# Patient Record
Sex: Female | Born: 1935 | Race: White | Hispanic: No | Smoking: Never smoker
Health system: Southern US, Community
[De-identification: ages and names within clinical notes are randomized; demographics above are authoritative.]

## PROBLEM LIST (undated history)

## (undated) DIAGNOSIS — J90 Pleural effusion, not elsewhere classified: Secondary | ICD-10-CM

## (undated) DIAGNOSIS — C50919 Malignant neoplasm of unspecified site of unspecified female breast: Secondary | ICD-10-CM

## (undated) DIAGNOSIS — E785 Hyperlipidemia, unspecified: Secondary | ICD-10-CM

## (undated) DIAGNOSIS — I10 Essential (primary) hypertension: Secondary | ICD-10-CM

## (undated) DIAGNOSIS — E039 Hypothyroidism, unspecified: Secondary | ICD-10-CM

## (undated) DIAGNOSIS — I4891 Unspecified atrial fibrillation: Secondary | ICD-10-CM

## (undated) DIAGNOSIS — I509 Heart failure, unspecified: Secondary | ICD-10-CM

## (undated) DIAGNOSIS — R911 Solitary pulmonary nodule: Secondary | ICD-10-CM

## (undated) HISTORY — PX: OTHER SURGICAL HISTORY: SHX169

## (undated) HISTORY — PX: JOINT REPLACEMENT: SHX530

## (undated) HISTORY — PX: FRACTURE SURGERY: SHX138

## (undated) HISTORY — DX: Hyperlipidemia, unspecified: E78.5

---

## 1997-09-01 HISTORY — PX: OTHER SURGICAL HISTORY: SHX169

## 1998-09-11 ENCOUNTER — Ambulatory Visit (HOSPITAL_COMMUNITY): Admission: RE | Admit: 1998-09-11 | Discharge: 1998-09-11 | Payer: Self-pay | Admitting: General Surgery

## 1998-09-12 ENCOUNTER — Encounter: Payer: Self-pay | Admitting: General Surgery

## 1998-09-13 ENCOUNTER — Inpatient Hospital Stay (HOSPITAL_COMMUNITY): Admission: RE | Admit: 1998-09-13 | Discharge: 1998-09-14 | Payer: Self-pay | Admitting: General Surgery

## 1998-10-19 ENCOUNTER — Encounter: Admission: RE | Admit: 1998-10-19 | Discharge: 1998-11-21 | Payer: Self-pay | Admitting: Hematology and Oncology

## 1998-10-20 ENCOUNTER — Encounter: Admission: RE | Admit: 1998-10-20 | Discharge: 1999-01-18 | Payer: Self-pay | Admitting: Radiation Oncology

## 1999-06-13 ENCOUNTER — Ambulatory Visit (HOSPITAL_COMMUNITY): Admission: RE | Admit: 1999-06-13 | Discharge: 1999-06-13 | Payer: Self-pay | Admitting: *Deleted

## 1999-06-13 ENCOUNTER — Encounter (INDEPENDENT_AMBULATORY_CARE_PROVIDER_SITE_OTHER): Payer: Self-pay | Admitting: Specialist

## 1999-08-06 ENCOUNTER — Encounter: Admission: RE | Admit: 1999-08-06 | Discharge: 1999-08-06 | Payer: Self-pay | Admitting: General Surgery

## 1999-08-06 ENCOUNTER — Encounter: Payer: Self-pay | Admitting: General Surgery

## 1999-11-19 ENCOUNTER — Encounter: Admission: RE | Admit: 1999-11-19 | Discharge: 1999-11-19 | Payer: Self-pay | Admitting: General Surgery

## 1999-11-19 ENCOUNTER — Encounter: Payer: Self-pay | Admitting: General Surgery

## 1999-11-20 ENCOUNTER — Encounter (INDEPENDENT_AMBULATORY_CARE_PROVIDER_SITE_OTHER): Payer: Self-pay | Admitting: *Deleted

## 1999-11-20 ENCOUNTER — Encounter: Payer: Self-pay | Admitting: General Surgery

## 1999-11-20 ENCOUNTER — Ambulatory Visit (HOSPITAL_BASED_OUTPATIENT_CLINIC_OR_DEPARTMENT_OTHER): Admission: RE | Admit: 1999-11-20 | Discharge: 1999-11-20 | Payer: Self-pay | Admitting: General Surgery

## 2000-03-13 ENCOUNTER — Encounter: Admission: RE | Admit: 2000-03-13 | Discharge: 2000-04-08 | Payer: Self-pay | Admitting: *Deleted

## 2000-08-26 ENCOUNTER — Encounter: Payer: Self-pay | Admitting: *Deleted

## 2000-08-26 ENCOUNTER — Encounter: Admission: RE | Admit: 2000-08-26 | Discharge: 2000-08-26 | Payer: Self-pay | Admitting: *Deleted

## 2001-12-30 ENCOUNTER — Encounter: Payer: Self-pay | Admitting: Obstetrics and Gynecology

## 2001-12-30 ENCOUNTER — Encounter: Admission: RE | Admit: 2001-12-30 | Discharge: 2001-12-30 | Payer: Self-pay | Admitting: Obstetrics and Gynecology

## 2001-12-31 ENCOUNTER — Ambulatory Visit (HOSPITAL_BASED_OUTPATIENT_CLINIC_OR_DEPARTMENT_OTHER): Admission: RE | Admit: 2001-12-31 | Discharge: 2001-12-31 | Payer: Self-pay | Admitting: Obstetrics and Gynecology

## 2001-12-31 ENCOUNTER — Encounter (INDEPENDENT_AMBULATORY_CARE_PROVIDER_SITE_OTHER): Payer: Self-pay

## 2002-07-20 ENCOUNTER — Encounter: Admission: RE | Admit: 2002-07-20 | Discharge: 2002-07-20 | Payer: Self-pay | Admitting: Family Medicine

## 2002-07-20 ENCOUNTER — Encounter: Payer: Self-pay | Admitting: Family Medicine

## 2002-08-02 ENCOUNTER — Ambulatory Visit (HOSPITAL_COMMUNITY): Admission: RE | Admit: 2002-08-02 | Discharge: 2002-08-02 | Payer: Self-pay | Admitting: *Deleted

## 2002-08-02 ENCOUNTER — Encounter (INDEPENDENT_AMBULATORY_CARE_PROVIDER_SITE_OTHER): Payer: Self-pay | Admitting: *Deleted

## 2002-10-04 ENCOUNTER — Encounter: Payer: Self-pay | Admitting: Family Medicine

## 2002-10-04 ENCOUNTER — Encounter: Admission: RE | Admit: 2002-10-04 | Discharge: 2002-10-04 | Payer: Self-pay | Admitting: Family Medicine

## 2003-02-07 ENCOUNTER — Encounter: Admission: RE | Admit: 2003-02-07 | Discharge: 2003-02-07 | Payer: Self-pay | Admitting: Family Medicine

## 2003-02-07 ENCOUNTER — Encounter: Payer: Self-pay | Admitting: Family Medicine

## 2003-02-28 ENCOUNTER — Inpatient Hospital Stay (HOSPITAL_COMMUNITY): Admission: RE | Admit: 2003-02-28 | Discharge: 2003-03-02 | Payer: Self-pay | Admitting: Cardiothoracic Surgery

## 2003-02-28 ENCOUNTER — Encounter: Payer: Self-pay | Admitting: Cardiothoracic Surgery

## 2003-03-01 ENCOUNTER — Encounter: Payer: Self-pay | Admitting: Cardiothoracic Surgery

## 2003-03-02 ENCOUNTER — Encounter: Payer: Self-pay | Admitting: Cardiothoracic Surgery

## 2003-03-07 ENCOUNTER — Ambulatory Visit (HOSPITAL_COMMUNITY): Admission: RE | Admit: 2003-03-07 | Discharge: 2003-03-07 | Payer: Self-pay | Admitting: Interventional Radiology

## 2003-06-16 ENCOUNTER — Encounter: Admission: RE | Admit: 2003-06-16 | Discharge: 2003-06-16 | Payer: Self-pay | Admitting: Cardiothoracic Surgery

## 2003-09-02 ENCOUNTER — Ambulatory Visit (HOSPITAL_COMMUNITY): Admission: RE | Admit: 2003-09-02 | Discharge: 2003-09-02 | Payer: Self-pay | Admitting: Cardiothoracic Surgery

## 2003-11-15 ENCOUNTER — Ambulatory Visit (HOSPITAL_COMMUNITY): Admission: RE | Admit: 2003-11-15 | Discharge: 2003-11-15 | Payer: Self-pay | Admitting: Oncology

## 2003-11-22 ENCOUNTER — Ambulatory Visit (HOSPITAL_COMMUNITY): Admission: RE | Admit: 2003-11-22 | Discharge: 2003-11-22 | Payer: Self-pay | Admitting: Oncology

## 2004-02-10 ENCOUNTER — Ambulatory Visit (HOSPITAL_COMMUNITY): Admission: RE | Admit: 2004-02-10 | Discharge: 2004-02-10 | Payer: Self-pay | Admitting: Oncology

## 2004-07-02 ENCOUNTER — Encounter: Admission: RE | Admit: 2004-07-02 | Discharge: 2004-07-02 | Payer: Self-pay | Admitting: Family Medicine

## 2004-08-06 ENCOUNTER — Ambulatory Visit: Payer: Self-pay | Admitting: Oncology

## 2004-09-10 ENCOUNTER — Ambulatory Visit (HOSPITAL_COMMUNITY): Admission: RE | Admit: 2004-09-10 | Discharge: 2004-09-10 | Payer: Self-pay | Admitting: Oncology

## 2004-11-02 ENCOUNTER — Ambulatory Visit: Payer: Self-pay | Admitting: Oncology

## 2004-12-03 ENCOUNTER — Ambulatory Visit (HOSPITAL_COMMUNITY): Admission: RE | Admit: 2004-12-03 | Discharge: 2004-12-03 | Payer: Self-pay | Admitting: Oncology

## 2005-03-01 ENCOUNTER — Ambulatory Visit: Payer: Self-pay | Admitting: Oncology

## 2005-05-31 ENCOUNTER — Ambulatory Visit: Payer: Self-pay | Admitting: Oncology

## 2005-08-19 ENCOUNTER — Ambulatory Visit (HOSPITAL_COMMUNITY): Admission: RE | Admit: 2005-08-19 | Discharge: 2005-08-19 | Payer: Self-pay | Admitting: Oncology

## 2005-09-02 ENCOUNTER — Ambulatory Visit: Payer: Self-pay | Admitting: Oncology

## 2005-10-07 ENCOUNTER — Ambulatory Visit (HOSPITAL_COMMUNITY): Admission: RE | Admit: 2005-10-07 | Discharge: 2005-10-07 | Payer: Self-pay | Admitting: Oncology

## 2006-01-24 ENCOUNTER — Ambulatory Visit: Payer: Self-pay | Admitting: Oncology

## 2006-05-27 ENCOUNTER — Ambulatory Visit: Payer: Self-pay | Admitting: Oncology

## 2006-05-29 ENCOUNTER — Ambulatory Visit (HOSPITAL_COMMUNITY): Admission: RE | Admit: 2006-05-29 | Discharge: 2006-05-29 | Payer: Self-pay | Admitting: Oncology

## 2006-05-29 LAB — CANCER ANTIGEN 27.29: CA 27.29: 46 U/mL — ABNORMAL HIGH (ref 0–39)

## 2006-11-27 ENCOUNTER — Ambulatory Visit: Payer: Self-pay | Admitting: Oncology

## 2007-05-27 ENCOUNTER — Ambulatory Visit (HOSPITAL_COMMUNITY): Admission: RE | Admit: 2007-05-27 | Discharge: 2007-05-27 | Payer: Self-pay | Admitting: Oncology

## 2007-05-28 ENCOUNTER — Ambulatory Visit: Payer: Self-pay | Admitting: Oncology

## 2007-09-22 ENCOUNTER — Encounter: Admission: RE | Admit: 2007-09-22 | Discharge: 2007-09-22 | Payer: Self-pay | Admitting: Family Medicine

## 2007-09-24 ENCOUNTER — Ambulatory Visit: Payer: Self-pay | Admitting: Oncology

## 2007-10-12 ENCOUNTER — Inpatient Hospital Stay (HOSPITAL_COMMUNITY): Admission: AD | Admit: 2007-10-12 | Discharge: 2007-10-16 | Payer: Self-pay | Admitting: Internal Medicine

## 2007-11-24 ENCOUNTER — Ambulatory Visit (HOSPITAL_COMMUNITY): Admission: RE | Admit: 2007-11-24 | Discharge: 2007-11-24 | Payer: Self-pay | Admitting: Family Medicine

## 2007-12-09 ENCOUNTER — Ambulatory Visit: Payer: Self-pay | Admitting: Oncology

## 2008-06-03 ENCOUNTER — Encounter: Admission: RE | Admit: 2008-06-03 | Discharge: 2008-06-03 | Payer: Self-pay | Admitting: Family Medicine

## 2008-06-09 ENCOUNTER — Ambulatory Visit: Payer: Self-pay | Admitting: Oncology

## 2008-12-08 ENCOUNTER — Ambulatory Visit: Payer: Self-pay | Admitting: Oncology

## 2009-06-09 ENCOUNTER — Ambulatory Visit: Payer: Self-pay | Admitting: Oncology

## 2009-11-27 ENCOUNTER — Emergency Department (HOSPITAL_BASED_OUTPATIENT_CLINIC_OR_DEPARTMENT_OTHER): Admission: EM | Admit: 2009-11-27 | Discharge: 2009-11-27 | Payer: Self-pay | Admitting: Emergency Medicine

## 2009-12-22 ENCOUNTER — Ambulatory Visit: Payer: Self-pay | Admitting: Oncology

## 2009-12-26 LAB — CANCER ANTIGEN 27.29: CA 27.29: 22 U/mL (ref 0–39)

## 2010-08-18 ENCOUNTER — Encounter: Payer: Self-pay | Admitting: Cardiothoracic Surgery

## 2010-09-25 ENCOUNTER — Encounter (HOSPITAL_BASED_OUTPATIENT_CLINIC_OR_DEPARTMENT_OTHER): Payer: Medicare Other | Admitting: Oncology

## 2010-09-25 DIAGNOSIS — C50919 Malignant neoplasm of unspecified site of unspecified female breast: Secondary | ICD-10-CM

## 2010-12-11 NOTE — H&P (Signed)
NAME:  Leah Guerra, Leah Guerra NO.:  [id number]   MEDICAL RECORD NO.:  [id number]          PATIENT TYPE:  INP   LOCATION:  1613                         FACILITY:  [facility]   PHYSICIAN:  Ramiro Harvest, MD    DATE OF BIRTH:  1925-05-27   DATE OF ADMISSION:  10/12/2007  DATE OF DISCHARGE:                              HISTORY & PHYSICAL   PRIMARY CARE PHYSICIAN:  Dr. Abigail Miyamoto of [facility].   ONCOLOGIST:  Leighton Roach Truett Perna, M.D.   HISTORY OF PRESENT ILLNESS:  Leah Guerra is an 77 year old  white female, history of left breast cancer status post mastectomy and  radiation therapy, was on Tamoxifen and Femara, presented to urgent  care, one-week history of productive worsening cough of yellowish  sputum, chills, fever, dyspnea on exertion, occasional headache.  The  patient denies chest pain, no nausea, no vomiting, occasional  diaphoresis, positive sick contact.  The patient was seen at Urgent  Care, found to have a white count of 20.8, chest x-ray with a large  right base infiltrate, concerning for pneumonia or metastatic disease.  The patient was sent to the hospital for further evaluation and  management.   ALLERGIES:  No known drug allergies.   PAST MEDICAL HISTORY:  1. History of left breast cancer diagnosed in 2003, status post      mastectomy, radiation therapy, was on Tamoxifen x5 years and      Femara.  2. History of endometrial polyp,  mucous myoma.  3. History of congestive heart failure.  4. Hypertension.  5. Atrial fibrillation.  6. Hypothyroidism.  7. Osteoporosis.  8. Hyperlipidemia.   HOME MEDICATIONS:  1. Levothyroxine 50 mcg p.o. q. daily.  2. Lipitor 40 mg daily.  3. Zolpidem or Ambien 5 mg q.h.s.  4. Actonel 35 mg q.weekly.  5. Digitek 125 mcg daily.  6. Diltiazem 240 mg daily.  7. Enalapril 5 mg b.i.d.  8. Lasix 40 mg daily.  9. Zetia0 10 mg daily.  10.Vitamin D 400 I.U.s, two tabs daily.  11.Calcium 600 mg, two tabs  daily.   SOCIAL HISTORY:  The patient lives in [place] with her husband.  No  tobacco, no alcohol, no IV drug use.  The patient has 3 children, a  daughter with a questionable liver disease.   FAMILY HISTORY:  Mother deceased, age 49, from Parkinson's.  Father  decreased, age 72s, from COPD.  One brother decreased in his 58s, cause  unknown.   REVIEW OF SYSTEMS:  Per HPI, otherwise negative.   PHYSICAL EXAMINATION:  VITAL SIGNS:  Temperature 101.5, pulse of 86,  blood pressure 118/57, respiratory rate 20, satting 94% on room air.  GENERAL:  The patient is laying in bed, no apparent distress.  HEENT:  Normocephalic, atraumatic.  Pupils equal, round, and reactive to  light.  Extraocular movements intact.  Oropharynx is clear, no lesions.  NECK:  Supple, no lymphadenopathy.  RESPIRATORY:  Right-sided rhonchi and expiratory wheezes.  CARDIOVASCULAR:  Regular rate and rhythm.  No murmurs, rubs or gallops.  ABDOMEN:  Soft, nontender, nondistended.  Positive bowel sounds.  EXTREMITIES:  No clubbing, cyanosis or edema.  NEUROLOGICAL:  The patient is alert and oriented x3.  Cranial nerves 2-  12 are grossly intact.  No focal deficits.   LABORATORY:  From Urgent Care:  CBC 20, white count 20.8, hemoglobin  12.8, platelets 287, ANC 17.3, CBG of 123.  Chest x-ray showed a large  right base infiltrate concerning for a large pneumonia metastatic  disease.   ASSESSMENT/PLAN:  Leah Guerra is a 77 year old female, history  of left breast cancer status post mastectomy, who presents with a one-  week history of worsening cough, dyspnea on exertion, fever and chills  and found to have a chest x-ray concerning for an infiltrate versus  metastatic disease.  PROBLEM:  1. Probable right-sided and pneumonia.  Patient with fever, elevated      white count, chest x-ray findings consistent with a pneumonia      concerning for possible metastatic disease.  Will check serum gram      stain and  culture, blood cultures x2, UA with culture and      sensitivity.  Check a urine strep pneumococcus and legionella      antigen, IV fluids, Nasonex 600 mg b.i.d., Rocephin and      azithromycin, empiric antibiotics until culture results return.      Place patient on oxygen.  Will check a CT to rule out neoplasm      follow.  2. Hypothyroidism.  Check a TSH levothyroxine.  3. Congestive heart failure.  Check a BNP.  Hold Lasix.  4. Hypertension.  5. Enalapril 5 mg b.i.d.  6. Atrial fibrillation.  Continue Dilitazem and Digitek for rate      control.  7. Hyperlipidemia.  Chest a fasting lipid panel.  Continue home dose      Lipitor and Zetia.  8. History of breast cancer.  9. Osteoporosis.  10.Prophylaxis, Protonix with GI prophylaxis, SCDs for DVT      prophylaxis.   It was a pleasure taking care of Ms. Loy.      Ramiro Harvest, MD  Electronically Signed     DT/MEDQ  D:  10/12/2007  T:  10/12/2007  Job:  [id number]   cc:   Chales Salmon. Abigail Miyamoto, M.D.  Fax: [phone]   Leighton Roach Truett Perna, M.D.  Fax: [phone]

## 2010-12-11 NOTE — Discharge Summary (Signed)
NAME:  Leah Guerra, Leah Guerra       ACCOUNT NO.:  [id number]   MEDICAL RECORD NO.:  [id number]          PATIENT TYPE:  INP   LOCATION:  1613                         FACILITY:  [facility]   PHYSICIAN:  Hollice Espy, M.D.DATE OF BIRTH:  1925-04-06   DATE OF ADMISSION:  10/12/2007  DATE OF DISCHARGE:                               DISCHARGE SUMMARY   PRIMARY CARE PHYSICIAN:  Chales Salmon. Abigail Miyamoto, M.D.   CHIEF COMPLAINT:  Shortness of breath.   ONCOLOGIST:  Leighton Roach Truett Perna, M.D.   DISCHARGE DIAGNOSES:  1. Right lower lobe pneumonia.  2. Hypothyroidism.  3. Bilateral pleural effusions  4. Hypertension.  5. Atrial fibrillation.  6. Hyperlipidemia.  7. History of breast cancer.   DISCHARGE MEDICATIONS:  The patient will continue all of her previous  medications:  1. Actonel 35 weekly.  2. Digitek 0.125 mg p.o. daily.  3. Cardizem 240 mg p.o. daily.  4. Enalapril 5 mg p.o. b.i.d.  5. Femara 2.5 p.o. daily.  6. Lasix 40 p.o. daily.  7. Synthroid 50 mcg p.o. daily.  8. Lipitor 40 p.o. daily.  9. Zetia 10 p.o. daily.  10.Vitamin D 400 mg 2 tablets p.o. daily.  11.Calcium D 1200 mg p.o. daily.  12.Ambien 5 mg p.o. q.h.s.  13.Avelox 400 mg 1 p.o. daily x3 days (this is a new medication).   HOSPITAL COURSE:  The patient is an 77 year old white female with past  medical history of breast cancer status post mastectomy and radiation  therapy who presented on October 12, 2007 with worsening cough, fever,  chills and sputum.  She was found to have a white count of 20.8.  Chest  x-ray showed an large right base infiltrate concerning for pneumonia  versus metastatic disease.  The patient came to the emergency room.  She  was started on IV antibiotics and supplemental oxygen and was admitted  for further treatment.   On hospital day #2, she underwent a CT scan of the chest which showed a  new right lower lobe infiltrate versus pneumonia, stable bilateral  apical scarring and consolidation,  mild mediastinal adenopathy, and no  evidence of any metastatic disease was noted.  With these findings, it  was felt that the patient had a pneumonia.  She was started on  antibiotics, and by hospital day #2 her white count had decreased down  to 15.4.  By hospital day #3 it was down to 11.8.  The patient continued  to have some episodes of soreness and poor inspiratory effort.  She was  started on inspirometer, and then she was only able to take inspirations  and inspiratory effort of about 500 mL.  She continued to working with  the inspirometer daily, and by October 16, 2007 she was able to go up to  about 1500 mL.  In the interim, she has been able to ambulate well.  The  plan will be for discharge home.  A followup x-ray done on October 15, 2007 showed evidence of mild bilateral pleural effusions.  The patient's  Lasix had been held initially secondary to dehydration and volume  depletion status.  This improved with IV fluids.  Her IV fluids were  discontinued.  She was resumed on Lasix, and she was feeling better by  October 16, 2007.  She was felt to be medically stable to be discharged  home.  She will be discharged home on three more days of Avelox to  complete 7 days of antibiotic therapy.   DISCHARGE DIET:  A heart-healthy diet.   ACTIVITY:  Her activity will be slow to increase.   FOLLOW UP:  She will have a followup appointment with Dr. Abigail Miyamoto.   CONDITION ON DISCHARGE:  Her overall disposition is improved, and she  will be discharged to home.   I will defer to Dr. Abigail Miyamoto about checking a followup chest x-ray upon  followup appointment.  The patient will be discharged to home with an  inspirometer and recommended continued use.   In regards to her atrial fibrillation, this remained stable during her  hospitalization.  She was continued on Digitek and Cardizem.      Hollice Espy, M.D.  Electronically Signed     SKK/MEDQ  D:  10/16/2007  T:  10/16/2007  Job:   [id number]   cc:   Chales Salmon. Abigail Miyamoto, M.D.  Fax: [phone]   Leighton Roach Truett Perna, M.D.  Fax: [phone]

## 2010-12-14 NOTE — Op Note (Signed)
[facility]  Patient:    ALLINA, RICHES Visit Number: [id number] MRN: [id number]          Service Type: NES Location: [facility] Attending Physician:  Lendon Colonel Dictated by:   Kathie Rhodes. Kyra Manges, M.D. Proc. Date: 12/31/01 Admit Date:  12/31/2001                             Operative Report  PREOPERATIVE DIAGNOSES:  Postmenopausal bleeding on tamoxifen, history of endometrial polyp on endometrial biopsy.  POSTOPERATIVE DIAGNOSES:  Endometrial polyp and small submucous myoma.  OPERATION PERFORMED:  Examination under anesthesia, hysteroscopy with resection of submucous myoma and small endometrial benign appearing polyps.  DESCRIPTION OF PROCEDURE:  The patient was placed in lithotomy position, prepped and draped in the usual fashion. Examination revealed an anterior uterus that was slight enlarged, no adnexal masses were noted. Carefully the cervix was grasped and dilated, the endometrial cavity was visualized. There was a cervical submucous myoma which was resected very carefully and the endometrial cavity was resected in a 360 degree manner. No unusual blood loss occurred. All of the resected material was sent to the lab for study. The patient tolerated the procedure well and was sent to the recovery room in good condition. Dictated by:   S. Kyra Manges, M.D. Attending Physician:  Lendon Colonel DD:  12/31/01 TD:  01/02/02 Job: [id number]

## 2011-04-22 LAB — EXPECTORATED SPUTUM ASSESSMENT W GRAM STAIN, RFLX TO RESP C

## 2011-04-22 LAB — CBC
HCT: 34.4 — ABNORMAL LOW
Hemoglobin: 11.4 — ABNORMAL LOW
MCHC: 33
MCV: 90
MCV: 90.6
RBC: 3.67 — ABNORMAL LOW
RBC: 3.79 — ABNORMAL LOW
WBC: 11.8 — ABNORMAL HIGH
WBC: 15.4 — ABNORMAL HIGH

## 2011-04-22 LAB — COMPREHENSIVE METABOLIC PANEL
AST: 34
Albumin: 2.7 — ABNORMAL LOW
Chloride: 98
Creatinine, Ser: 1.21 — ABNORMAL HIGH
GFR calc Af Amer: 52 — ABNORMAL LOW
GFR calc non Af Amer: 43 — ABNORMAL LOW
Sodium: 135
Total Protein: 6.8

## 2011-04-22 LAB — DIFFERENTIAL
Basophils Relative: 0
Eosinophils Absolute: 0
Eosinophils Absolute: 0
Lymphocytes Relative: 10 — ABNORMAL LOW
Lymphs Abs: 1.1
Lymphs Abs: 1.5
Monocytes Absolute: 0.9
Monocytes Relative: 7
Monocytes Relative: 8
Neutrophils Relative %: 82 — ABNORMAL HIGH

## 2011-04-22 LAB — BASIC METABOLIC PANEL
CO2: 25
Chloride: 103
Chloride: 108
Creatinine, Ser: 0.88
Creatinine, Ser: 1.13
GFR calc Af Amer: 56 — ABNORMAL LOW
GFR calc Af Amer: >60
GFR calc non Af Amer: 46 — ABNORMAL LOW
Potassium: 3.3 — ABNORMAL LOW
Potassium: 4
Sodium: 143

## 2011-04-22 LAB — GRAM STAIN

## 2011-04-22 LAB — LEGIONELLA ANTIGEN, URINE: Legionella Antigen, Urine: NEGATIVE

## 2011-04-22 LAB — CULTURE, RESPIRATORY W GRAM STAIN

## 2011-04-22 LAB — CULTURE, BLOOD (ROUTINE X 2)

## 2011-04-22 LAB — URINALYSIS, ROUTINE W REFLEX MICROSCOPIC
Ketones, ur: NEGATIVE
Leukocytes, UA: NEGATIVE
Nitrite: NEGATIVE
Protein, ur: 100 — AB

## 2011-04-22 LAB — PROTIME-INR
INR: 1.1
Prothrombin Time: 14.7

## 2011-04-22 LAB — URINE CULTURE

## 2011-06-01 ENCOUNTER — Telehealth: Payer: Self-pay | Admitting: Oncology

## 2011-06-01 NOTE — Telephone Encounter (Signed)
lmonvm advising the pt that her nov 20th appt had to be cancelled due to the new epic emr sysytem and we need to r/s the appt for jan 2013. Asked that the pt call back to r/s the appt.

## 2011-06-04 ENCOUNTER — Telehealth: Payer: Self-pay | Admitting: Oncology

## 2011-06-04 NOTE — Telephone Encounter (Signed)
Pt called in to schedule her jan 2013 appt with dr Truett Perna

## 2011-08-08 ENCOUNTER — Ambulatory Visit (HOSPITAL_BASED_OUTPATIENT_CLINIC_OR_DEPARTMENT_OTHER): Payer: Medicare Other | Admitting: Oncology

## 2011-08-08 ENCOUNTER — Telehealth: Payer: Self-pay | Admitting: Oncology

## 2011-08-08 VITALS — BP 114/68 | HR 86 | Temp 97.5°F | Ht 63.5 in | Wt 118.7 lb

## 2011-08-08 DIAGNOSIS — C50919 Malignant neoplasm of unspecified site of unspecified female breast: Secondary | ICD-10-CM

## 2011-08-08 NOTE — Telephone Encounter (Signed)
lmonvm adviisng the pt of her July 2013 appt °

## 2011-08-08 NOTE — Progress Notes (Signed)
OFFICE PROGRESS NOTE   INTERVAL HISTORY:   She returns as scheduled. There's been no change at the chest wall. She reports malaise after taking care of her ill husband. She complains of mild exertional dyspnea for the past few months. No cough. A mammogram was negative on 12/27/2010.  Objective:  Vital signs in last 24 hours:  Blood pressure 114/68, pulse 86, temperature 97.5 F (36.4 C), temperature source Oral, height 5' 3.5" (1.613 m), weight 118 lb 11.2 oz (53.842 kg).    HEENT: Neck without mass Lymphatics: No cervical, supraclavicular, or axillary nodes Resp: Lungs clear bilaterally. No respiratory distress Cardio: Irregular GI: No hepatomegaly Vascular: No leg edema  Breasts: Status post left mastectomy. No evidence for chest wall tumor recurrence. Right breast without mass.     Medications: I have reviewed the patient's current medications.  Assessment/Plan: 1. Stage III-A left-sided breast cancer diagnosed in February of 2000. She was maintained on Femara from February 2005 through November 2008.  There is no clinical evidence for recurrent breast cancer. 2. History of chronic parenchymal lung densities and a borderline elevated CA27.29.  Multiple CT scans were negative for evidence of disease progression.  She last underwent a CT of the chest in November of 2009.     Disposition:  She remains in clinical remission from breast cancer. She will contact us or her primary physician if the exertional dyspnea persists. She will return for an office visit in 6 months.   Lucile Shutters, MD  08/08/2011  3:53 PM

## 2012-02-11 ENCOUNTER — Ambulatory Visit (HOSPITAL_BASED_OUTPATIENT_CLINIC_OR_DEPARTMENT_OTHER): Payer: Medicare Other | Admitting: Oncology

## 2012-02-11 ENCOUNTER — Telehealth: Payer: Self-pay | Admitting: Oncology

## 2012-02-11 VITALS — BP 115/69 | HR 69 | Temp 97.3°F | Ht 63.5 in | Wt 132.0 lb

## 2012-02-11 DIAGNOSIS — C50919 Malignant neoplasm of unspecified site of unspecified female breast: Secondary | ICD-10-CM

## 2012-02-11 NOTE — Telephone Encounter (Signed)
gve the pt her sept 2013 appt calendar °

## 2012-02-11 NOTE — Progress Notes (Signed)
   [facility]    OFFICE PROGRESS NOTE   INTERVAL HISTORY:   She returns as scheduled. She has a good appetite. She reports mild balance difficulty with walking. No focal weakness. No consistent cough. Mild exertional dyspnea. No change over the chest wall. A mammogram was negative in May of 2012.  Objective:  Vital signs in last 24 hours:  Blood pressure 115/69, pulse 69, temperature 97.3 F (36.3 C), temperature source Oral, height 5' 3.5" (1.613 m), weight 132 lb (59.875 kg), SpO2 96.00%.    HEENT: Neck without mass Lymphatics: 1 cm mobile right axillary node with additional "shotty "right axillary nodes, no cervical, supraclavicular, or left axillary nodes Resp: Lungs clear bilaterally Cardio: Irregular GI: No hepatomegaly Vascular: No leg edema Neuro: Alert and oriented, she ambulates without difficulty. Finger to nose testing is normal. Mild difficulty with heel-to-toe walking  Breasts: Status post left mastectomy. No evidence for chest wall tumor recurrence. Right breast without mass.     Medications: I have reviewed the patient's current medications.  Assessment/Plan: 1. Stage III-A left-sided breast cancer diagnosed in February of 2000. She was maintained on Femara from February 2005 through November 2008.  There is no clinical evidence for recurrent breast cancer.  2.   History of chronic parenchymal lung densities and a borderline elevated CA27.29.  Multiple CT scans were negative for evidence of disease progression.  She last underwent a CT of the chest in November of 2009   Disposition:  She remains in clinical remission from breast cancer. We decided against further imaging studies unless she develops clinical evidence of disease progression. She will followup with Dr. Tenny Craw if the balance difficulty persists. Ms. Stierwalt will return for an office visit in 6 months.   Thornton Papas, MD  02/11/2012  3:47 PM

## 2012-06-30 ENCOUNTER — Other Ambulatory Visit (HOSPITAL_COMMUNITY): Payer: Self-pay | Admitting: Cardiovascular Disease

## 2012-06-30 DIAGNOSIS — R079 Chest pain, unspecified: Secondary | ICD-10-CM

## 2012-06-30 DIAGNOSIS — R0602 Shortness of breath: Secondary | ICD-10-CM

## 2012-07-01 ENCOUNTER — Ambulatory Visit
Admission: RE | Admit: 2012-07-01 | Discharge: 2012-07-01 | Disposition: A | Discharge status: Home / Self Care | Payer: Medicare Other | Source: Ambulatory Visit | Attending: Cardiovascular Disease | Admitting: Cardiovascular Disease

## 2012-07-01 ENCOUNTER — Other Ambulatory Visit: Payer: Self-pay | Admitting: Cardiovascular Disease

## 2012-07-01 DIAGNOSIS — R079 Chest pain, unspecified: Secondary | ICD-10-CM

## 2012-07-01 DIAGNOSIS — R0602 Shortness of breath: Secondary | ICD-10-CM

## 2012-07-17 ENCOUNTER — Ambulatory Visit (HOSPITAL_COMMUNITY)
Admission: RE | Admit: 2012-07-17 | Discharge: 2012-07-17 | Disposition: A | Discharge status: Home / Self Care | Payer: Medicare Other | Source: Ambulatory Visit | Attending: Cardiovascular Disease | Admitting: Cardiovascular Disease

## 2012-07-17 DIAGNOSIS — R0602 Shortness of breath: Secondary | ICD-10-CM

## 2012-07-17 DIAGNOSIS — R0989 Other specified symptoms and signs involving the circulatory and respiratory systems: Secondary | ICD-10-CM | POA: Insufficient documentation

## 2012-07-17 DIAGNOSIS — R0609 Other forms of dyspnea: Secondary | ICD-10-CM | POA: Insufficient documentation

## 2012-07-17 DIAGNOSIS — R079 Chest pain, unspecified: Secondary | ICD-10-CM

## 2012-07-17 DIAGNOSIS — E785 Hyperlipidemia, unspecified: Secondary | ICD-10-CM | POA: Insufficient documentation

## 2012-07-17 DIAGNOSIS — R5381 Other malaise: Secondary | ICD-10-CM | POA: Insufficient documentation

## 2012-07-17 DIAGNOSIS — I10 Essential (primary) hypertension: Secondary | ICD-10-CM | POA: Insufficient documentation

## 2012-07-17 HISTORY — PX: CARDIOVASCULAR STRESS TEST: SHX262

## 2012-07-17 HISTORY — PX: DOPPLER ECHOCARDIOGRAPHY: SHX263

## 2012-07-17 MED ORDER — REGADENOSON 0.4 MG/5ML IV SOLN
0.4000 mg | Freq: Once | INTRAVENOUS | Status: AC
  Administered 2012-07-17: 0.4 mg via INTRAVENOUS

## 2012-07-17 MED ORDER — TECHNETIUM TC 99M SESTAMIBI GENERIC - CARDIOLITE
30.4000 | Freq: Once | INTRAVENOUS | Status: AC | PRN
  Administered 2012-07-17: 30 via INTRAVENOUS

## 2012-07-17 MED ORDER — TECHNETIUM TC 99M SESTAMIBI GENERIC - CARDIOLITE
10.4000 | Freq: Once | INTRAVENOUS | Status: AC | PRN
  Administered 2012-07-17: 10 via INTRAVENOUS

## 2012-07-17 NOTE — Procedures (Addendum)
[name] [address] [place] [address] [phone]  Cardiology Nuclear Med Study  Leah Guerra is a 78 y.o. female     MRN : [id number]     DOB: 1925/07/12  Procedure Date: 07/17/2012  Nuclear Med Background Indication for Stress Test:  Evaluation for Ischemia and Abnormal EKG History: No known prior history of CAD, AFIB Cardiac Risk Factors: Hypertension and Lipids  Symptoms:  Chest Pain/Pressure, DOE, Fatigue and SOB   Nuclear Pre-Procedure Caffeine/Decaff Intake:  7:30pm NPO After: 5:30am   IV Site: R Forearm x 1, tolerated well IV 0.9% NS with Angio Cath:  22g  Chest Size (in):  34, history of (L) Mastectomy IV Started by: Irean Hong, RN  Height: 5\' 4"  (1.626 m)  Cup Size: A  BMI:  Body mass index is 25.23 kg/(m^2). Weight:  147 lb (66.679 kg)   Tech Comments:  n/a    Nuclear Med Study 1 or 2 day study: 1 day  Stress Test Type:  Lexiscan  Order Authorizing Provider:  Nanetta Batty, MD   Resting Radionuclide: Technetium 73m Sestamibi  Resting Radionuclide Dose: 10.4 mCi   Stress Radionuclide:  Technetium 77m Sestamibi  Stress Radionuclide Dose: 30.4 mCi           Stress Protocol Rest HR: 76 Stress HR:96  Rest BP: 127/95 Stress BP: 136/80  Exercise Time (min): n/a METS: n/a          Dose of Adenosine (mg):  n/a Dose of Lexiscan: 0.4 mg  Dose of Atropine (mg): n/a Dose of Dobutamine: n/a mcg/kg/min (at max HR)  Stress Test Technologist: Ernestene Mention, CCT Nuclear Technologist: Gonzella Lex, CNMT   Rest Procedure:  Myocardial perfusion imaging was performed at rest 45 minutes following the intravenous administration of Technetium 4m Sestamibi. Stress Procedure:  The patient received IV Lexiscan 0.4 mg over 15-seconds.  Technetium 94m Sestamibi injected at 30-seconds.  There were no significant changes with Lexiscan.  Quantitative spect images were obtained after a 45 minute  delay.  Transient Ischemic Dilatation (Normal <1.22):  1.01 Lung/Heart Ratio (Normal <0.45):  0.27 QGS EDV:   ml QGS ESV:   ml LV Ejection Fraction: Study not gated        Rest ECG: Atrial Fibrilliation  Stress ECG: No significant change from baseline ECG  QPS Raw Data Images:  Normal; no motion artifact; normal heart/lung ratio. Stress Images:  Normal homogeneous uptake in all areas of the myocardium. Rest Images:  Normal homogeneous uptake in all areas of the myocardium. Subtraction (SDS):  No evidence of ischemia.  Impression Exercise Capacity:  Lexiscan with no exercise. BP Response:  Normal blood pressure response. Clinical Symptoms:  No significant symptoms noted. ECG Impression:  No significant ST segment change suggestive of ischemia. Comparison with Prior Nuclear Study: No significant change from previous study  Overall Impression:  Normal stress nuclear study.  LV Wall Motion:  No gating secondary to AFIB   [name], MD  07/17/2012 2:28 PM

## 2012-07-17 NOTE — Progress Notes (Signed)
[facility]   2D echo completed 07/17/2012.   Cindy Pacey Willadsen, RDCS   

## 2012-07-27 ENCOUNTER — Other Ambulatory Visit: Payer: Self-pay | Admitting: Cardiovascular Disease

## 2012-07-27 DIAGNOSIS — R918 Other nonspecific abnormal finding of lung field: Secondary | ICD-10-CM

## 2012-07-28 ENCOUNTER — Ambulatory Visit
Admission: RE | Admit: 2012-07-28 | Discharge: 2012-07-28 | Disposition: A | Discharge status: Home / Self Care | Payer: Medicare Other | Source: Ambulatory Visit | Attending: Cardiovascular Disease | Admitting: Cardiovascular Disease

## 2012-07-28 DIAGNOSIS — R918 Other nonspecific abnormal finding of lung field: Secondary | ICD-10-CM

## 2012-07-28 MED ORDER — IOHEXOL 300 MG/ML  SOLN
75.0000 mL | Freq: Once | INTRAMUSCULAR | Status: AC | PRN
  Administered 2012-07-28: 75 mL via INTRAVENOUS

## 2012-08-14 ENCOUNTER — Telehealth: Payer: Self-pay | Admitting: Oncology

## 2012-08-14 NOTE — Telephone Encounter (Signed)
Talked to patient's daughter r/s appt for 09/22/12 from 08/18/12,MD only, nurse notified

## 2012-08-17 ENCOUNTER — Ambulatory Visit: Payer: Medicare Other | Admitting: Oncology

## 2012-09-22 ENCOUNTER — Ambulatory Visit (HOSPITAL_BASED_OUTPATIENT_CLINIC_OR_DEPARTMENT_OTHER): Payer: Medicare Other | Admitting: Oncology

## 2012-09-22 VITALS — BP 127/63 | HR 72 | Temp 97.4°F | Resp 18 | Ht 64.0 in | Wt 152.6 lb

## 2012-09-22 DIAGNOSIS — C50919 Malignant neoplasm of unspecified site of unspecified female breast: Secondary | ICD-10-CM

## 2012-09-22 DIAGNOSIS — Z853 Personal history of malignant neoplasm of breast: Secondary | ICD-10-CM

## 2012-09-22 NOTE — Progress Notes (Signed)
   Leah Guerra    OFFICE PROGRESS NOTE   INTERVAL HISTORY:   She returns for scheduled visit. She lost a significant amount of weight surrounding the death of her husband. She has regained weight. Mild exertional dyspnea. No significant cough. No change over the chest wall.  Her cardiologist obtained a chest x-ray in December 2013 for evaluation of dyspnea. One or 2 nodular opacities were noted at the right lung base. She was referred for a CT of the chest on 07/28/2012. The heart was noted to be mildly enlarged. No pathologically enlarged mediastinal or hilar nodes. A 1.5 cm nodule was slightly spiculated margins was noted in the right lower lobe. Stable scarring in the apices. Compared to a CT from November 2009 there is a new pleural-based low attenuation collection favored to represent a loculated pleural effusion. New small left pleural effusion and trace right pleural effusion.  Objective:  Vital signs in last 24 hours:  Blood pressure 127/63, pulse 72, temperature 97.4 F (36.3 C), temperature source Oral, resp. rate 18, height 5\' 4"  (1.626 m), weight 152 lb 9.6 oz (69.219 kg).    HEENT: Neck without mass Lymphatics: No cervical or supraclavicular nodes.? 1/2 cm-1 cm mobile right axillary node Resp: Lungs clear bilaterally Cardio: Regular rate and rhythm GI: No hepatomegaly Vascular: Trace low leg edema Breasts: Right breast without mass. Status post left mastectomy. No evidence for chest wall tumor recurrence     X-rays: I reviewed the chest CT from 07/28/2012-there is a slightly nodular lesion in the right lower lobe-? Inflammatory versus a new nodule.   Medications: I have reviewed the patient's current medications.  Assessment/Plan: 1. Stage III-A left-sided breast cancer diagnosed in February of 2000. She was maintained on Femara from February 2005 through November 2008. There is no clinical evidence for recurrent breast cancer. 2. History of chronic  parenchymal lung densities and a borderline elevated CA27.29. Multiple CT scans have been negative for evidence of disease progression. She last underwent a CT of the chest in November of 2009 prior to the CT in December of 2013.   Disposition:  She remains in clinical remission from breast cancer. The exertional dyspnea may be related to cardiac disease. I suspect the CT scan findings are related to chronic inflammatory lung disease and scarring. I have a low clinical suspicion for recurrent breast cancer or development of a new lung cancer in this patient with no smoking history.  I will review the CT with a radiologist and decide on the indication for a followup CT scan. Leah Guerra will return for an office visit in 6 months.  She plans to discuss the weight gain with Dr. Tenny Guerra.   Leah Papas, MD  09/22/2012  4:04 PM

## 2012-09-23 ENCOUNTER — Telehealth: Payer: Self-pay | Admitting: Oncology

## 2012-09-23 NOTE — Telephone Encounter (Signed)
lvm for opt regarding Aug appt...mailed appt schedule to pt

## 2012-10-13 ENCOUNTER — Other Ambulatory Visit: Payer: Self-pay | Admitting: *Deleted

## 2012-10-13 ENCOUNTER — Telehealth: Payer: Self-pay | Admitting: *Deleted

## 2012-10-13 DIAGNOSIS — C50919 Malignant neoplasm of unspecified site of unspecified female breast: Secondary | ICD-10-CM

## 2012-10-13 NOTE — Telephone Encounter (Signed)
Made patient aware that MD looked at last CT film with radiologist and noted that pulmonary nodules have been present on and off for years. One is slightly larger. Suggests repeat CT scan 6 months from last to follow. She understands and agrees. Per Dr. Truett Perna: Will scan in July and move August visit to to July after scan.

## 2012-10-15 ENCOUNTER — Ambulatory Visit: Payer: Self-pay | Admitting: Cardiovascular Disease

## 2012-10-15 DIAGNOSIS — I482 Chronic atrial fibrillation, unspecified: Secondary | ICD-10-CM | POA: Insufficient documentation

## 2012-10-15 DIAGNOSIS — I4891 Unspecified atrial fibrillation: Secondary | ICD-10-CM

## 2012-10-15 DIAGNOSIS — Z7901 Long term (current) use of anticoagulants: Secondary | ICD-10-CM | POA: Insufficient documentation

## 2012-10-16 ENCOUNTER — Telehealth: Payer: Self-pay | Admitting: Oncology

## 2012-10-16 NOTE — Telephone Encounter (Signed)
S/w the pt and she is aware of her r/s aug md appt to July to follow the ct scan appt. Went over each appt with the pt.

## 2012-11-23 ENCOUNTER — Encounter: Payer: Self-pay | Admitting: Cardiovascular Disease

## 2012-12-16 ENCOUNTER — Ambulatory Visit: Payer: Medicare Other | Admitting: Pharmacist Clinician (PhC)/ Clinical Pharmacy Specialist

## 2012-12-22 ENCOUNTER — Ambulatory Visit: Payer: Medicare Other | Admitting: Pharmacist Clinician (PhC)/ Clinical Pharmacy Specialist

## 2012-12-23 ENCOUNTER — Ambulatory Visit: Payer: Medicare Other | Admitting: Pharmacist Clinician (PhC)/ Clinical Pharmacy Specialist

## 2012-12-29 ENCOUNTER — Ambulatory Visit: Payer: Medicare Other | Admitting: Pharmacist Clinician (PhC)/ Clinical Pharmacy Specialist

## 2013-01-21 ENCOUNTER — Telehealth: Payer: Self-pay | Admitting: *Deleted

## 2013-01-21 NOTE — Telephone Encounter (Signed)
Patient fell while in [place] and fractured her hip. Was in hospital, then rehab and now back in hospital. Hoping to get her discharged and back home this week. Reports "cancer cells" were found when fluid drained from chest. Scheduled for CT scan on 7/7, but MD in [place] said she needs PET scan instead of CT (has already had plenty of CT scans there). She is asking what to do? Called back and left her voice mail to get release signed and have copies of CT scans and cytology from thoracentesis faxed to our office (gave our fax #). Also suggested she get her scans put on CD and bring to [place] with her.

## 2013-01-26 ENCOUNTER — Telehealth: Payer: Self-pay | Admitting: *Deleted

## 2013-01-26 NOTE — Telephone Encounter (Signed)
Message from pt's daughter, pt is at [facility] for rehab. The family has not been able to get records from her admission for dyspnea. Pt had fluid drawn off her lungs and they were told it showed cancer. Faxed request to [facility] for medical records to be sent to Dr. Truett Perna.

## 2013-02-01 ENCOUNTER — Telehealth: Payer: Self-pay | Admitting: Oncology

## 2013-02-01 ENCOUNTER — Ambulatory Visit (HOSPITAL_COMMUNITY): Payer: Medicare Other

## 2013-02-01 NOTE — Telephone Encounter (Signed)
Daughter called and r/s appt for pt to August 2014, nurse notified

## 2013-02-04 ENCOUNTER — Ambulatory Visit: Payer: Medicare Other | Admitting: Oncology

## 2013-02-05 ENCOUNTER — Telehealth: Payer: Self-pay | Admitting: *Deleted

## 2013-02-05 NOTE — Telephone Encounter (Signed)
Call from [place], nurse at [facility]. She stated pt's daughter informed them Dr. Truett Perna is requesting specific medical records. They do not have copy of CT done in [place]. Pt's daughter rescheduled office visit to 8/21.

## 2013-02-08 ENCOUNTER — Inpatient Hospital Stay (HOSPITAL_BASED_OUTPATIENT_CLINIC_OR_DEPARTMENT_OTHER)
Admission: EM | Admit: 2013-02-08 | Discharge: 2013-02-11 | DRG: 186 | Disposition: A | Discharge status: Skilled Nursing Facility | Payer: Medicare Other | Attending: Internal Medicine | Admitting: Internal Medicine

## 2013-02-08 ENCOUNTER — Encounter (HOSPITAL_BASED_OUTPATIENT_CLINIC_OR_DEPARTMENT_OTHER): Payer: Self-pay | Admitting: Emergency Medicine

## 2013-02-08 ENCOUNTER — Emergency Department (HOSPITAL_BASED_OUTPATIENT_CLINIC_OR_DEPARTMENT_OTHER): Payer: Medicare Other

## 2013-02-08 DIAGNOSIS — K5900 Constipation, unspecified: Secondary | ICD-10-CM

## 2013-02-08 DIAGNOSIS — J90 Pleural effusion, not elsewhere classified: Principal | ICD-10-CM | POA: Diagnosis present

## 2013-02-08 DIAGNOSIS — R5381 Other malaise: Secondary | ICD-10-CM | POA: Diagnosis present

## 2013-02-08 DIAGNOSIS — I509 Heart failure, unspecified: Secondary | ICD-10-CM | POA: Diagnosis present

## 2013-02-08 DIAGNOSIS — I2789 Other specified pulmonary heart diseases: Secondary | ICD-10-CM | POA: Diagnosis present

## 2013-02-08 DIAGNOSIS — F0390 Unspecified dementia without behavioral disturbance: Secondary | ICD-10-CM | POA: Diagnosis present

## 2013-02-08 DIAGNOSIS — J9610 Chronic respiratory failure, unspecified whether with hypoxia or hypercapnia: Secondary | ICD-10-CM | POA: Diagnosis present

## 2013-02-08 DIAGNOSIS — E039 Hypothyroidism, unspecified: Secondary | ICD-10-CM | POA: Diagnosis present

## 2013-02-08 DIAGNOSIS — I5031 Acute diastolic (congestive) heart failure: Secondary | ICD-10-CM | POA: Diagnosis present

## 2013-02-08 DIAGNOSIS — I10 Essential (primary) hypertension: Secondary | ICD-10-CM | POA: Diagnosis present

## 2013-02-08 DIAGNOSIS — I482 Chronic atrial fibrillation, unspecified: Secondary | ICD-10-CM | POA: Diagnosis present

## 2013-02-08 DIAGNOSIS — Z66 Do not resuscitate: Secondary | ICD-10-CM | POA: Diagnosis present

## 2013-02-08 DIAGNOSIS — Z79899 Other long term (current) drug therapy: Secondary | ICD-10-CM

## 2013-02-08 DIAGNOSIS — Z9010 Acquired absence of unspecified breast and nipple: Secondary | ICD-10-CM

## 2013-02-08 DIAGNOSIS — Z853 Personal history of malignant neoplasm of breast: Secondary | ICD-10-CM

## 2013-02-08 DIAGNOSIS — I4891 Unspecified atrial fibrillation: Secondary | ICD-10-CM | POA: Diagnosis present

## 2013-02-08 DIAGNOSIS — I5033 Acute on chronic diastolic (congestive) heart failure: Secondary | ICD-10-CM

## 2013-02-08 DIAGNOSIS — I079 Rheumatic tricuspid valve disease, unspecified: Secondary | ICD-10-CM | POA: Diagnosis present

## 2013-02-08 DIAGNOSIS — Z7901 Long term (current) use of anticoagulants: Secondary | ICD-10-CM | POA: Diagnosis present

## 2013-02-08 DIAGNOSIS — R0902 Hypoxemia: Secondary | ICD-10-CM | POA: Diagnosis present

## 2013-02-08 DIAGNOSIS — R911 Solitary pulmonary nodule: Secondary | ICD-10-CM | POA: Diagnosis present

## 2013-02-08 DIAGNOSIS — J9611 Chronic respiratory failure with hypoxia: Secondary | ICD-10-CM

## 2013-02-08 DIAGNOSIS — I5023 Acute on chronic systolic (congestive) heart failure: Secondary | ICD-10-CM | POA: Diagnosis present

## 2013-02-08 DIAGNOSIS — R0602 Shortness of breath: Secondary | ICD-10-CM | POA: Diagnosis present

## 2013-02-08 DIAGNOSIS — E785 Hyperlipidemia, unspecified: Secondary | ICD-10-CM | POA: Diagnosis present

## 2013-02-08 HISTORY — DX: Heart failure, unspecified: I50.9

## 2013-02-08 HISTORY — DX: Unspecified atrial fibrillation: I48.91

## 2013-02-08 HISTORY — DX: Pleural effusion, not elsewhere classified: J90

## 2013-02-08 HISTORY — DX: Hypothyroidism, unspecified: E03.9

## 2013-02-08 HISTORY — DX: Solitary pulmonary nodule: R91.1

## 2013-02-08 HISTORY — DX: Malignant neoplasm of unspecified site of unspecified female breast: C50.919

## 2013-02-08 HISTORY — DX: Essential (primary) hypertension: I10

## 2013-02-08 LAB — URINALYSIS, ROUTINE W REFLEX MICROSCOPIC
Bilirubin Urine: NEGATIVE
Nitrite: NEGATIVE
Specific Gravity, Urine: 1.018 (ref 1.005–1.030)
Urobilinogen, UA: 1 mg/dL (ref 0.0–1.0)

## 2013-02-08 LAB — CBC WITH DIFFERENTIAL/PLATELET
Basophils Relative: 0 % (ref 0–1)
Lymphocytes Relative: 17 % (ref 12–46)
Lymphs Abs: 1.5 10*3/uL (ref 0.7–4.0)
MCH: 28.8 pg (ref 26.0–34.0)
MCHC: 31 g/dL (ref 30.0–36.0)
MCV: 92.8 fL (ref 78.0–100.0)
Monocytes Relative: 12 % (ref 3–12)
Platelets: 303 10*3/uL (ref 150–400)
RDW: 15.8 % — ABNORMAL HIGH (ref 11.5–15.5)

## 2013-02-08 LAB — BASIC METABOLIC PANEL
BUN: 14 mg/dL (ref 6–23)
CO2: 29 mEq/L (ref 19–32)
Chloride: 102 mEq/L (ref 96–112)
GFR calc Af Amer: 65 mL/min — ABNORMAL LOW (ref >90)
Glucose, Bld: 132 mg/dL — ABNORMAL HIGH (ref 70–99)
Potassium: 4.5 mEq/L (ref 3.5–5.1)

## 2013-02-08 LAB — PROTIME-INR
INR: 2.45 — ABNORMAL HIGH (ref 0.00–1.49)
Prothrombin Time: 25.8 seconds — ABNORMAL HIGH (ref 11.6–15.2)

## 2013-02-08 LAB — URINE MICROSCOPIC-ADD ON

## 2013-02-08 MED ORDER — SODIUM CHLORIDE 0.9 % IJ SOLN
3.0000 mL | Freq: Two times a day (BID) | INTRAMUSCULAR | Status: DC
  Administered 2013-02-08: 3 mL via INTRAVENOUS

## 2013-02-08 MED ORDER — SODIUM CHLORIDE 0.9 % IJ SOLN: 3.0000 mL | INTRAMUSCULAR | Status: DC | PRN

## 2013-02-08 MED ORDER — CITALOPRAM HYDROBROMIDE 20 MG PO TABS
20.0000 mg | ORAL_TABLET | Freq: Every day | ORAL | Status: DC
  Administered 2013-02-09 – 2013-02-11 (×3): 20 mg via ORAL
  Filled 2013-02-08 (×3): qty 1

## 2013-02-08 MED ORDER — ATORVASTATIN CALCIUM 40 MG PO TABS
40.0000 mg | ORAL_TABLET | Freq: Every day | ORAL | Status: DC
  Administered 2013-02-08 – 2013-02-10 (×3): 40 mg via ORAL
  Filled 2013-02-08 (×5): qty 1

## 2013-02-08 MED ORDER — FUROSEMIDE 20 MG PO TABS
20.0000 mg | ORAL_TABLET | Freq: Every day | ORAL | Status: DC
  Administered 2013-02-09: 20 mg via ORAL
  Filled 2013-02-08: qty 1

## 2013-02-08 MED ORDER — SODIUM CHLORIDE 0.9 % IV SOLN: 250.0000 mL | INTRAVENOUS | Status: DC | PRN

## 2013-02-08 MED ORDER — LEVOTHYROXINE SODIUM 112 MCG PO TABS
112.0000 ug | ORAL_TABLET | Freq: Every day | ORAL | Status: DC
  Administered 2013-02-09 – 2013-02-11 (×3): 112 ug via ORAL
  Filled 2013-02-08 (×4): qty 1

## 2013-02-08 MED ORDER — DILTIAZEM HCL ER 180 MG PO CP24
180.0000 mg | ORAL_CAPSULE | Freq: Every day | ORAL | Status: DC
  Administered 2013-02-09 – 2013-02-11 (×3): 180 mg via ORAL
  Filled 2013-02-08 (×3): qty 1

## 2013-02-08 MED ORDER — ZOLPIDEM TARTRATE 5 MG PO TABS: 5.0000 mg | ORAL_TABLET | Freq: Every evening | ORAL | Status: DC | PRN

## 2013-02-08 MED ORDER — CYCLOBENZAPRINE HCL 10 MG PO TABS: 5.0000 mg | ORAL_TABLET | Freq: Three times a day (TID) | ORAL | Status: DC | PRN

## 2013-02-08 MED ORDER — SODIUM CHLORIDE 0.9 % IJ SOLN
3.0000 mL | Freq: Two times a day (BID) | INTRAMUSCULAR | Status: DC
  Administered 2013-02-09 – 2013-02-10 (×3): 3 mL via INTRAVENOUS

## 2013-02-08 MED ORDER — CARVEDILOL 3.125 MG PO TABS
3.1250 mg | ORAL_TABLET | Freq: Two times a day (BID) | ORAL | Status: DC
  Administered 2013-02-09 – 2013-02-11 (×5): 3.125 mg via ORAL
  Filled 2013-02-08 (×8): qty 1

## 2013-02-08 NOTE — ED Notes (Signed)
Attempt to call report to nurse for room # 2025-1 at [phone]. Staff sts in with another pt at this time, will return my call to get report.

## 2013-02-08 NOTE — ED Notes (Signed)
Daughter reports pt has been shob since Thursday night (4days ago). Pt had hip srugery in May, and while hospitalized then, had to have fluid "pulled off her lung", approx June. This is similar to that episode of shob. Pt now has crackels and mild wheezing.

## 2013-02-08 NOTE — ED Provider Notes (Signed)
History    CSN: [id number] Arrival date & time 02/08/13  1321  First MD Initiated Contact with Patient 02/08/13 1325     Chief Complaint  Patient presents with  . Shortness of Breath   (Consider location/radiation/quality/duration/timing/severity/associated sxs/prior Treatment) The history is provided by the patient and medical records.   Pt with PMH significant for HTN, CHF, Afib, hypothyroid, breast cancer, pleural effusion, presents to the ED for increased SOB x 4 days.  Pt is a resident at [facility] in [place].  Medical records and staff member present with her report that she broke her left hip while visiting family in [place] in May and had a left THA performed at the hospital there.  Since then has had recurrent CHF exacerbations and large pleural effusion in June which had to be drained.  Staff member states that her sx have been similar to prior episodes-- increased SOB, wheezing, etc.  Uses O2 at night PRN but has been having to use it during the day recently.  No recent cough, fevers, sweats, or chills.  Denies any chest pain or pressure.  No hx of DVT or PE.  Currently on coumadin for Afib.  Remote hx of breast cancer, currently in remission, and hx of small parenchymal lung densities monitored by serial CT scans, currently followed by oncology for both issues--Dr. Truett Perna.   PCP- Gildardo Cranker  Past Medical History  Diagnosis Date  . Pleural effusion   . Hypertension   . CHF (congestive heart failure)   . Atrial fibrillation   . Hypothyroid   . Breast cancer   . Lung nodule    Past Surgical History  Procedure Laterality Date  . Joint replacement    . Masectomy Left   . Fracture surgery     No family history on file. History  Substance Use Topics  . Smoking status: Not on file  . Smokeless tobacco: Not on file  . Alcohol Use: Not on file   OB History   No data available     Review of Systems  Respiratory: Positive for shortness of breath and wheezing.   All  other systems reviewed and are negative.    Allergies  Review of patient's allergies indicates no known allergies.  Home Medications   Current Outpatient Rx  Name  Route  Sig  Dispense  Refill  . atorvastatin (LIPITOR) 40 MG tablet   Oral   Take 40 mg by mouth daily.         . citalopram (CELEXA) 20 MG tablet               . diltiazem (DILACOR XR) 180 MG 24 hr capsule   Oral   Take 180 mg by mouth daily.         . furosemide (LASIX) 40 MG tablet   Oral   Take 40 mg by mouth daily.         Marland Kitchen levothyroxine (SYNTHROID, LEVOTHROID) 88 MCG tablet   Oral   Take 88 mcg by mouth daily.         Marland Kitchen warfarin (COUMADIN) 5 MG tablet   Oral   Take 5 mg by mouth daily.         Marland Kitchen zolpidem (AMBIEN) 10 MG tablet                BP 120/73  Pulse 86  Temp(Src) 98.3 F (36.8 C) (Oral)  Resp 26  SpO2 83%  Physical Exam  Nursing note and vitals  reviewed. Constitutional: She is oriented to person, place, and time. She appears well-developed and well-nourished. No distress. Nasal cannula in place.  HENT:  Head: Normocephalic and atraumatic.  Mouth/Throat: Oropharynx is clear and moist.  Eyes: Conjunctivae and EOM are normal. Pupils are equal, round, and reactive to light.  Neck: Normal range of motion.  Cardiovascular: Normal rate, regular rhythm and normal heart sounds.   Pulmonary/Chest: Effort normal. She has wheezes.  Diffuse wheezes, crackles at bases; on 3L O2 via [place]  Abdominal: Soft. Bowel sounds are normal. There is no tenderness. There is no guarding.  Musculoskeletal: Normal range of motion. She exhibits edema (2+ pitting edema bilaterally w/compression stockings in place).  Neurological: She is alert and oriented to person, place, and time. She has normal strength. No cranial nerve deficit or sensory deficit.  Skin: Skin is warm. She is not diaphoretic.  Psychiatric: She has a normal mood and affect.    ED Course  Procedures (including critical care  time)   Date: 02/08/2013  Rate: 81  Rhythm: atrial fibrillation  QRS Axis: normal  Intervals: indeterminate  ST/T Wave abnormalities: normal  Conduction Disutrbances:none  Narrative Interpretation:   Old EKG Reviewed: none available   Labs Reviewed  CBC WITH DIFFERENTIAL - Abnormal; Notable for the following:    Hemoglobin 11.2 (*)    RDW 15.8 (*)    All other components within normal limits  BASIC METABOLIC PANEL - Abnormal; Notable for the following:    Glucose, Bld 132 (*)    GFR calc non Af Amer 56 (*)    GFR calc Af Amer 65 (*)    All other components within normal limits  PRO B NATRIURETIC PEPTIDE - Abnormal; Notable for the following:    Pro B Natriuretic peptide (BNP) 2136.0 (*)    All other components within normal limits  PROTIME-INR - Abnormal; Notable for the following:    Prothrombin Time 25.8 (*)    INR 2.45 (*)    All other components within normal limits  TROPONIN I   Dg Chest 2 View  02/08/2013   *RADIOLOGY REPORT*  Clinical Data: Shortness of breath, wheezing, history pleural effusion, breast cancer, atrial fibrillation, hypertension, CHF  CHEST - 2 VIEW  Comparison: 07/01/2012 Correlation:  CT chest 07/28/2012  Findings: Post left mastectomy and axillary node dissection. Enlargement of cardiac silhouette with pulmonary vascular congestion. Moderate to large left pleural effusion with increased atelectasis versus consolidation in left lower lobe. Increased opacity at left lung apex corresponding to the left upper lobe scarring. Additionally, abnormal density is identified at the AP window and left hilum, corresponding to course of the elevated left pulmonary artery on the prior CT.  Underlying emphysematous changes. Streaky opacity at the right base appears to represent a combination of atelectasis and question nodularity as seen on the most recent CT. Right apical scarring stable. Bones diffusely demineralized. No pneumothorax. Atherosclerotic calcification aorta.   IMPRESSION: Moderate to large left pleural effusion with increased atelectasis versus consolidation in lower left lung. Underlying tumor in the lower left chest is not excluded; clinically indicated consider follow-up CT chest with contrast. Underlying COPD with biapical scarring greater on the left. Scattered chronic interstitial changes are identified with persistent opacities at the right base question representing combination of atelectasis and nodular foci which were seen on the prior CT chest.   Original Report Authenticated By: Ulyses Southward, M.D.   1. Pleural effusion on left   2. Shortness of breath   3. A-fib  4. Long term (current) use of anticoagulants     MDM   EKG Afib- pt has hx of this, currently on coumadin.  Trop negative.  Labs as above, elevated BNP at 2136.  CXR with moderate to large left pleural effusion-- also mention of LLL densities which are followed by oncology, Dr. Truett Perna.  O2 sats maintained at 99% on 3L via [place].  Consulted hospitalist, Dr. Benjamine Mola-- pt will be admitted to [facility] triad team 10, telemetry.  Temp admit orders placed.  VS stable for transfer.             Garlon Hatchet, PA-C 02/08/13 1649

## 2013-02-08 NOTE — Progress Notes (Signed)
Transfer from [facility].  From SNF with SOB x 4 days- wears home O2 at night PRN but has had to wear ATC for last few days.  Pleural effusion in past s/p thoracentesis.  In ER, placed on 3L and will need pulm consult and possible thoracentesis again.  NO coumadin for a fib Stable for tele bed per ER doctor  Marlin Canary

## 2013-02-08 NOTE — H&P (Signed)
PCP:   Daisy Floro, MD   Chief Complaint:  sob  HPI: 79 yo female h/o breast, lung nodules followed by dr Truett Perna for many years, afib on coumadin, chf comes to [facility] [place] [facility] with worsening sob for several days.  She currently resides in a rehab center.  She was overall healthy and was visiting her daughter in [place], [place] end of may when she fell and broke her hip.  She had that repaired and was placed in rehab faciltiy there.  She then required another hospitalization in [place] after surgery when she got very sob, she was found to have a large left pleural effusion which she had a thoracentesis.  Family was told that the fluid from that tap had malignent cells in it although the daughter present says it was "indeterminate".  She was transferred here to [place] about 2 weeks ago to a local rehab.  She has an appt with Dr. Truett Perna for f/u of this abnormal thoracentesis but not until August.  Pt denies any fevers, hemoptysis, wt loss.  No n/v/d.  No le edema or swelling.  She is chronically anticoagulated on coumadin.  No cp.  No abd pain.  When she was d/c from her hip surgery to rehab initially she was placed on oxygen at night only but previous to her hip fracture she did not require oxgyen supplementation.  Review of Systems:  Positive and negative as per HPI otherwise all other systems are negative  Past Medical History: Past Medical History  Diagnosis Date  . Pleural effusion   . Hypertension   . CHF (congestive heart failure)   . Atrial fibrillation   . Hypothyroid   . Breast cancer   . Lung nodule    Past Surgical History  Procedure Laterality Date  . Joint replacement    . Masectomy Left   . Fracture surgery      Medications: Prior to Admission medications   Medication Sig Start Date End Date Taking? Authorizing Provider  atorvastatin (LIPITOR) 40 MG tablet Take 40 mg by mouth daily.    Historical Provider, MD  citalopram (CELEXA) 20 MG  tablet  09/04/12   Historical Provider, MD  diltiazem (DILACOR XR) 180 MG 24 hr capsule Take 180 mg by mouth daily.    Historical Provider, MD  furosemide (LASIX) 40 MG tablet Take 40 mg by mouth daily.    Historical Provider, MD  levothyroxine (SYNTHROID, LEVOTHROID) 88 MCG tablet Take 88 mcg by mouth daily.    Historical Provider, MD  warfarin (COUMADIN) 5 MG tablet Take 5 mg by mouth daily.    Historical Provider, MD  zolpidem (AMBIEN) 10 MG tablet  07/14/12   Historical Provider, MD    Allergies:  No Known Allergies  Social History: Rehab, dnr nonsmoker  Family History: none  Physical Exam: Filed Vitals:   02/08/13 1326 02/08/13 1338 02/08/13 1757 02/08/13 1912  BP: 120/73 112/68 131/77 137/90  Pulse: 86 79 91 94  Temp: 98.3 F (36.8 C) 98.2 F (36.8 C)  98.4 F (36.9 C)  TempSrc: Oral Oral  Oral  Resp: 26  24 18   Height:  5\' 3"  (1.6 m)    Weight:  68.947 kg (152 lb)  72.2 kg (159 lb 2.8 oz)  SpO2: 83% 99% 97% 96%   General appearance: alert, cooperative and no distress Head: Normocephalic, without obvious abnormality, atraumatic  Resolving ecchymosis on face from previous fall Eyes: negative Nose: Nares normal. Septum midline. Mucosa normal.  No drainage or sinus tenderness. Neck: no JVD and supple, symmetrical, trachea midline Lungs: diminished breath sounds LLL and LUL Heart: regular rate and rhythm, S1, S2 normal, no murmur, click, rub or gallop Abdomen: soft, non-tender; bowel sounds normal; no masses,  no organomegaly Extremities: extremities normal, atraumatic, no cyanosis or edema Pulses: 2+ and symmetric Skin: Skin color, texture, turgor normal. No rashes or lesions Neurologic: Grossly normal    Labs on Admission:   Recent Labs  02/08/13 1400  NA 140  K 4.5  CL 102  CO2 29  GLUCOSE 132*  BUN 14  CREATININE 0.90  CALCIUM 9.3    Recent Labs  02/08/13 1400  WBC 8.4  NEUTROABS 5.5  HGB 11.2*  HCT 36.1  MCV 92.8  PLT 303    Recent Labs   02/08/13 1400  TROPONINI <0.30    Radiological Exams on Admission: Dg Chest 2 View  02/08/2013   *RADIOLOGY REPORT*  Clinical Data: Shortness of breath, wheezing, history pleural effusion, breast cancer, atrial fibrillation, hypertension, CHF  CHEST - 2 VIEW  Comparison: 07/01/2012 Correlation:  CT chest 07/28/2012  Findings: Post left mastectomy and axillary node dissection. Enlargement of cardiac silhouette with pulmonary vascular congestion. Moderate to large left pleural effusion with increased atelectasis versus consolidation in left lower lobe. Increased opacity at left lung apex corresponding to the left upper lobe scarring. Additionally, abnormal density is identified at the AP window and left hilum, corresponding to course of the elevated left pulmonary artery on the prior CT.  Underlying emphysematous changes. Streaky opacity at the right base appears to represent a combination of atelectasis and question nodularity as seen on the most recent CT. Right apical scarring stable. Bones diffusely demineralized. No pneumothorax. Atherosclerotic calcification aorta.  IMPRESSION: Moderate to large left pleural effusion with increased atelectasis versus consolidation in lower left lung. Underlying tumor in the lower left chest is not excluded; clinically indicated consider follow-up CT chest with contrast. Underlying COPD with biapical scarring greater on the left. Scattered chronic interstitial changes are identified with persistent opacities at the right base question representing combination of atelectasis and nodular foci which were seen on the prior CT chest.   Original Report Authenticated By: Ulyses Southward, M.D.    Assessment/Plan  79 yo female with sob from large left pleural effusion possible malignant with h/o breast cancer and lung nodule Principal Problem:   Pleural effusion on left Active Problems:   Atrial fibrillation   Long term (current) use of anticoagulants   Hypertension   Lung  nodule   SOB (shortness of breath)   Chronic respiratory failure with hypoxia  Ordered thoracentesis for in am with lab studies including cytology (not sure if these are entered correctly in epic).  Have called onc on call for consult in am dr Karel Jarvis.  Pt is DNR does not wish cpr or intubation ever in future.  Tele monitorning hold coumadin.  Mate Alegria A 02/08/2013, 7:24 PM

## 2013-02-09 ENCOUNTER — Encounter (HOSPITAL_COMMUNITY): Payer: Self-pay | Admitting: *Deleted

## 2013-02-09 ENCOUNTER — Inpatient Hospital Stay (HOSPITAL_COMMUNITY): Payer: Medicare Other

## 2013-02-09 DIAGNOSIS — I369 Nonrheumatic tricuspid valve disorder, unspecified: Secondary | ICD-10-CM

## 2013-02-09 LAB — BASIC METABOLIC PANEL
Calcium: 8.6 mg/dL (ref 8.4–10.5)
GFR calc Af Amer: 87 mL/min — ABNORMAL LOW (ref >90)
GFR calc non Af Amer: 75 mL/min — ABNORMAL LOW (ref >90)
Potassium: 3.8 mEq/L (ref 3.5–5.1)
Sodium: 140 mEq/L (ref 135–145)

## 2013-02-09 LAB — GRAM STAIN: Gram Stain: NONE SEEN

## 2013-02-09 LAB — BODY FLUID CELL COUNT WITH DIFFERENTIAL
Eos, Fluid: 0 %
Neutrophil Count, Fluid: 1 % (ref 0–25)
Other Cells, Fluid: 0 %

## 2013-02-09 LAB — CBC
MCH: 28.7 pg (ref 26.0–34.0)
MCHC: 31.9 g/dL (ref 30.0–36.0)
RDW: 15.9 % — ABNORMAL HIGH (ref 11.5–15.5)

## 2013-02-09 LAB — HEPATIC FUNCTION PANEL
ALT: 14 U/L (ref 0–35)
AST: 22 U/L (ref 0–37)
Albumin: 2.6 g/dL — ABNORMAL LOW (ref 3.5–5.2)
Alkaline Phosphatase: 118 U/L — ABNORMAL HIGH (ref 39–117)
Total Bilirubin: 0.8 mg/dL (ref 0.3–1.2)
Total Protein: 6.5 g/dL (ref 6.0–8.3)

## 2013-02-09 LAB — LACTATE DEHYDROGENASE, PLEURAL OR PERITONEAL FLUID

## 2013-02-09 LAB — PROTIME-INR
INR: 2.31 — ABNORMAL HIGH (ref 0.00–1.49)
Prothrombin Time: 24.6 seconds — ABNORMAL HIGH (ref 11.6–15.2)

## 2013-02-09 LAB — URINE CULTURE

## 2013-02-09 LAB — TROPONIN I
Troponin I: <0.3 ng/mL (ref <0.30)
Troponin I: <0.3 ng/mL (ref <0.30)

## 2013-02-09 MED ORDER — FUROSEMIDE 10 MG/ML IJ SOLN: 40.0000 mg | Freq: Every day | INTRAMUSCULAR | Status: DC

## 2013-02-09 MED ORDER — FUROSEMIDE 10 MG/ML IJ SOLN
40.0000 mg | Freq: Every day | INTRAMUSCULAR | Status: DC
  Administered 2013-02-10: 40 mg via INTRAVENOUS
  Filled 2013-02-09: qty 4

## 2013-02-09 MED ORDER — FUROSEMIDE 10 MG/ML IJ SOLN
40.0000 mg | Freq: Two times a day (BID) | INTRAMUSCULAR | Status: DC
  Administered 2013-02-09: 40 mg via INTRAVENOUS
  Filled 2013-02-09: qty 4

## 2013-02-09 NOTE — ED Provider Notes (Signed)
Medical screening examination/treatment/procedure(s) were conducted as a shared visit with non-physician practitioner(s) and myself.  I personally evaluated the patient during the encounter.  Patient with history of HTN, CHF, Afib with recent pleural effusion requiring thoracentesis.  She presents today with increasing shortness of breath for the past several days.  She denies fevers, chills, or cough.    On exam, the patient is afebrile and the vitals are stable.  The pulse ox is 83% on room air.  The heart is regular rate and rhythm and the lungs are noted to have diminished sounds in the left base.  The abdomen is soft and non-tender.  There is no edema.    The workup reveals what appears to be a recurrence of the left pleural effusion.  The labs and cardiac workup are otherwise unremarkable.  She remains hypoxic and I feel as though she is in need of admission.  Arrangements made for transfer to [facility].  Geoffery Lyons, MD 02/09/13 1125

## 2013-02-09 NOTE — Progress Notes (Signed)
Called ultrasound to let PA know pt's INR 2.31. Will proceed with thoracentesis. Consent signed and in chart. Daughter, Leah Guerra, aware. Dion Saucier

## 2013-02-09 NOTE — Progress Notes (Addendum)
Clinical Social Work Department BRIEF PSYCHOSOCIAL ASSESSMENT 02/09/2013  Patient:  Leah Guerra, Leah Guerra     Account Number:  [id number]     Admit date:  02/08/2013  Clinical Social Worker:  Carren Rang  Date/Time:  02/09/2013 01:58 PM  Referred by:  RN  Date Referred:  02/09/2013 Referred for  SNF Placement   Other Referral:   Interview type:  Patient Other interview type:    PSYCHOSOCIAL DATA Living Status:  FACILITY Admitted from facility:  [facility] Level of care:  Assisted Living Primary support name:  Harriett Sine Primary support relationship to patient:  CHILD, ADULT Degree of support available:   Good    CURRENT CONCERNS Current Concerns  Post-Acute Placement   Other Concerns:    SOCIAL WORK ASSESSMENT / PLAN Clinical Social Worker received referral for SNF placement at d/c. CSW introduced self and explained reason for visit. CSW explained SNF process. Patient reported she is from [place] and is wanting to return after d/c. CSW called [facility] and informed that patient is in hospital and may need a higher level of care after d/c. CSW will complete FL2 for MD's signature and will update family and daughter, Harriett Sine once PT meets with patient. CSW will call facility once patient meets with PT and determines the level of care needed.   Assessment/plan status:  Psychosocial Support/Ongoing Assessment of Needs Other assessment/ plan:   Information/referral to community resources:   SNF options,    PATIENT'S/FAMILY'S RESPONSE TO PLAN OF CARE: Patient was alert and oriented during visit. She stated that she was from [place] and would like to return after d/c. Patient stated that her family lives nearby [place] and has good family support. Patient wanted CSW to call daughter, Drema Pry @ [phone], to update her. CSW left message with Harriett Sine.       Maree Krabbe, MSW, Theresia Majors [phone]

## 2013-02-09 NOTE — Progress Notes (Signed)
[facility] PROGRESS NOTE  Leah Guerra [facility] WUJ:[id number] DOB: Oct 18, 1924 DOA: 02/08/2013 PCP: Daisy Floro, MD  Assessment/Plan: Left pleural effusion -Concerned about malignant effusion -Cannot rule out CHF given the patient's Elevated proBNP and history of atrial fibrillation -Thoracocentesis has been ordered for today -Start intravenous furosemide 40 mg BID -Echocardiogram -I have ordered a cell count, LDH, protein, Gram stain and culture on the pleural fluid -LDH and protein ordered in serum -Cytology of the pleural fluid -According to the patient's daughter, patient had thoracocentesis in [place] with "indeterminant" result about one month ago -Spoke with Dr. Truett Perna whom had records from [place]-->they felt more CHF was etiology  (Dr. Truett Perna will see pt) Atrial fibrillation -Rate controlled -Continue Coumadin -continue diltiazem CD Hypertension -Controlled -Continue diltiazem -Continue warfarin Hypothyroidism -Continue home dose Synthroid -Unclear of elevated TSH represents a sick euthyroid syndrome--check free T4 History of breast cancer -Status post left mastectomy and Femara -Follows Dr. Sheran Spine have consulted him -previously noted to be in remission Chronic respiratory failure with hypoxia -Patient was previously maintained on 2 L nasal cannula   Family Communication:   Pt at beside Disposition Plan:   Home when medically stable      Procedures/Studies: Dg Chest 2 View  02/08/2013   *RADIOLOGY REPORT*  Clinical Data: Shortness of breath, wheezing, history pleural effusion, breast cancer, atrial fibrillation, hypertension, CHF  CHEST - 2 VIEW  Comparison: 07/01/2012 Correlation:  CT chest 07/28/2012  Findings: Post left mastectomy and axillary node dissection. Enlargement of cardiac silhouette with pulmonary vascular congestion. Moderate to large left pleural effusion with increased atelectasis versus consolidation in left lower lobe. Increased  opacity at left lung apex corresponding to the left upper lobe scarring. Additionally, abnormal density is identified at the AP window and left hilum, corresponding to course of the elevated left pulmonary artery on the prior CT.  Underlying emphysematous changes. Streaky opacity at the right base appears to represent a combination of atelectasis and question nodularity as seen on the most recent CT. Right apical scarring stable. Bones diffusely demineralized. No pneumothorax. Atherosclerotic calcification aorta.  IMPRESSION: Moderate to large left pleural effusion with increased atelectasis versus consolidation in lower left lung. Underlying tumor in the lower left chest is not excluded; clinically indicated consider follow-up CT chest with contrast. Underlying COPD with biapical scarring greater on the left. Scattered chronic interstitial changes are identified with persistent opacities at the right base question representing combination of atelectasis and nodular foci which were seen on the prior CT chest.   Original Report Authenticated By: Ulyses Southward, M.D.         Subjective: Patient complains of shortness of breath. Denies any fevers, chills, chest discomfort, nausea, vomiting, diarrhea, dizziness, abdominal pain, dysuria.  Objective: Filed Vitals:   02/08/13 1757 02/08/13 1912 02/08/13 2030 02/09/13 0455  BP: 131/77 137/90 124/66 130/87  Pulse: 91 94 68 69  Temp:  98.4 F (36.9 C)  97.8 F (36.6 C)  TempSrc:  Oral  Oral  Resp: 24 18 18 18   Height:      Weight:  72.2 kg (159 lb 2.8 oz)    SpO2: 97% 96%  99%    Intake/Output Summary (Last 24 hours) at 02/09/13 1234 Last data filed at 02/09/13 0700  Gross per 24 hour  Intake      0 ml  Output    150 ml  Net   -150 ml   Weight change:  Exam:   General:  Pt is alert, follows commands appropriately, not  in acute distress  HEENT: No icterus, No thrush,  [place]/AT  Cardiovascular: IRRR, S1/S2, no rubs, no gallops  Respiratory:  Bibasilar crackles. Diminished breath sounds at bases, left greater than right  Abdomen: Soft/+BS, non tender, non distended, no guarding  Extremities: trace edema, No lymphangitis, No petechiae, No rashes, no synovitis  Data Reviewed: Basic Metabolic Panel:  Recent Labs Lab 02/08/13 1400 02/09/13 0330  NA 140 140  K 4.5 3.8  CL 102 104  CO2 29 27  GLUCOSE 132* 108*  BUN 14 13  CREATININE 0.90 0.73  CALCIUM 9.3 8.6   Liver Function Tests:  Recent Labs Lab 02/08/13 2054  PROT 6.6   No results found for this basename: LIPASE, AMYLASE,  in the last 168 hours No results found for this basename: AMMONIA,  in the last 168 hours CBC:  Recent Labs Lab 02/08/13 1400 02/09/13 0330  WBC 8.4 6.3  NEUTROABS 5.5  --   HGB 11.2* 10.2*  HCT 36.1 32.0*  MCV 92.8 89.9  PLT 303 271   Cardiac Enzymes:  Recent Labs Lab 02/08/13 1400 02/08/13 2054 02/09/13 0330 02/09/13 0825  TROPONINI <0.30 <0.30 <0.30 <0.30   BNP: No components found with this basename: POCBNP,  CBG: No results found for this basename: GLUCAP,  in the last 168 hours  No results found for this or any previous visit (from the past 240 hour(s)).   Scheduled Meds: . atorvastatin  40 mg Oral q1800  . carvedilol  3.125 mg Oral BID WC  . citalopram  20 mg Oral Daily  . diltiazem  180 mg Oral Daily  . furosemide  40 mg Intravenous Daily  . levothyroxine  112 mcg Oral QAC breakfast  . sodium chloride  3 mL Intravenous Q12H   Continuous Infusions:    Devera Englander, DO  [facility] Pager [phone]  If 7PM-7AM, please contact night-coverage [email or web address] Password [facility] 02/09/2013, 12:34 PM   LOS: 1 day

## 2013-02-09 NOTE — Progress Notes (Signed)
Utilization Review Completed.Leah Guerra T7/15/2014

## 2013-02-09 NOTE — Care Management Note (Signed)
    Page 1 of 1   02/11/2013     4:31:45 PM   CARE MANAGEMENT NOTE 02/11/2013  Patient:  Leah Guerra, Leah Guerra   Account Number:  [id number]  Date Initiated:  02/09/2013  Documentation initiated by:  Kyrstyn Greear  Subjective/Objective Assessment:   PT ADM ON 02/08/13 WITH AFIB, PLEURAL EFFUSION.  PTA, PT RESIDED AT [facility]ILL[facility]UR[facility] FACILITY.     Action/Plan:   CSW CONSULTED TO FACILITATE RETURN TO SNF WHEN MEDICALLY STABLE FOR DC.   Anticipated DC Date:  02/12/2013   Anticipated DC Plan:  SKILLED NURSING FACILITY  In-house referral  Clinical Social Worker      DC Planning Services  CM consult      Choice offered to / List presented to:             Status of service:  Completed, signed off Medicare Important Message given?   (If response is "NO", the following Medicare IM given date fields will be blank) Date Medicare IM given:   Date Additional Medicare IM given:    Discharge Disposition:  SKILLED NURSING FACILITY  Per UR Regulation:  Reviewed for med. necessity/level of care/duration of stay  If discussed at Long Length of Stay Meetings, dates discussed:    Comments:  02/11/13 Rohan Juenger,RN,BSN [phone] PT DISCHARGED TO SNF TODAY, PER CSW ARRANGEMENTS.  02/10/13 Nonie Lochner,RN,BSN [phone] P.T. EVAL PENDING IN ANTICIPATION OF RETURN TO [facility] SN[facility].

## 2013-02-09 NOTE — Procedures (Signed)
B pleural effusions Left larger than Right  US guided L thora 1 liter yellow fluid Procedure stopped - pt coughing  Fluid sent for labs per MD cxr pending

## 2013-02-10 ENCOUNTER — Telehealth: Payer: Self-pay | Admitting: *Deleted

## 2013-02-10 ENCOUNTER — Encounter (HOSPITAL_COMMUNITY): Payer: Self-pay | Admitting: Emergency Medicine

## 2013-02-10 DIAGNOSIS — I5033 Acute on chronic diastolic (congestive) heart failure: Secondary | ICD-10-CM

## 2013-02-10 LAB — CBC
HCT: 36.4 % (ref 36.0–46.0)
MCH: 29.3 pg (ref 26.0–34.0)
MCHC: 33 g/dL (ref 30.0–36.0)
MCV: 89 fL (ref 78.0–100.0)
Platelets: 298 10*3/uL (ref 150–400)
RDW: 15.7 % — ABNORMAL HIGH (ref 11.5–15.5)
WBC: 7.4 10*3/uL (ref 4.0–10.5)

## 2013-02-10 LAB — BASIC METABOLIC PANEL
BUN: 13 mg/dL (ref 6–23)
CO2: 30 mEq/L (ref 19–32)
Calcium: 8.7 mg/dL (ref 8.4–10.5)
Chloride: 101 mEq/L (ref 96–112)
Creatinine, Ser: 0.74 mg/dL (ref 0.50–1.10)
Glucose, Bld: 94 mg/dL (ref 70–99)

## 2013-02-10 LAB — T4, FREE: Free T4: 1.36 ng/dL (ref 0.80–1.80)

## 2013-02-10 MED ORDER — FUROSEMIDE 40 MG PO TABS
40.0000 mg | ORAL_TABLET | Freq: Two times a day (BID) | ORAL | Status: DC
  Administered 2013-02-10 – 2013-02-11 (×2): 40 mg via ORAL
  Filled 2013-02-10 (×4): qty 1

## 2013-02-10 MED ORDER — WARFARIN - PHARMACIST DOSING INPATIENT: Freq: Every day | Status: DC

## 2013-02-10 MED ORDER — POLYETHYLENE GLYCOL 3350 17 G PO PACK
17.0000 g | PACK | Freq: Every day | ORAL | Status: DC | PRN
  Filled 2013-02-10: qty 1

## 2013-02-10 MED ORDER — WARFARIN SODIUM 5 MG PO TABS
5.0000 mg | ORAL_TABLET | Freq: Once | ORAL | Status: AC
  Administered 2013-02-10: 5 mg via ORAL
  Filled 2013-02-10: qty 1

## 2013-02-10 MED ORDER — POTASSIUM CHLORIDE CRYS ER 20 MEQ PO TBCR
20.0000 meq | EXTENDED_RELEASE_TABLET | Freq: Every day | ORAL | Status: DC
  Administered 2013-02-10 – 2013-02-11 (×2): 20 meq via ORAL
  Filled 2013-02-10 (×2): qty 1

## 2013-02-10 NOTE — Telephone Encounter (Signed)
Call from pt's daughter asking for MD to review pt's records from [facility]. Wonders if she should be seen in office sooner than her 8/8 appt. Harriett Sine is concerned that the pleural effusion returned after only about 3 weeks. Wants to be sure this is being followed. Wants to know who to contact if this turns out not to be cancer related.

## 2013-02-10 NOTE — Progress Notes (Signed)
IP PROGRESS NOTE  Subjective:   She is well-known to me with a remote history of breast cancer. She was admitted with dyspnea on 02/08/2013. Ms. Knutson had a fall and fractured her left hip while visiting her daughter in [place]. She was admitted with dyspnea following surgery and underwent a left thoracentesis for treatment of a large left pleural effusion. The cytology from the thoracentesis was negative. This was in June.  She reports feeling well prior to leaving for [place]. She denies anorexia and pain. She reports dyspnea for several months.  Objective: Vital signs in last 24 hours: Blood pressure 115/68, pulse 86, temperature 97.4 F (36.3 C), temperature source Oral, resp. rate 18, height 5\' 3"  (1.6 m), weight 144 lb 6.4 oz (65.5 kg), SpO2 96.00%.  Intake/Output from previous day: 07/15 0701 - 07/16 0700 In: 240 [P.O.:240] Out: 1900 [Urine:900]  Physical Exam:  HEENT: Neck without mass Lungs: Decreased breath sounds over the left chest. Inspiratory rub at the left upper anterior chest. Cardiac: Irregular Abdomen: No hepatomegaly Extremities: Trace low leg edema bilaterally Breasts: Status post left mastectomy. No evidence for chest wall tumor recurrence. Lymph nodes: No cervical, supraclavicular, or left axillary nodes. 1/2-1 cm mobile right axillary node  Lab Results:  Recent Labs  02/09/13 0330 02/10/13 0545  WBC 6.3 7.4  HGB 10.2* 12.0  HCT 32.0* 36.4  PLT 271 298    BMET  Recent Labs  02/09/13 0330 02/10/13 0545  NA 140 140  K 3.8 3.8  CL 104 101  CO2 27 30  GLUCOSE 108* 94  BUN 13 13  CREATININE 0.73 0.74  CALCIUM 8.6 8.7    Studies/Results: Dg Chest 1 View  02/09/2013   *RADIOLOGY REPORT*  Clinical Data: Left pleural effusion, status post thoracentesis.  CHEST - 1 VIEW  Comparison: 02/08/2013  Findings: Significant reduction in size of left pleural fluid collection.  Residual density at the left lung apex and left lung base.  Underlying  cardiomegaly noted.  Compared to yesterday, there is increased interstitial and patchy airspace opacity favoring edema.  Nodularity in the right mid lung persists.  IMPRESSION: 1.  Reduced size left pleural fluid collection, without pneumothorax. 2.  Compared to yesterday, there is increased interstitial and patchy airspace edema. 3.  Nodularity in the right midlung- malignancy is not excluded. 4.  Mild cardiomegaly.   Original Report Authenticated By: Gaylyn Rong, M.D.   Dg Chest 2 View  02/08/2013   *RADIOLOGY REPORT*  Clinical Data: Shortness of breath, wheezing, history pleural effusion, breast cancer, atrial fibrillation, hypertension, CHF  CHEST - 2 VIEW  Comparison: 07/01/2012 Correlation:  CT chest 07/28/2012  Findings: Post left mastectomy and axillary node dissection. Enlargement of cardiac silhouette with pulmonary vascular congestion. Moderate to large left pleural effusion with increased atelectasis versus consolidation in left lower lobe. Increased opacity at left lung apex corresponding to the left upper lobe scarring. Additionally, abnormal density is identified at the AP window and left hilum, corresponding to course of the elevated left pulmonary artery on the prior CT.  Underlying emphysematous changes. Streaky opacity at the right base appears to represent a combination of atelectasis and question nodularity as seen on the most recent CT. Right apical scarring stable. Bones diffusely demineralized. No pneumothorax. Atherosclerotic calcification aorta.  IMPRESSION: Moderate to large left pleural effusion with increased atelectasis versus consolidation in lower left lung. Underlying tumor in the lower left chest is not excluded; clinically indicated consider follow-up CT chest with contrast. Underlying COPD with biapical  scarring greater on the left. Scattered chronic interstitial changes are identified with persistent opacities at the right base question representing combination of  atelectasis and nodular foci which were seen on the prior CT chest.   Original Report Authenticated By: Ulyses Southward, M.D.   US Thoracentesis Asp Pleural Space W/img Guide  02/09/2013   *RADIOLOGY REPORT*  Clinical Data:  Bilateral pleural effusions; left larger than right  ULTRASOUND GUIDED left THORACENTESIS  Comparison:  None  An ultrasound guided thoracentesis was thoroughly discussed with the patient and questions answered.  The benefits, risks, alternatives and complications were also discussed.  The patient understands and wishes to proceed with the procedure.  Written consent was obtained.  Ultrasound was performed to localize and mark an adequate pocket of fluid in the left chest.  The area was then prepped and draped in the normal sterile fashion.  1% Lidocaine was used for local anesthesia.  Under ultrasound guidance a 19 gauge Yueh catheter was introduced.  Thoracentesis was performed.  The catheter was removed and a dressing applied.  Complications:  None  Findings: A total of approximately 1 liter of yellow fluid was removed. A fluid sample was sent for laboratory analysis.  IMPRESSION: Successful ultrasound guided left thoracentesis yielding 1 liter of pleural fluid.  Pt started coughing; needed to stop procedure.  Read by: Ralene Muskrat, P.A.-C   Original Report Authenticated By: Tacey Ruiz, MD    Medications: I have reviewed the patient's current medications.  Assessment/Plan: 1. Stage III-A left-sided breast cancer diagnosed in February of 2000. She was maintained on Femara from February 2005 through November 2008.       2. History of chronic parenchymal lung densities and a borderline elevated CA27.29. Multiple CT scans have been negative for evidence of disease progression. She last underwent a CT of the chest in December of 2013.      3.  left pleural effusion-pleural fluid cytology negative while in [place] in June 2014, status post a diagnostic/therapeutic thoracentesis  02/09/2013      4.  Atrial fibrillation       5.  Status post a fall with a left hip fracture June 2014  Ms. Mcginley has a remote history of breast cancer. She has chronic parenchymal lung densities. She now presents with asymptomatic left pleural effusion. The pleural effusion could be related to recurrent breast cancer or another malignancy. However the pleural fluid cytology was negative in [place]. The effusion may be related to congestive heart failure.  I will followup on the pleural fluid cytology from 02/09/2013 and make additional recommendations as indicated. We will consider a restaging CT of the chest is pending the pleural fluid cytology.     LOS: 2 days   Aedin Jeansonne, Jillyn Hidden  02/10/2013, 9:41 AM

## 2013-02-10 NOTE — Progress Notes (Signed)
[facility]                                                                                Patient Demographics  Leah Guerra, is a 79 y.o. female, DOB - [birth year], GNF:[id number], QIO:[id number]  Admit date - 02/08/2013  Admitting Physician Joseph Art, DO  Outpatient Primary MD for the patient is Daisy Floro, MD  LOS - 2   Chief Complaint  Patient presents with  . Shortness of Breath        Assessment & Plan     Left pleural effusion  -Concerned about malignant effusion  -Cannot rule out CHF given the patient's Elevated proBNP and history of atrial fibrillation  -Thoracocentesis was done and fluid remains inconclusive between exudate and transudate -She does have chronic systolic CHF an echo consistent with it, this could be transudate, we'll request pulmonary to evaluate. We'll continue to diurese her.     Acute on chronic systolic CHF. EF 45%  Will transition to oral diuretics, appears to be close to compensated, since echo now shows severe hypokinesis and almost in a medically we'll request cardiology to see the patient, likely candidate for medical treatment only. Continue Coreg, is on Coumadin. Question need of aspirin.   Echo  - Left ventricle: The cavity size was at the upper limits of normal. Wall thickness was normal. Systolic function was mildly reduced. The estimated ejection fraction was 50%, in the range of 45% to 50%. Diffuse hypokinesis. - Regional wall motion abnormality: Moderate hypokinesis of the mid anteroseptal myocardium; mild hypokinesis of the basal anteroseptal myocardium. - Aortic valve: Mild focal calcification, nodularity, and sclerosis without stenosis involving the right coronary, left coronary, and noncoronary cusp. - Mitral valve: Mildly dilated, mildly calcified annulus. Mild focal thickening, elongation, and tethering of the anterior leaflet, consistent with rheumatic disease. Trivial, late systolicsystolic bowing  without prolapse, involving the anterior leaflet. Mild regurgitation directed posteriorly and toward the free wall. - Left atrium: The atrium was severely dilated. - Right ventricle: The cavity size was mildly to moderately dilated. Wall thickness was normal. - Right atrium: The atrium was massively dilated. - Tricuspid valve: Mild-moderate regurgitation. - Pulmonary arteries: Systolic pressure was moderately increased. PA peak pressure: 49mm Hg (S). - Pericardium, extracardiac: A trivial pericardial effusion was identified posterior to the heart. There was a left pleural effusion.    Atrial fibrillation  -Rate controlled  -Continue Coumadin  -continue diltiazem CD     Hypertension  -Controlled  -Continue diltiazem  -Continue warfarin    Hypothyroidism  -Continue home dose Synthroid  -TSH mildly elevated here could be sick euthyroid syndrome, repeat TSH in 4-6 weeks once she is stable.      History of breast cancer  -Status post left mastectomy and Femara  -Follows Dr. Thedore Mins has been consulted for inpatient followup -previously noted to be in remission     Chronic respiratory failure with hypoxia  -Patient was previously maintained on 2 L nasal cannula     Code Status: DO NOT RESUSCITATE  Family Communication: None present  Disposition Plan: Home/SNF   Procedures  thoracentesis by IR   Consults Cards, PCCM, Oncology   DVT Prophylaxis  Coumadin - SCDs   Lab Results  Component Value Date   PLT 298 02/10/2013    Medications  Scheduled Meds: . atorvastatin  40 mg Oral q1800  . carvedilol  3.125 mg Oral BID WC  . citalopram  20 mg Oral Daily  . diltiazem  180 mg Oral Daily  . furosemide  40 mg Intravenous Daily  . levothyroxine  112 mcg Oral QAC breakfast  . sodium chloride  3 mL Intravenous Q12H  . warfarin  5 mg Oral ONCE-1800  . Warfarin - Pharmacist Dosing Inpatient   Does not apply q1800   Continuous Infusions:  PRN Meds:.sodium  chloride, cyclobenzaprine, polyethylene glycol, zolpidem  Antibiotics    Anti-infectives   None       Time Spent in minutes   35   Susa Raring K M.D on 02/10/2013 at 11:51 AM  Between 7am to 7pm - Pager - [phone]  After 7pm go to [email or web address] - password [email or web address]  And look for the night coverage person covering for me after hours  [facility] Office  [phone]    Subjective:   Leah Guerra today has, No headache, No chest pain, No abdominal pain - No Nausea, No new weakness tingling or numbness, No Cough - SOB.    Objective:   Filed Vitals:   02/09/13 2231 02/10/13 0351 02/10/13 0805 02/10/13 0949  BP: 119/63 116/77 115/68 117/70  Pulse: 77 77 86 92  Temp: 99 F (37.2 C) 97.4 F (36.3 C)    TempSrc: Oral Oral    Resp: 18 18    Height:      Weight:  65.5 kg (144 lb 6.4 oz)    SpO2: 97% 96%      Wt Readings from Last 3 Encounters:  02/10/13 65.5 kg (144 lb 6.4 oz)  09/22/12 69.219 kg (152 lb 9.6 oz)  07/17/12 66.679 kg (147 lb)     Intake/Output Summary (Last 24 hours) at 02/10/13 1151 Last data filed at 02/09/13 2100  Gross per 24 hour  Intake    240 ml  Output   1900 ml  Net  -1660 ml    Exam Awake Alert, Oriented X2, No new F.N deficits, Normal affect [place].AT,PERRAL Supple Neck,No JVD, No cervical lymphadenopathy appriciated.  Symmetrical Chest wall movement, Good air movement bilaterally, CTAB RRR,No Gallops,Rubs or new Murmurs, No Parasternal Heave +ve B.Sounds, Abd Soft, Non tender, No organomegaly appriciated, No rebound - guarding or rigidity. No Cyanosis, Clubbing or edema, No new Rash or bruise     Data Review   Micro Results Recent Results (from the past 240 hour(s))  URINE CULTURE     Status: None   Collection Time    02/08/13  5:58 PM      Result Value Range Status   Specimen Description URINE, CLEAN CATCH   Final   Special Requests NONE   Final   Culture  Setup Time 02/09/2013 00:42   Final   Colony  Count 70,000 COLONIES/ML   Final   Culture     Final   Value: Multiple bacterial morphotypes present, none predominant. Suggest appropriate recollection if clinically indicated.   Report Status 02/09/2013 FINAL   Final  GRAM STAIN     Status: None   Collection Time    02/09/13  2:29 PM      Result Value Range Status   Specimen Description FLUID PLEURAL LEFT   Final   Special Requests FLUID   Final   Gram Stain  Final   Value: NO WBC SEEN     NO ORGANISMS SEEN   Report Status 02/09/2013 FINAL   Final  BODY FLUID CULTURE     Status: None   Collection Time    02/09/13  2:29 PM      Result Value Range Status   Specimen Description FLUID PLEURAL LEFT   Final   Special Requests FLUID   Final   Gram Stain     Final   Value: NO WBC SEEN     NO ORGANISMS SEEN     Performed at [facility]   Culture PENDING   Incomplete   Report Status PENDING   Incomplete    Radiology Reports Dg Chest 1 View  02/09/2013   *RADIOLOGY REPORT*  Clinical Data: Left pleural effusion, status post thoracentesis.  CHEST - 1 VIEW  Comparison: 02/08/2013  Findings: Significant reduction in size of left pleural fluid collection.  Residual density at the left lung apex and left lung base.  Underlying cardiomegaly noted.  Compared to yesterday, there is increased interstitial and patchy airspace opacity favoring edema.  Nodularity in the right mid lung persists.  IMPRESSION: 1.  Reduced size left pleural fluid collection, without pneumothorax. 2.  Compared to yesterday, there is increased interstitial and patchy airspace edema. 3.  Nodularity in the right midlung- malignancy is not excluded. 4.  Mild cardiomegaly.   Original Report Authenticated By: Gaylyn Rong, M.D.   Dg Chest 2 View  02/08/2013   *RADIOLOGY REPORT*  Clinical Data: Shortness of breath, wheezing, history pleural effusion, breast cancer, atrial fibrillation, hypertension, CHF  CHEST - 2 VIEW  Comparison: 07/01/2012 Correlation:   CT chest 07/28/2012  Findings: Post left mastectomy and axillary node dissection. Enlargement of cardiac silhouette with pulmonary vascular congestion. Moderate to large left pleural effusion with increased atelectasis versus consolidation in left lower lobe. Increased opacity at left lung apex corresponding to the left upper lobe scarring. Additionally, abnormal density is identified at the AP window and left hilum, corresponding to course of the elevated left pulmonary artery on the prior CT.  Underlying emphysematous changes. Streaky opacity at the right base appears to represent a combination of atelectasis and question nodularity as seen on the most recent CT. Right apical scarring stable. Bones diffusely demineralized. No pneumothorax. Atherosclerotic calcification aorta.  IMPRESSION: Moderate to large left pleural effusion with increased atelectasis versus consolidation in lower left lung. Underlying tumor in the lower left chest is not excluded; clinically indicated consider follow-up CT chest with contrast. Underlying COPD with biapical scarring greater on the left. Scattered chronic interstitial changes are identified with persistent opacities at the right base question representing combination of atelectasis and nodular foci which were seen on the prior CT chest.   Original Report Authenticated By: Ulyses Southward, M.D.   US Thoracentesis Asp Pleural Space W/img Guide  02/09/2013   *RADIOLOGY REPORT*  Clinical Data:  Bilateral pleural effusions; left larger than right  ULTRASOUND GUIDED left THORACENTESIS  Comparison:  None  An ultrasound guided thoracentesis was thoroughly discussed with the patient and questions answered.  The benefits, risks, alternatives and complications were also discussed.  The patient understands and wishes to proceed with the procedure.  Written consent was obtained.  Ultrasound was performed to localize and mark an adequate pocket of fluid in the left chest.  The area was then  prepped and draped in the normal sterile fashion.  1% Lidocaine was used for local anesthesia.  Under ultrasound guidance a  19 gauge Yueh catheter was introduced.  Thoracentesis was performed.  The catheter was removed and a dressing applied.  Complications:  None  Findings: A total of approximately 1 liter of yellow fluid was removed. A fluid sample was sent for laboratory analysis.  IMPRESSION: Successful ultrasound guided left thoracentesis yielding 1 liter of pleural fluid.  Pt started coughing; needed to stop procedure.  Read by: Ralene Muskrat, P.A.-C   Original Report Authenticated By: Tacey Ruiz, MD    CBC  Recent Labs Lab 02/08/13 1400 02/09/13 0330 02/10/13 0545  WBC 8.4 6.3 7.4  HGB 11.2* 10.2* 12.0  HCT 36.1 32.0* 36.4  PLT 303 271 298  MCV 92.8 89.9 89.0  MCH 28.8 28.7 29.3  MCHC 31.0 31.9 33.0  RDW 15.8* 15.9* 15.7*  LYMPHSABS 1.5  --   --   MONOABS 1.0  --   --   EOSABS 0.4  --   --   BASOSABS 0.0  --   --     Chemistries   Recent Labs Lab 02/08/13 1400 02/09/13 0330 02/09/13 1230 02/10/13 0545  NA 140 140  --  140  K 4.5 3.8  --  3.8  CL 102 104  --  101  CO2 29 27  --  30  GLUCOSE 132* 108*  --  94  BUN 14 13  --  13  CREATININE 0.90 0.73  --  0.74  CALCIUM 9.3 8.6  --  8.7  AST  --   --  22  --   ALT  --   --  14  --   ALKPHOS  --   --  118*  --   BILITOT  --   --  0.8  --    ------------------------------------------------------------------------------------------------------------------ estimated creatinine clearance is 45.9 ml/min (by C-G formula based on Cr of 0.74). ------------------------------------------------------------------------------------------------------------------ No results found for this basename: HGBA1C,  in the last 72 hours ------------------------------------------------------------------------------------------------------------------ No results found for this basename: CHOL, HDL, LDLCALC, TRIG, CHOLHDL, LDLDIRECT,  in  the last 72 hours ------------------------------------------------------------------------------------------------------------------  Recent Labs  02/08/13 2054  TSH 6.479*   ------------------------------------------------------------------------------------------------------------------ No results found for this basename: VITAMINB12, FOLATE, FERRITIN, TIBC, IRON, RETICCTPCT,  in the last 72 hours  Coagulation profile  Recent Labs Lab 02/08/13 1400 02/09/13 0330  INR 2.45* 2.31*    No results found for this basename: DDIMER,  in the last 72 hours  Cardiac Enzymes  Recent Labs Lab 02/08/13 2054 02/09/13 0330 02/09/13 0825  TROPONINI <0.30 <0.30 <0.30   ------------------------------------------------------------------------------------------------------------------ No components found with this basename: POCBNP,

## 2013-02-10 NOTE — Progress Notes (Signed)
Awaiting PT eval for [facility] Medicare SNF auth for return to SNF bed at [facility]. Patient and family aware and in agreement with plans- Reece Levy, MSW [phone]

## 2013-02-10 NOTE — Consult Note (Signed)
PULMONARY  / CRITICAL CARE MEDICINE  Name: LALONI ROWTON MRN: [id number] DOB: 1924-09-08    ADMISSION DATE:  02/08/2013 CONSULTATION DATE:  02/10/13  REFERRING MD :  Dr. Thedore Mins  PRIMARY SERVICE:  TRH  CHIEF COMPLAINT:  Abnormal CT Chest / Pulmonary Nodules  BRIEF PATIENT DESCRIPTION: 79 y/o F,Rehab Resident post ORIF L hip, admitted 7/14 with 2 day hx of worsening SOB.  Found to have recurrent large L effusion.  Admitted per [facility] for evaluation.   SIGNIFICANT EVENTS / STUDIES:  7/14 - Admit with SOB, recurrent L left effusion  LINES / TUBES:   CULTURES: UC 7/14>>>70k of multiple morphotypes Pleural GS 7/15>>>neg Pleural Culture 7/15>>>  ANTIBIOTICS:   HISTORY OF PRESENT ILLNESS:  Note, patient is poor historian and no family available.  Information obtained from previous medical documentation.    79 y/o F, with PMH of HTN, CHF (EF of 45-50%, PA Peak 49 02/10/13)), Hypothyroid, Afib on coumadin, Left breast cancer s/p 3 years [facility], known pulmonary nodules followed by Dr. Truett Perna, current rehab resident post ORIF L hip after mechanical fall (June 2014) who presented to [facility] with daughter with 4 day history of worsening shortness of breath.  While in [place] visiting a daughter she was hospitalized in June with fall and discharged to rehab facility.  During that stay, she had L effusion with thoracentesis that was negative for malignant cells (per Dr. Danielle Dess note / review of records). On presentation she was found to have recurrent large left effusion.  Admitted for further evaluation.  Underwent L thoracentesis per IR on 7/15 with LDH ratio of 0.41 and Protein ratio of 0.50.  Cytology pending.  PCCM consulted for further pulmonary evaluation.    PAST MEDICAL HISTORY :  Past Medical History  Diagnosis Date  . Pleural effusion   . Hypertension   . CHF (congestive heart failure)   . Atrial fibrillation   . Hypothyroid   . Breast cancer   . Lung nodule    Past  Surgical History  Procedure Laterality Date  . Joint replacement    . Masectomy Left   . Fracture surgery     Prior to Admission medications   Medication Sig Start Date End Date Taking? Authorizing Provider  atorvastatin (LIPITOR) 40 MG tablet Take 40 mg by mouth daily.   Yes Historical Provider, MD  carvedilol (COREG) 3.125 MG tablet Take 3.125 mg by mouth 2 (two) times daily with a meal.   Yes Historical Provider, MD  citalopram (CELEXA) 20 MG tablet Take 20 mg by mouth daily.  09/04/12  Yes Historical Provider, MD  cyclobenzaprine (FLEXERIL) 5 MG tablet Take 5 mg by mouth every 8 (eight) hours as needed for muscle spasms.   Yes Historical Provider, MD  diltiazem (DILACOR XR) 180 MG 24 hr capsule Take 180 mg by mouth daily.   Yes Historical Provider, MD  furosemide (LASIX) 20 MG tablet Take 20 mg by mouth daily.   Yes Historical Provider, MD  HYDROcodone-acetaminophen (NORCO/VICODIN) 5-325 MG per tablet Take 1 tablet by mouth every 4 (four) hours as needed for pain.   Yes Historical Provider, MD  levothyroxine (SYNTHROID, LEVOTHROID) 112 MCG tablet Take 112 mcg by mouth daily before breakfast.   Yes Historical Provider, MD  Multiple Vitamins-Minerals (MULTIVITAMIN PO) Take 1 tablet by mouth daily.   Yes Historical Provider, MD  warfarin (COUMADIN) 4 MG tablet Take 4 mg by mouth daily.   Yes Historical Provider, MD  zolpidem (AMBIEN) 5 MG  tablet Take 5 mg by mouth at bedtime as needed for sleep.   Yes Historical Provider, MD   No Known Allergies  FAMILY HISTORY:  History reviewed. No pertinent family history.  SOCIAL HISTORY:  reports that she has never smoked. She does not have any smokeless tobacco history on file. She reports that she does not drink alcohol or use illicit drugs.  REVIEW OF SYSTEMS:  Patient denies acute complaints but is poor historian.  See HPI for obtained details.   SUBJECTIVE:   VITAL SIGNS: Temp:  [97.4 F (36.3 C)-99 F (37.2 C)] 97.9 F (36.6 C) (07/16  1328) Pulse Rate:  [70-92] 70 (07/16 1328) Resp:  [18] 18 (07/16 1328) BP: (99-119)/(51-81) 99/51 mmHg (07/16 1328) SpO2:  [96 %-99 %] 99 % (07/16 1328) Weight:  [144 lb 6.4 oz (65.5 kg)] 144 lb 6.4 oz (65.5 kg) (07/16 0351)  PHYSICAL EXAMINATION: General:  wdwn elderly female in NAD Neuro:  Awake, alert, pleasantly confused, oriented to self, place but confused to situation HEENT: mm pink/dry, pupils =, bruising noted to L face (old, yellow / purple) Cardiovascular:  s1s2 irr irr   Lungs:  resp's even/non-labored on [place] O2, left lower with few fine crackles Abdomen:  Round/soft Musculoskeletal:  No acute deformities, L hip surgical scar well healed Skin:  Warm/dry, 1+ pedal edema   Recent Labs Lab 02/08/13 1400 02/09/13 0330 02/10/13 0545  NA 140 140 140  K 4.5 3.8 3.8  CL 102 104 101  CO2 29 27 30   BUN 14 13 13   CREATININE 0.90 0.73 0.74  GLUCOSE 132* 108* 94    Recent Labs Lab 02/08/13 1400 02/09/13 0330 02/10/13 0545  HGB 11.2* 10.2* 12.0  HCT 36.1 32.0* 36.4  WBC 8.4 6.3 7.4  PLT 303 271 298   Dg Chest 1 View  02/09/2013   *RADIOLOGY REPORT*  Clinical Data: Left pleural effusion, status post thoracentesis.  CHEST - 1 VIEW  Comparison: 02/08/2013  Findings: Significant reduction in size of left pleural fluid collection.  Residual density at the left lung apex and left lung base.  Underlying cardiomegaly noted.  Compared to yesterday, there is increased interstitial and patchy airspace opacity favoring edema.  Nodularity in the right mid lung persists.  IMPRESSION: 1.  Reduced size left pleural fluid collection, without pneumothorax. 2.  Compared to yesterday, there is increased interstitial and patchy airspace edema. 3.  Nodularity in the right midlung- malignancy is not excluded. 4.  Mild cardiomegaly.   Original Report Authenticated By: Gaylyn Rong, M.D.   Dg Chest 2 View  02/08/2013   *RADIOLOGY REPORT*  Clinical Data: Shortness of breath, wheezing, history  pleural effusion, breast cancer, atrial fibrillation, hypertension, CHF  CHEST - 2 VIEW  Comparison: 07/01/2012 Correlation:  CT chest 07/28/2012  Findings: Post left mastectomy and axillary node dissection. Enlargement of cardiac silhouette with pulmonary vascular congestion. Moderate to large left pleural effusion with increased atelectasis versus consolidation in left lower lobe. Increased opacity at left lung apex corresponding to the left upper lobe scarring. Additionally, abnormal density is identified at the AP window and left hilum, corresponding to course of the elevated left pulmonary artery on the prior CT.  Underlying emphysematous changes. Streaky opacity at the right base appears to represent a combination of atelectasis and question nodularity as seen on the most recent CT. Right apical scarring stable. Bones diffusely demineralized. No pneumothorax. Atherosclerotic calcification aorta.  IMPRESSION: Moderate to large left pleural effusion with increased atelectasis versus consolidation in lower  left lung. Underlying tumor in the lower left chest is not excluded; clinically indicated consider follow-up CT chest with contrast. Underlying COPD with biapical scarring greater on the left. Scattered chronic interstitial changes are identified with persistent opacities at the right base question representing combination of atelectasis and nodular foci which were seen on the prior CT chest.   Original Report Authenticated By: Ulyses Southward, M.D.   US Thoracentesis Asp Pleural Space W/img Guide  02/09/2013   *RADIOLOGY REPORT*  Clinical Data:  Bilateral pleural effusions; left larger than right  ULTRASOUND GUIDED left THORACENTESIS  Comparison:  None  An ultrasound guided thoracentesis was thoroughly discussed with the patient and questions answered.  The benefits, risks, alternatives and complications were also discussed.  The patient understands and wishes to proceed with the procedure.  Written consent was  obtained.  Ultrasound was performed to localize and mark an adequate pocket of fluid in the left chest.  The area was then prepped and draped in the normal sterile fashion.  1% Lidocaine was used for local anesthesia.  Under ultrasound guidance a 19 gauge Yueh catheter was introduced.  Thoracentesis was performed.  The catheter was removed and a dressing applied.  Complications:  None  Findings: A total of approximately 1 liter of yellow fluid was removed. A fluid sample was sent for laboratory analysis.  IMPRESSION: Successful ultrasound guided left thoracentesis yielding 1 liter of pleural fluid.  Pt started coughing; needed to stop procedure.  Read by: Ralene Muskrat, P.A.-C   Original Report Authenticated By: Tacey Ruiz, MD    ASSESSMENT / PLAN:  Recurrent Left Pleural Effusion - status post thoracentesis in [place] that was negative for malignant cells according to Dr. Danielle Dess review of patient data. Fluid analysis is consistent with transudate.  However, is it concerning for recurrent left effusion in the setting of known breast cancer.   Pulmonary Nodules - chronic, followed with serial CT's per Dr. Truett Perna Pulmonary HTN (based on ECHO)   Plan: -Treat heart failure. -Follow pleural culture & cytology -Consider outpatient PET scan -Follow up with Dr. Truett Perna for serial CT review of pulmonary lesions -Will need to clarify with family goals of care if this indeed malignant process how aggressive medical care should be -Unknown baseline mental status, will need to clarify as well.  ??neurological involvement vs delirium vs dementia  Canary Brim, NP-C [facility]: [phone] or [phone]  02/10/2013, 2:25 PM  Pleural fluid is definitely transudative, no evidence of exudative process.  Recommend treatment of heart failure.  However, more importantly, would recommend discussion with family regarding how aggressive to be with regards to procedures.  Continue  diureses as tolerated and renal function permits.    Little for PCCM to do here, will sign off, please call back if needed.  Patient seen and examined, agree with above note.  I dictated the care and orders written for this patient under my direction.  Alyson Reedy, MD [phone]

## 2013-02-10 NOTE — Consult Note (Signed)
Reason for Consult: Possible CHF  Referring Physician: TRH   HPI: The patient is a 79 y/o female, followed at [facility] by Dr. Allyson Sabal. She has a history of hypertension, hyperlipidemia, chronic atrial fibrillation, on chronic anticoagulation with Coumadin. She also has known pulmonary nodules that are followed by Dr. Truett Perna. Her last 2D echo was in December of 2013 showing an ejection fraction of 45-50%. Wall motion was normal, no wall motion abnormalities. There was mild MR.  The left atrium was mildly dilated. Right ventricular systolic pressure was increased. The PA pressure was 35 mmHg. Right atrium was mildly dilated with severe regurgitation of the tricuspid valve. Her last nuclear stress test was in December of 2013, it was a [employer] and was a normal stress nuclear study. The patient initially presented to the [facility] with a complaint of SOB, prior to being sent to [facility] for evaluation. Apparently, the patient was recently diagnosed with a left pleural effusion at a hospital in [place]. The patient had been visiting her daughter and sustained a fall, resulting in a hip fracture, requiring surgical repair and rehabilitation. While recovering in rehab, she developed severe SOB, that required a second hospitalization, leading to the diagnosis of a pleural effusion. 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 She was transferred back to [place] roughly 2 weeks ago to a local rehab facility and is scheduled to follow up with Dr. Myrle Sheng in August. When she presented to [facility], she had endorsed progressively worsening SOB over the course of several days. She notes only resting dyspnea. She denies orthopnea, PND and LEE. No chest pain, palpations, syncope/presyncope.  Since being in the hospital, she notes improvement in breathing.   Past Medical History  Diagnosis Date  . Pleural effusion   . Hypertension   . CHF (congestive heart failure)   . Atrial fibrillation   .  Hypothyroid   . Breast cancer   . Lung nodule     Past Surgical History  Procedure Laterality Date  . Joint replacement    . Masectomy Left   . Fracture surgery     Family History:   Pt states that she is unaware of any family history of CAD/ heart disease. No known family history of HTN, DM or cancer  Social History:  reports that she has never smoked. She does not have any smokeless tobacco history on file. She reports that she does not drink alcohol or use illicit drugs.  Allergies: No Known Allergies  Medications:  Prior to Admission:  Prescriptions prior to admission  Medication Sig Dispense Refill  . atorvastatin (LIPITOR) 40 MG tablet Take 40 mg by mouth daily.      . carvedilol (COREG) 3.125 MG tablet Take 3.125 mg by mouth 2 (two) times daily with a meal.      . citalopram (CELEXA) 20 MG tablet Take 20 mg by mouth daily.       . cyclobenzaprine (FLEXERIL) 5 MG tablet Take 5 mg by mouth every 8 (eight) hours as needed for muscle spasms.      Marland Kitchen diltiazem (DILACOR XR) 180 MG 24 hr capsule Take 180 mg by mouth daily.      . furosemide (LASIX) 20 MG tablet Take 20 mg by mouth daily.      Marland Kitchen HYDROcodone-acetaminophen (NORCO/VICODIN) 5-325 MG per tablet Take 1 tablet by mouth every 4 (four) hours as needed for pain.      Marland Kitchen levothyroxine (SYNTHROID, LEVOTHROID) 112 MCG tablet Take  112 mcg by mouth daily before breakfast.      . Multiple Vitamins-Minerals (MULTIVITAMIN PO) Take 1 tablet by mouth daily.      Marland Kitchen warfarin (COUMADIN) 4 MG tablet Take 4 mg by mouth daily.      Marland Kitchen zolpidem (AMBIEN) 5 MG tablet Take 5 mg by mouth at bedtime as needed for sleep.        Results for orders placed during the hospital encounter of 02/08/13 (from the past 48 hour(s))  CBC WITH DIFFERENTIAL     Status: Abnormal   Collection Time    02/08/13  2:00 PM      Result Value Range   WBC 8.4  4.0 - 10.5 K/uL   RBC 3.89  3.87 - 5.11 MIL/uL   Hemoglobin 11.2 (*) 12.0 - 15.0 g/dL   HCT 47.8  29.5 -  62.1 %   MCV 92.8  78.0 - 100.0 fL   MCH 28.8  26.0 - 34.0 pg   MCHC 31.0  30.0 - 36.0 g/dL   RDW 30.8 (*) 65.7 - 84.6 %   Platelets 303  150 - 400 K/uL   Neutrophils Relative % 65  43 - 77 %   Neutro Abs 5.5  1.7 - 7.7 K/uL   Lymphocytes Relative 17  12 - 46 %   Lymphs Abs 1.5  0.7 - 4.0 K/uL   Monocytes Relative 12  3 - 12 %   Monocytes Absolute 1.0  0.1 - 1.0 K/uL   Eosinophils Relative 5  0 - 5 %   Eosinophils Absolute 0.4  0.0 - 0.7 K/uL   Basophils Relative 0  0 - 1 %   Basophils Absolute 0.0  0.0 - 0.1 K/uL  BASIC METABOLIC PANEL     Status: Abnormal   Collection Time    02/08/13  2:00 PM      Result Value Range   Sodium 140  135 - 145 mEq/L   Potassium 4.5  3.5 - 5.1 mEq/L   Chloride 102  96 - 112 mEq/L   CO2 29  19 - 32 mEq/L   Glucose, Bld 132 (*) 70 - 99 mg/dL   BUN 14  6 - 23 mg/dL   Creatinine, Ser 9.62  0.50 - 1.10 mg/dL   Calcium 9.3  8.4 - 95.2 mg/dL   GFR calc non Af Amer 56 (*) >90 mL/min   GFR calc Af Amer 65 (*) >90 mL/min   Comment:            The eGFR has been calculated     using the CKD EPI equation.     This calculation has not been     validated in all clinical     situations.     eGFR's persistently     <90 mL/min signify     possible Chronic Kidney Disease.  PRO B NATRIURETIC PEPTIDE     Status: Abnormal   Collection Time    02/08/13  2:00 PM      Result Value Range   Pro B Natriuretic peptide (BNP) 2136.0 (*) 0 - 450 pg/mL  TROPONIN I     Status: None   Collection Time    02/08/13  2:00 PM      Result Value Range   Troponin I <0.30  <0.30 ng/mL   Comment:            Due to the release kinetics of cTnI,     a negative result within the  first hours     of the onset of symptoms does not rule out     myocardial infarction with certainty.     If myocardial infarction is still suspected,     repeat the test at appropriate intervals.  PROTIME-INR     Status: Abnormal   Collection Time    02/08/13  2:00 PM      Result Value Range    Prothrombin Time 25.8 (*) 11.6 - 15.2 seconds   INR 2.45 (*) 0.00 - 1.49  URINALYSIS, ROUTINE W REFLEX MICROSCOPIC     Status: Abnormal   Collection Time    02/08/13  5:58 PM      Result Value Range   Color, Urine YELLOW  YELLOW   APPearance CLOUDY (*) CLEAR   Specific Gravity, Urine 1.018  1.005 - 1.030   pH 6.5  5.0 - 8.0   Glucose, UA NEGATIVE  NEGATIVE mg/dL   Hgb urine dipstick NEGATIVE  NEGATIVE   Bilirubin Urine NEGATIVE  NEGATIVE   Ketones, ur NEGATIVE  NEGATIVE mg/dL   Protein, ur NEGATIVE  NEGATIVE mg/dL   Urobilinogen, UA 1.0  0.0 - 1.0 mg/dL   Nitrite NEGATIVE  NEGATIVE   Leukocytes, UA SMALL (*) NEGATIVE  URINE MICROSCOPIC-ADD ON     Status: Abnormal   Collection Time    02/08/13  5:58 PM      Result Value Range   Squamous Epithelial / LPF FEW (*) RARE   WBC, UA 3-6  <3 WBC/hpf   Bacteria, UA FEW (*) RARE   Crystals CA OXALATE CRYSTALS (*) NEGATIVE  URINE CULTURE     Status: None   Collection Time    02/08/13  5:58 PM      Result Value Range   Specimen Description URINE, CLEAN CATCH     Special Requests NONE     Culture  Setup Time 02/09/2013 00:42     Colony Count 70,000 COLONIES/ML     Culture       Value: Multiple bacterial morphotypes present, none predominant. Suggest appropriate recollection if clinically indicated.   Report Status 02/09/2013 FINAL    TROPONIN I     Status: None   Collection Time    02/08/13  8:54 PM      Result Value Range   Troponin I <0.30  <0.30 ng/mL   Comment:            Due to the release kinetics of cTnI,     a negative result within the first hours     of the onset of symptoms does not rule out     myocardial infarction with certainty.     If myocardial infarction is still suspected,     repeat the test at appropriate intervals.  TSH     Status: Abnormal   Collection Time    02/08/13  8:54 PM      Result Value Range   TSH 6.479 (*) 0.350 - 4.500 uIU/mL  PROTEIN, TOTAL     Status: None   Collection Time    02/08/13   8:54 PM      Result Value Range   Total Protein 6.6  6.0 - 8.3 g/dL  PROTIME-INR     Status: Abnormal   Collection Time    02/09/13  3:30 AM      Result Value Range   Prothrombin Time 24.6 (*) 11.6 - 15.2 seconds   INR 2.31 (*) 0.00 - 1.49  CBC  Status: Abnormal   Collection Time    02/09/13  3:30 AM      Result Value Range   WBC 6.3  4.0 - 10.5 K/uL   RBC 3.56 (*) 3.87 - 5.11 MIL/uL   Hemoglobin 10.2 (*) 12.0 - 15.0 g/dL   HCT 29.5 (*) 62.1 - 30.8 %   MCV 89.9  78.0 - 100.0 fL   MCH 28.7  26.0 - 34.0 pg   MCHC 31.9  30.0 - 36.0 g/dL   RDW 65.7 (*) 84.6 - 96.2 %   Platelets 271  150 - 400 K/uL  BASIC METABOLIC PANEL     Status: Abnormal   Collection Time    02/09/13  3:30 AM      Result Value Range   Sodium 140  135 - 145 mEq/L   Potassium 3.8  3.5 - 5.1 mEq/L   Chloride 104  96 - 112 mEq/L   CO2 27  19 - 32 mEq/L   Glucose, Bld 108 (*) 70 - 99 mg/dL   BUN 13  6 - 23 mg/dL   Creatinine, Ser 9.52  0.50 - 1.10 mg/dL   Calcium 8.6  8.4 - 84.1 mg/dL   GFR calc non Af Amer 75 (*) >90 mL/min   GFR calc Af Amer 87 (*) >90 mL/min   Comment:            The eGFR has been calculated     using the CKD EPI equation.     This calculation has not been     validated in all clinical     situations.     eGFR's persistently     <90 mL/min signify     possible Chronic Kidney Disease.  TROPONIN I     Status: None   Collection Time    02/09/13  3:30 AM      Result Value Range   Troponin I <0.30  <0.30 ng/mL   Comment:            Due to the release kinetics of cTnI,     a negative result within the first hours     of the onset of symptoms does not rule out     myocardial infarction with certainty.     If myocardial infarction is still suspected,     repeat the test at appropriate intervals.  TROPONIN I     Status: None   Collection Time    02/09/13  8:25 AM      Result Value Range   Troponin I <0.30  <0.30 ng/mL   Comment:            Due to the release kinetics of cTnI,      a negative result within the first hours     of the onset of symptoms does not rule out     myocardial infarction with certainty.     If myocardial infarction is still suspected,     repeat the test at appropriate intervals.  HEPATIC FUNCTION PANEL     Status: Abnormal   Collection Time    02/09/13 12:30 PM      Result Value Range   Total Protein 6.5  6.0 - 8.3 g/dL   Albumin 2.6 (*) 3.5 - 5.2 g/dL   AST 22  0 - 37 U/L   ALT 14  0 - 35 U/L   Alkaline Phosphatase 118 (*) 39 - 117 U/L   Total Bilirubin 0.8  0.3 - 1.2  mg/dL   Bilirubin, Direct 0.2  0.0 - 0.3 mg/dL   Indirect Bilirubin 0.6  0.3 - 0.9 mg/dL  LACTATE DEHYDROGENASE     Status: None   Collection Time    02/09/13 12:30 PM      Result Value Range   LDH 249  94 - 250 U/L  GRAM STAIN     Status: None   Collection Time    02/09/13  2:29 PM      Result Value Range   Specimen Description FLUID PLEURAL LEFT     Special Requests FLUID     Gram Stain       Value: NO WBC SEEN     NO ORGANISMS SEEN   Report Status 02/09/2013 FINAL    BODY FLUID CELL COUNT WITH DIFFERENTIAL     Status: Abnormal   Collection Time    02/09/13  2:29 PM      Result Value Range   Fluid Type-FCT FLUID     Comment: PLEURAL     LEFT     CORRECTED ON 07/15 AT 1527: PREVIOUSLY REPORTED AS LUNG   Color, Fluid YELLOW (*) YELLOW   Appearance, Fluid CLOUDY (*) CLEAR   WBC, Fluid 899  0 - 1000 cu mm   Neutrophil Count, Fluid 1  0 - 25 %   Lymphs, Fluid 93     Monocyte-Macrophage-Serous Fluid 6 (*) 50 - 90 %   Eos, Fluid 0     Other Cells, Fluid 0    BODY FLUID CULTURE     Status: None   Collection Time    02/09/13  2:29 PM      Result Value Range   Specimen Description FLUID PLEURAL LEFT     Special Requests FLUID     Gram Stain       Value: NO WBC SEEN     NO ORGANISMS SEEN     Performed at [facility]   Culture NO GROWTH     Report Status PENDING    LACTATE DEHYDROGENASE, BODY FLUID     Status: Abnormal   Collection Time     02/09/13  2:29 PM      Result Value Range   LD, Fluid 123 (*) 3 - 23 U/L   Fluid Type-FLDH FLUID     Comment: PLEURAL     LEFT     CORRECTED ON 07/15 AT 1527: PREVIOUSLY REPORTED AS LUNG  PROTEIN, BODY FLUID     Status: None   Collection Time    02/09/13  2:29 PM      Result Value Range   Total protein, fluid 3.3     Comment: NO NORMAL RANGE ESTABLISHED FOR THIS TEST   Fluid Type-FTP FLUID     Comment: PLEURAL     LEFT     CORRECTED ON 07/15 AT 1525: PREVIOUSLY REPORTED AS LUNG  BASIC METABOLIC PANEL     Status: Abnormal   Collection Time    02/10/13  5:45 AM      Result Value Range   Sodium 140  135 - 145 mEq/L   Potassium 3.8  3.5 - 5.1 mEq/L   Chloride 101  96 - 112 mEq/L   CO2 30  19 - 32 mEq/L   Glucose, Bld 94  70 - 99 mg/dL   BUN 13  6 - 23 mg/dL   Creatinine, Ser 1.61  0.50 - 1.10 mg/dL   Calcium 8.7  8.4 - 09.6 mg/dL  GFR calc non Af Amer 75 (*) >90 mL/min   GFR calc Af Amer 87 (*) >90 mL/min   Comment:            The eGFR has been calculated     using the CKD EPI equation.     This calculation has not been     validated in all clinical     situations.     eGFR's persistently     <90 mL/min signify     possible Chronic Kidney Disease.  CBC     Status: Abnormal   Collection Time    02/10/13  5:45 AM      Result Value Range   WBC 7.4  4.0 - 10.5 K/uL   RBC 4.09  3.87 - 5.11 MIL/uL   Hemoglobin 12.0  12.0 - 15.0 g/dL   HCT 16.1  09.6 - 04.5 %   MCV 89.0  78.0 - 100.0 fL   MCH 29.3  26.0 - 34.0 pg   MCHC 33.0  30.0 - 36.0 g/dL   RDW 40.9 (*) 81.1 - 91.4 %   Platelets 298  150 - 400 K/uL  T4, FREE     Status: None   Collection Time    02/10/13  5:45 AM      Result Value Range   Free T4 1.36  0.80 - 1.80 ng/dL    Dg Chest 1 View  7/82/9562   *RADIOLOGY REPORT*  Clinical Data: Left pleural effusion, status post thoracentesis.  CHEST - 1 VIEW  Comparison: 02/08/2013  Findings: Significant reduction in size of left pleural fluid collection.  Residual  density at the left lung apex and left lung base.  Underlying cardiomegaly noted.  Compared to yesterday, there is increased interstitial and patchy airspace opacity favoring edema.  Nodularity in the right mid lung persists.  IMPRESSION: 1.  Reduced size left pleural fluid collection, without pneumothorax. 2.  Compared to yesterday, there is increased interstitial and patchy airspace edema. 3.  Nodularity in the right midlung- malignancy is not excluded. 4.  Mild cardiomegaly.   Original Report Authenticated By: Gaylyn Rong, M.D.   Dg Chest 2 View  02/08/2013   *RADIOLOGY REPORT*  Clinical Data: Shortness of breath, wheezing, history pleural effusion, breast cancer, atrial fibrillation, hypertension, CHF  CHEST - 2 VIEW  Comparison: 07/01/2012 Correlation:  CT chest 07/28/2012  Findings: Post left mastectomy and axillary node dissection. Enlargement of cardiac silhouette with pulmonary vascular congestion. Moderate to large left pleural effusion with increased atelectasis versus consolidation in left lower lobe. Increased opacity at left lung apex corresponding to the left upper lobe scarring. Additionally, abnormal density is identified at the AP window and left hilum, corresponding to course of the elevated left pulmonary artery on the prior CT.  Underlying emphysematous changes. Streaky opacity at the right base appears to represent a combination of atelectasis and question nodularity as seen on the most recent CT. Right apical scarring stable. Bones diffusely demineralized. No pneumothorax. Atherosclerotic calcification aorta.  IMPRESSION: Moderate to large left pleural effusion with increased atelectasis versus consolidation in lower left lung. Underlying tumor in the lower left chest is not excluded; clinically indicated consider follow-up CT chest with contrast. Underlying COPD with biapical scarring greater on the left. Scattered chronic interstitial changes are identified with persistent opacities  at the right base question representing combination of atelectasis and nodular foci which were seen on the prior CT chest.   Original Report Authenticated By: Ulyses Southward, M.D.  US Thoracentesis Asp Pleural Space W/img Guide  02/09/2013   *RADIOLOGY REPORT*  Clinical Data:  Bilateral pleural effusions; left larger than right  ULTRASOUND GUIDED left THORACENTESIS  Comparison:  None  An ultrasound guided thoracentesis was thoroughly discussed with the patient and questions answered.  The benefits, risks, alternatives and complications were also discussed.  The patient understands and wishes to proceed with the procedure.  Written consent was obtained.  Ultrasound was performed to localize and mark an adequate pocket of fluid in the left chest.  The area was then prepped and draped in the normal sterile fashion.  1% Lidocaine was used for local anesthesia.  Under ultrasound guidance a 19 gauge Yueh catheter was introduced.  Thoracentesis was performed.  The catheter was removed and a dressing applied.  Complications:  None  Findings: A total of approximately 1 liter of yellow fluid was removed. A fluid sample was sent for laboratory analysis.  IMPRESSION: Successful ultrasound guided left thoracentesis yielding 1 liter of pleural fluid.  Pt started coughing; needed to stop procedure.  Read by: Ralene Muskrat, P.A.-C   Original Report Authenticated By: Tacey Ruiz, MD    Review of Systems  Constitutional: Negative for fever and chills.  Respiratory: Positive for shortness of breath. Negative for cough, hemoptysis and sputum production.   Cardiovascular: Negative for chest pain, palpitations, orthopnea, leg swelling and PND.  All other systems reviewed and are negative.   Blood pressure 99/51, pulse 70, temperature 97.9 F (36.6 C), temperature source Oral, resp. rate 18, height 5\' 3"  (1.6 m), weight 144 lb 6.4 oz (65.5 kg), SpO2 99.00%. Physical Exam  Constitutional: She is oriented to person, place,  and time. She appears well-developed and well-nourished. No distress.  HENT:  Head: Normocephalic and atraumatic.  Eyes: Conjunctivae and EOM are normal. Pupils are equal, round, and reactive to light.  Neck: Hepatojugular reflux and JVD present. No thyromegaly present.  Cardiovascular: Normal rate and intact distal pulses.  An irregularly irregular rhythm present. Exam reveals no gallop and no friction rub.   No murmur heard. Pulses:      Radial pulses are 2+ on the right side, and 2+ on the left side.       Dorsalis pedis pulses are 2+ on the right side, and 2+ on the left side.  Respiratory: Effort normal. No respiratory distress. She has no wheezes. Rales: bibasilar. She exhibits no tenderness.  Decreased breath sounds over LLL   GI: Soft. Bowel sounds are normal. She exhibits no distension and no mass.  Musculoskeletal: She exhibits no edema.  Lymphadenopathy:    She has no cervical adenopathy.  Neurological: She is alert and oriented to person, place, and time.  Skin: Skin is warm and dry. She is not diaphoretic.  Psychiatric: She has a normal mood and affect. Her behavior is normal.    Assessment/Plan: Principal Problem:   Pleural effusion on left Active Problems:   Atrial fibrillation   Long term (current) use of anticoagulants   Hypertension   Lung nodule   SOB (shortness of breath)   Chronic respiratory failure with hypoxia  Plan: SOB is likely multifactorial. Pt noted to have large left pleural effusion on CXR. Thoracentesis performed earlier today. Pleural fluid has been sent for cytology. Results pending. Pt also noted to have an elevated BNP at time of admission 2 days ago. BNP was 2,136. She has normal renal function. A 2D echo, performed on 7/15 demonstrates no change in systolic function compared to  prior study in 2013. EF remains mildly reduced in the 45-50% range. On exam today, she was noted to have JVD with + HJR. She did receive IV Lasix, now on PO, and has  diuresed ~2.2 L since admission. Will order another BNP to see if there has been any improvement. I have reviewed her current medications. Would not make any changes at this point.  In regards to other cardiac issues, her a-fib is rate controlled. Continue on BB and CCB. Will need to monitor BP. Most recent BP is 99/51. INR is therapeutic at 2.31. MD to follow with further recommendations.   Allayne Butcher, PA-C 02/10/2013, 1:49 PM   I have seen and evaluated the patient this PM along with  Boyce Medici, PA. I agree with her findings, examination as well as impression recommendations.  Probably indolent course of fluid accumulation.  Interestingly, her EF is only mildy reduced but not different from previous studies, but the RV was dilated with elevated RV Pressures.  This would argue for a more diastolic decompensation. (with ectopy &/or afib - assessing regional WMA is difficult, so not sure if there truly is a new anterior WMA) -- no Sx to suggest ischeima.  She seems to be doing well with gentle diuresis -- agree with PO only, as long as she is responding.  Should improve after thoracentesis - but repeat CXR looks like some possible re-expansion edema.   BPs are borderline, so afterload reduction is not an option.    [facility] will continue to monitor, but do not have much to add to [facility] MD's care plan.  MD Time with pt: 10 min  Sarah Zerby W, M.D., M.S. [facility] [address] [place], [place]  [address]  [phone] Pager # [phone] 02/10/2013 7:05 PM

## 2013-02-10 NOTE — Progress Notes (Signed)
ANTICOAGULATION CONSULT NOTE - Initial Consult  Pharmacy Consult for Coumadin Indication: history of  atrial fibrillation  No Known Allergies  Patient Measurements: Height: 5\' 3"  (160 cm) Weight: 144 lb 6.4 oz (65.5 kg) IBW/kg (Calculated) : 52.4   Vital Signs: Temp: 97.4 F (36.3 C) (07/16 0351) Temp src: Oral (07/16 0351) BP: 117/70 mmHg (07/16 0949) Pulse Rate: 92 (07/16 0949)  Labs:  Recent Labs  02/08/13 1400 02/08/13 2054 02/09/13 0330 02/09/13 0825 02/10/13 0545  HGB 11.2*  --  10.2*  --  12.0  HCT 36.1  --  32.0*  --  36.4  PLT 303  --  271  --  298  LABPROT 25.8*  --  24.6*  --   --   INR 2.45*  --  2.31*  --   --   CREATININE 0.90  --  0.73  --  0.74  TROPONINI <0.30 <0.30 <0.30 <0.30  --     Estimated Creatinine Clearance: 45.9 ml/min (by C-G formula based on Cr of 0.74).   Medical History: Past Medical History  Diagnosis Date  . Pleural effusion   . Hypertension   . CHF (congestive heart failure)   . Atrial fibrillation   . Hypothyroid   . Breast cancer   . Lung nodule     Medications:  Prescriptions prior to admission  Medication Sig Dispense Refill  . atorvastatin (LIPITOR) 40 MG tablet Take 40 mg by mouth daily.      . carvedilol (COREG) 3.125 MG tablet Take 3.125 mg by mouth 2 (two) times daily with a meal.      . citalopram (CELEXA) 20 MG tablet Take 20 mg by mouth daily.       . cyclobenzaprine (FLEXERIL) 5 MG tablet Take 5 mg by mouth every 8 (eight) hours as needed for muscle spasms.      Marland Kitchen diltiazem (DILACOR XR) 180 MG 24 hr capsule Take 180 mg by mouth daily.      . furosemide (LASIX) 20 MG tablet Take 20 mg by mouth daily.      Marland Kitchen HYDROcodone-acetaminophen (NORCO/VICODIN) 5-325 MG per tablet Take 1 tablet by mouth every 4 (four) hours as needed for pain.      Marland Kitchen levothyroxine (SYNTHROID, LEVOTHROID) 112 MCG tablet Take 112 mcg by mouth daily before breakfast.      . Multiple Vitamins-Minerals (MULTIVITAMIN PO) Take 1 tablet by mouth  daily.      Marland Kitchen warfarin (COUMADIN) 4 MG tablet Take 4 mg by mouth daily.      Marland Kitchen zolpidem (AMBIEN) 5 MG tablet Take 5 mg by mouth at bedtime as needed for sleep.       Scheduled:  . atorvastatin  40 mg Oral q1800  . carvedilol  3.125 mg Oral BID WC  . citalopram  20 mg Oral Daily  . diltiazem  180 mg Oral Daily  . furosemide  40 mg Intravenous Daily  . levothyroxine  112 mcg Oral QAC breakfast  . sodium chloride  3 mL Intravenous Q12H  . Warfarin - Pharmacist Dosing Inpatient   Does not apply q1800    Assessment: 79 y.o female with history of atrial fibrillation.  On chronic coumadin 4 mg daily prior to admission.  Admitted on 02/08/13 and coumadin held for a diagnostic/therapeuitc thoracentesis.  Thoracentesis done 02/09/13.  Dr. Ella Jubilee has ordered coumadin to be resumed today.  Last coumadin dose taken PTA on 02/07/13.   INR 2.31 on 02/09/13, down from 2.45 on 02/08/13.  H/H  12.0/36.4, PLTC 298K stable. No bleeding noted.   Goal of Therapy:  INR 2-3 Monitor platelets by anticoagulation protocol: Yes   Plan:  Coumadin 5 mg po today. Protime/INR daily starting 02/11/13 AM.   Noah Delaine, RPh Clinical Pharmacist Pager: [phone] 02/10/2013,10:44 AM

## 2013-02-10 NOTE — Evaluation (Signed)
Physical Therapy Evaluation Patient Details Name: Leah Guerra MRN: [id number] DOB: 1924/10/04 Today's Date: 02/10/2013 Time: 4742-5956 PT Time Calculation (min): 25 min  PT Assessment / Plan / Recommendation History of Present Illness    79 y/o F,Rehab Resident post ORIF L hip, admitted 7/14 with 2 day hx of worsening SOB. Found to have recurrent large L effusion.  History of hip fracture in late May.    Clinical Impression  Pt demonstrates deficits in functional mobility and safety as indicated. Feel patient will benefit from continued skilled PT to address deficits and maximize function. Will continue to see as indicated. rec d/c to SNF for further rehab follow up at discharge.    PT Assessment  Patient needs continued PT services    Follow Up Recommendations  SNF          Equipment Recommendations  None recommended by PT    Recommendations for Other Services     Frequency Min 2X/week    Precautions / Restrictions Precautions Precautions:  (pt states posterior hip precautions) Precaution Comments: patient able to recall 3/3 precautions (question if still in effect) Restrictions Weight Bearing Restrictions:  (none inidicated)   Pertinent Vitals/Pain Pt reports no pain at this time; ambulated on 3 liters      Mobility  Bed Mobility Bed Mobility: Supine to Sit;Sitting - Scoot to [place] of Bed;Sit to Supine Supine to Sit: 4: Min assist Sitting - Scoot to [place] of Bed: 4: Min guard Sit to Supine: 4: Min assist Details for Bed Mobility Assistance: Assist for trunk rotation and le movement  Transfers Transfers: Sit to Stand;Stand to Sit Sit to Stand: 4: Min assist Stand to Sit: 4: Min guard Details for Transfer Assistance: VCs for safe hand placement using rw; assist fro stability and elevation from bed Ambulation/Gait Ambulation/Gait Assistance: 4: Min guard;4: Min Environmental consultant (Feet): 50 Feet Assistive device: Rolling walker Ambulation/Gait  Assistance Details: some instability and RLE knee buckling noted during ambulation. Pt with DOE and fatigue Gait Pattern: Step-through pattern;Decreased stride length;Right flexed knee in stance;Trunk flexed;Narrow base of support Gait velocity: decreased General Gait Details: some instability noted with ambulation        PT Diagnosis: Difficulty walking;Abnormality of gait;Generalized weakness  PT Problem List: Decreased strength;Decreased range of motion;Decreased activity tolerance;Decreased balance;Decreased mobility;Decreased cognition;Decreased knowledge of use of DME;Decreased safety awareness PT Treatment Interventions: DME instruction;Gait training;Functional mobility training;Therapeutic activities;Therapeutic exercise;Balance training;Patient/family education     PT Goals(Current goals can be found in the care plan section) Acute Rehab PT Goals PT Goal Formulation: With patient Time For Goal Achievement: 02/24/13 Potential to Achieve Goals: Fair  Visit Information  Last PT Received On: 02/10/13 Assistance Needed: +1       Prior Functioning  Home Living Family/patient expects to be discharged to:: Skilled nursing facility Prior Function Level of Independence: Needs assistance Comments: pt poor historian Communication Communication: HOH;Other (comment) (poor historian) Dominant Hand: Right    Cognition  Cognition Arousal/Alertness: Awake/alert Behavior During Therapy: WFL for tasks assessed/performed Overall Cognitive Status: No family/caregiver present to determine baseline cognitive functioning    Extremity/Trunk Assessment Upper Extremity Assessment Upper Extremity Assessment: Generalized weakness Lower Extremity Assessment Lower Extremity Assessment: Generalized weakness      End of Session PT - End of Session Equipment Utilized During Treatment: Gait belt Activity Tolerance: Patient limited by fatigue Patient left: in bed;with call bell/phone within  reach;with bed alarm set Nurse Communication: Mobility status;Precautions (pt reports posterior precautions)  GP  Fabio Asa 02/10/2013, 3:56 PM Charlotte Crumb, PT DPT  [phone]

## 2013-02-11 ENCOUNTER — Inpatient Hospital Stay (HOSPITAL_COMMUNITY): Payer: Medicare Other

## 2013-02-11 DIAGNOSIS — I5031 Acute diastolic (congestive) heart failure: Secondary | ICD-10-CM | POA: Diagnosis present

## 2013-02-11 LAB — PROTIME-INR: INR: 1.58 — ABNORMAL HIGH (ref 0.00–1.49)

## 2013-02-11 LAB — POTASSIUM: Potassium: 3.5 mEq/L (ref 3.5–5.1)

## 2013-02-11 MED ORDER — POTASSIUM CHLORIDE CRYS ER 20 MEQ PO TBCR: 20.0000 meq | EXTENDED_RELEASE_TABLET | Freq: Every day | ORAL | Status: DC

## 2013-02-11 MED ORDER — FUROSEMIDE 40 MG PO TABS: 40.0000 mg | ORAL_TABLET | Freq: Two times a day (BID) | ORAL | Status: DC

## 2013-02-11 MED ORDER — HYDROCODONE-ACETAMINOPHEN 5-325 MG PO TABS: 1.0000 | ORAL_TABLET | ORAL | Status: DC | PRN

## 2013-02-11 NOTE — Discharge Summary (Signed)
[facility]                                                                                   Leah Guerra, is a 79 y.o. female  DOB 1925-03-15  MRN [id number].  Admission date:  02/08/2013  Discharge Date:  02/11/2013  Primary MD  Daisy Floro, MD  Admitting Physician  Joseph Art, DO  Admission Diagnosis  Shortness of breath [786.05] Long term (current) use of anticoagulants [V58.61] A-fib [427.31] Pleural effusion on left [511.9]  Discharge Diagnosis     Principal Problem:   Pleural effusion on left Active Problems:   Chronic atrial fibrillation   Long term (current) use of anticoagulants   Hypertension   Lung nodule   SOB (shortness of breath)   Chronic respiratory failure with hypoxia       Past Medical History  Diagnosis Date  . Pleural effusion   . Hypertension   . CHF (congestive heart failure)   . Atrial fibrillation   . Hypothyroid   . Breast cancer   . Lung nodule     Past Surgical History  Procedure Laterality Date  . Joint replacement    . Masectomy Left   . Fracture surgery       Recommendations for primary care physician for things to follow:   Please follow patient's weight, blood pressure, BMP and diuretic dose closely   Discharge Diagnoses:   Principal Problem:   Pleural effusion on left Active Problems:   Chronic atrial fibrillation   Long term (current) use of anticoagulants   Hypertension   Lung nodule   SOB (shortness of breath)   Chronic respiratory failure with hypoxia    Discharge Condition:stable   Diet recommendation: See Discharge Instructions below   Consults cardiology, pulmonary, oncology    History of present illness and  Hospital Course:     Kindly see H&P for history of present illness and admission details, please review complete Labs, Consult reports and Test reports for all details in brief Leah Guerra, is a 79 y.o. female, patient with history of chronic atrial  fibrillation, long term Coumadin treatment, breast cancer under the care of Dr. Truett Perna, hypertension, lung nodule was admitted to the hospital with chief complaints of shortness of breath along with acute hypoxic respiratory failure on chronic due to acute on chronic systolic heart failure EF being 45% causing large bilateral left more than right pleural effusion.  She was seen by cardiology along with pulmonary, she underwent ultrasound-guided thoracentesis with large amounts of pleural fluid which appeared to be transudate removed from the left side with good effect, she's not short of breath right now, pulmonary and cardiology have recommended continued diuresis and medical management. I have increased her Lasix dose and will request her primary care physician and cardiologist to please monitor her weight, BMP, blood pressure and diuretic dose very closely. Coumadin will be resumed as her invasive procedure is done, outpatient monitoring of INR is recommended on a close basis.   Her atrial fibrillation is stable on diltiazem, Coumadin to be continued with close INR monitoring. Hypertension is in control with combination of Coreg and calcium channel blocker  however her blood pressure needs to be closely monitored as her diuretic dose has been increased.    Also has history of breast cancer which is thought to be in remission she will continue to followup with Dr. Truett Perna as outpatient post discharge.   His history of chronic hypoxic respiratory failure currently she is requiring between 1 L nasal cannula oxygen to none, she will continue to wear a nasal cannula oxygen as needed per home regimen. Her home regimen was 2 L nasal cannula oxygen.   She also has generalized deconditioning and possible early dementia, she was seen by physical therapy who recommended rehabilitation placement, patient will be discharged to a rehabilitation facility if patient and family are agreeable orders home with home  PT.      Today   Subjective:   Leah Guerra today has no headache,no chest abdominal pain,no new weakness tingling or numbness, feels much better.  Objective:   Blood pressure 107/64, pulse 86, temperature 97.5 F (36.4 C), temperature source Oral, resp. rate 18, height 5\' 3"  (1.6 m), weight 64.5 kg (142 lb 3.2 oz), SpO2 99.00%.   Intake/Output Summary (Last 24 hours) at 02/11/13 0851 Last data filed at 02/11/13 0734  Gross per 24 hour  Intake    720 ml  Output   1851 ml  Net  -1131 ml    Exam Awake Alert, Oriented *3, No new F.N deficits, Normal affect [place].AT,PERRAL Supple Neck,No JVD, No cervical lymphadenopathy appriciated.  Symmetrical Chest wall movement, Good air movement bilaterally, CTAB RRR,No Gallops,Rubs or new Murmurs, No Parasternal Heave +ve B.Sounds, Abd Soft, Non tender, No organomegaly appriciated, No rebound -guarding or rigidity. No Cyanosis, Clubbing or edema, No new Rash or bruise  Data Review   Major procedures and Radiology Reports - PLEASE review detailed and final reports for all details in brief -     Echo  - Left ventricle: The cavity size was at the upper limits of normal. Wall thickness was normal. Systolic function was mildly reduced. The estimated ejection fraction was 50%, in the range of 45% to 50%. Diffuse hypokinesis. - Regional wall motion abnormality: Moderate hypokinesis of the mid anteroseptal myocardium; mild hypokinesis of the basal anteroseptal myocardium. - Aortic valve: Mild focal calcification, nodularity, and sclerosis without stenosis involving the right coronary, left coronary, and noncoronary cusp. - Mitral valve: Mildly dilated, mildly calcified annulus. Mild focal thickening, elongation, and tethering of the anterior leaflet, consistent with rheumatic disease. Trivial, late systolicsystolic bowing without prolapse, involving the anterior leaflet. Mild regurgitation directed posteriorly and toward the free wall. -  Left atrium: The atrium was severely dilated. - Right ventricle: The cavity size was mildly to moderately dilated. Wall thickness was normal. - Right atrium: The atrium was massively dilated. - Tricuspid valve: Mild-moderate regurgitation. - Pulmonary arteries: Systolic pressure was moderately increased. PA peak pressure: 49mm Hg (S). - Pericardium, extracardiac: A trivial pericardial effusion was identified posterior to the heart. There was a left pleural effusion    Dg Chest 1 View  02/09/2013   *RADIOLOGY REPORT*  Clinical Data: Left pleural effusion, status post thoracentesis.  CHEST - 1 VIEW  Comparison: 02/08/2013  Findings: Significant reduction in size of left pleural fluid collection.  Residual density at the left lung apex and left lung base.  Underlying cardiomegaly noted.  Compared to yesterday, there is increased interstitial and patchy airspace opacity favoring edema.  Nodularity in the right mid lung persists.  IMPRESSION: 1.  Reduced size left pleural fluid collection, without  pneumothorax. 2.  Compared to yesterday, there is increased interstitial and patchy airspace edema. 3.  Nodularity in the right midlung- malignancy is not excluded. 4.  Mild cardiomegaly.   Original Report Authenticated By: Gaylyn Rong, M.D.   Dg Chest 2 View  02/08/2013   *RADIOLOGY REPORT*  Clinical Data: Shortness of breath, wheezing, history pleural effusion, breast cancer, atrial fibrillation, hypertension, CHF  CHEST - 2 VIEW  Comparison: 07/01/2012 Correlation:  CT chest 07/28/2012  Findings: Post left mastectomy and axillary node dissection. Enlargement of cardiac silhouette with pulmonary vascular congestion. Moderate to large left pleural effusion with increased atelectasis versus consolidation in left lower lobe. Increased opacity at left lung apex corresponding to the left upper lobe scarring. Additionally, abnormal density is identified at the AP window and left hilum, corresponding to course of  the elevated left pulmonary artery on the prior CT.  Underlying emphysematous changes. Streaky opacity at the right base appears to represent a combination of atelectasis and question nodularity as seen on the most recent CT. Right apical scarring stable. Bones diffusely demineralized. No pneumothorax. Atherosclerotic calcification aorta.  IMPRESSION: Moderate to large left pleural effusion with increased atelectasis versus consolidation in lower left lung. Underlying tumor in the lower left chest is not excluded; clinically indicated consider follow-up CT chest with contrast. Underlying COPD with biapical scarring greater on the left. Scattered chronic interstitial changes are identified with persistent opacities at the right base question representing combination of atelectasis and nodular foci which were seen on the prior CT chest.   Original Report Authenticated By: Ulyses Southward, M.D.   US Thoracentesis Asp Pleural Space W/img Guide  02/09/2013   *RADIOLOGY REPORT*  Clinical Data:  Bilateral pleural effusions; left larger than right  ULTRASOUND GUIDED left THORACENTESIS  Comparison:  None  An ultrasound guided thoracentesis was thoroughly discussed with the patient and questions answered.  The benefits, risks, alternatives and complications were also discussed.  The patient understands and wishes to proceed with the procedure.  Written consent was obtained.  Ultrasound was performed to localize and mark an adequate pocket of fluid in the left chest.  The area was then prepped and draped in the normal sterile fashion.  1% Lidocaine was used for local anesthesia.  Under ultrasound guidance a 19 gauge Yueh catheter was introduced.  Thoracentesis was performed.  The catheter was removed and a dressing applied.  Complications:  None  Findings: A total of approximately 1 liter of yellow fluid was removed. A fluid sample was sent for laboratory analysis.  IMPRESSION: Successful ultrasound guided left thoracentesis  yielding 1 liter of pleural fluid.  Pt started coughing; needed to stop procedure.  Read by: Ralene Muskrat, P.A.-C   Original Report Authenticated By: Tacey Ruiz, MD    Micro Results      Recent Results (from the past 240 hour(s))  URINE CULTURE     Status: None   Collection Time    02/08/13  5:58 PM      Result Value Range Status   Specimen Description URINE, CLEAN CATCH   Final   Special Requests NONE   Final   Culture  Setup Time 02/09/2013 00:42   Final   Colony Count 70,000 COLONIES/ML   Final   Culture     Final   Value: Multiple bacterial morphotypes present, none predominant. Suggest appropriate recollection if clinically indicated.   Report Status 02/09/2013 FINAL   Final  GRAM STAIN     Status: None   Collection  Time    02/09/13  2:29 PM      Result Value Range Status   Specimen Description FLUID PLEURAL LEFT   Final   Special Requests FLUID   Final   Gram Stain     Final   Value: NO WBC SEEN     NO ORGANISMS SEEN   Report Status 02/09/2013 FINAL   Final  BODY FLUID CULTURE     Status: None   Collection Time    02/09/13  2:29 PM      Result Value Range Status   Specimen Description FLUID PLEURAL LEFT   Final   Special Requests FLUID   Final   Gram Stain     Final   Value: NO WBC SEEN     NO ORGANISMS SEEN     Performed at [facility]   Culture NO GROWTH   Final   Report Status PENDING   Incomplete     CBC w Diff: Lab Results  Component Value Date   WBC 7.4 02/10/2013   HGB 12.0 02/10/2013   HCT 36.4 02/10/2013   PLT 298 02/10/2013   LYMPHOPCT 17 02/08/2013   MONOPCT 12 02/08/2013   EOSPCT 5 02/08/2013   BASOPCT 0 02/08/2013    CMP: Lab Results  Component Value Date   NA 140 02/10/2013   K 3.5 02/11/2013   CL 101 02/10/2013   CO2 30 02/10/2013   BUN 13 02/10/2013   CREATININE 0.74 02/10/2013   PROT 6.5 02/09/2013   ALBUMIN 2.6* 02/09/2013   BILITOT 0.8 02/09/2013   ALKPHOS 118* 02/09/2013   AST 22 02/09/2013   ALT 14 02/09/2013  . Lab  Results  Component Value Date   INR 1.58* 02/11/2013   INR 2.31* 02/09/2013   INR 2.45* 02/08/2013     Discharge Instructions     Follow with Primary MD Daisy Floro, MD in 7 days   Get CBC, CMP, INR checked 2 days by Primary MD or rehabilitation staff and again as instructed by your Primary MD.    Get Medicines reviewed and adjusted.  Please request your Prim.MD to go over all Hospital Tests and Procedure/Radiological results at the follow up, please get all Hospital records sent to your Prim MD by signing hospital release before you go home.  Activity: As tolerated with Full fall precautions use walker/cane & assistance as needed   Diet:  Heart healthy with assistance and Aspiration precautions.  For Heart failure patients - Check your Weight same time everyday, if you gain over 2 pounds, or you develop in leg swelling, experience more shortness of breath or chest pain, call your Primary MD immediately. Follow Cardiac Low Salt Diet and 1.8 lit/day fluid restriction.  Disposition rehabilitation if patient and family are agreeable  If you experience worsening of your admission symptoms, develop shortness of breath, life threatening emergency, suicidal or homicidal thoughts you must seek medical attention immediately by calling 911 or calling your MD immediately  if symptoms less severe.  You Must read complete instructions/literature along with all the possible adverse reactions/side effects for all the Medicines you take and that have been prescribed to you. Take any new Medicines after you have completely understood and accpet all the possible adverse reactions/side effects.   Do not drive and provide baby sitting services if your were admitted for syncope or siezures until you have seen by Primary MD or a Neurologist and advised to do so again.  Do not drive when  taking Pain medications.    Do not take more than prescribed Pain, Sleep and Anxiety Medications  Special  Instructions: If you have smoked or chewed Tobacco  in the last 2 yrs please stop smoking, stop any regular Alcohol  and or any Recreational drug use.  Wear Seat belts while driving.   Please note  You were cared for by a hospitalist during your hospital stay. If you have any questions about your discharge medications or the care you received while you were in the hospital after you are discharged, you can call the unit and asked to speak with the hospitalist on call if the hospitalist that took care of you is not available. Once you are discharged, your primary care physician will handle any further medical issues. Please note that NO REFILLS for any discharge medications will be authorized once you are discharged, as it is imperative that you return to your primary care physician (or establish a relationship with a primary care physician if you do not have one) for your aftercare needs so that they can reassess your need for medications and monitor your lab values.   Follow-up Information   Follow up with Daisy Floro, MD. Schedule an appointment as soon as possible for a visit in 1 week.   Contact information:   [address] [place] [address] [phone]       Follow up with HARDING,DAVID W, MD. Schedule an appointment as soon as possible for a visit in 1 week.   Contact information:   [address] [place] [address] [phone]       Follow up with Thornton Papas, MD. Schedule an appointment as soon as possible for a visit in 1 week.   Contact information:   [address] [place] [address] [phone]         Discharge Medications     Medication List         atorvastatin 40 MG tablet  Commonly known as:  LIPITOR  Take 40 mg by mouth daily.     carvedilol 3.125 MG tablet  Commonly known as:  COREG  Take 3.125 mg by mouth 2 (two) times daily with a meal.     citalopram 20 MG tablet  Commonly known as:  CELEXA  Take 20  mg by mouth daily.     cyclobenzaprine 5 MG tablet  Commonly known as:  FLEXERIL  Take 5 mg by mouth every 8 (eight) hours as needed for muscle spasms.     diltiazem 180 MG 24 hr capsule  Commonly known as:  DILACOR XR  Take 180 mg by mouth daily.     furosemide 40 MG tablet  Commonly known as:  LASIX  Take 1 tablet (40 mg total) by mouth 2 (two) times daily.     HYDROcodone-acetaminophen 5-325 MG per tablet  Commonly known as:  NORCO/VICODIN  Take 1 tablet by mouth every 4 (four) hours as needed for pain.     levothyroxine 112 MCG tablet  Commonly known as:  SYNTHROID, LEVOTHROID  Take 112 mcg by mouth daily before breakfast.     MULTIVITAMIN PO  Take 1 tablet by mouth daily.     potassium chloride SA 20 MEQ tablet  Commonly known as:  K-DUR,KLOR-CON  Take 1 tablet (20 mEq total) by mouth daily.     warfarin 4 MG tablet  Commonly known as:  COUMADIN  Take 4 mg by mouth daily.     zolpidem  5 MG tablet  Commonly known as:  AMBIEN  Take 5 mg by mouth at bedtime as needed for sleep.           Total Time in preparing paper work, data evaluation and todays exam - 35 minutes  Leroy Sea M.D on 02/11/2013 at 8:51 AM  [facility] Office  [phone]

## 2013-02-11 NOTE — Progress Notes (Signed)
IP PROGRESS NOTE  Subjective:  She reports feeling better after the thoracentesis. She had a bowel movement yesterday.   Objective: Vital signs in last 24 hours: Blood pressure 115/68, pulse 86, temperature 97.4 F (36.3 C), temperature source Oral, resp. rate 18, height 5\' 3"  (1.6 m), weight 144 lb 6.4 oz (65.5 kg), SpO2 96.00%.  Intake/Output from previous day: 07/15 0701 - 07/16 0700 In: 240 [P.O.:240] Out: 1900 [Urine:900]  Physical Exam:  HEENT: Neck without mass Lungs: Decreased breath sounds at the lower chest bilaterally. Inspiratory rub at the left upper anterior chest. Cardiac: Irregular Extremities: Trace low leg edema bilaterally Breasts: Status post left mastectomy. No evidence for chest wall tumor recurrence.  Lab Results:  Recent Labs  02/09/13 0330 02/10/13 0545  WBC 6.3 7.4  HGB 10.2* 12.0  HCT 32.0* 36.4  PLT 271 298    BMET  Recent Labs  02/09/13 0330 02/10/13 0545  NA 140 140  K 3.8 3.8  CL 104 101  CO2 27 30  GLUCOSE 108* 94  BUN 13 13  CREATININE 0.73 0.74  CALCIUM 8.6 8.7    Studies/Results: Dg Chest 1 View  02/09/2013   *RADIOLOGY REPORT*  Clinical Data: Left pleural effusion, status post thoracentesis.  CHEST - 1 VIEW  Comparison: 02/08/2013  Findings: Significant reduction in size of left pleural fluid collection.  Residual density at the left lung apex and left lung base.  Underlying cardiomegaly noted.  Compared to yesterday, there is increased interstitial and patchy airspace opacity favoring edema.  Nodularity in the right mid lung persists.  IMPRESSION: 1.  Reduced size left pleural fluid collection, without pneumothorax. 2.  Compared to yesterday, there is increased interstitial and patchy airspace edema. 3.  Nodularity in the right midlung- malignancy is not excluded. 4.  Mild cardiomegaly.   Original Report Authenticated By: Gaylyn Rong, M.D.   Dg Chest 2 View  02/08/2013   *RADIOLOGY REPORT*  Clinical Data: Shortness of  breath, wheezing, history pleural effusion, breast cancer, atrial fibrillation, hypertension, CHF  CHEST - 2 VIEW  Comparison: 07/01/2012 Correlation:  CT chest 07/28/2012  Findings: Post left mastectomy and axillary node dissection. Enlargement of cardiac silhouette with pulmonary vascular congestion. Moderate to large left pleural effusion with increased atelectasis versus consolidation in left lower lobe. Increased opacity at left lung apex corresponding to the left upper lobe scarring. Additionally, abnormal density is identified at the AP window and left hilum, corresponding to course of the elevated left pulmonary artery on the prior CT.  Underlying emphysematous changes. Streaky opacity at the right base appears to represent a combination of atelectasis and question nodularity as seen on the most recent CT. Right apical scarring stable. Bones diffusely demineralized. No pneumothorax. Atherosclerotic calcification aorta.  IMPRESSION: Moderate to large left pleural effusion with increased atelectasis versus consolidation in lower left lung. Underlying tumor in the lower left chest is not excluded; clinically indicated consider follow-up CT chest with contrast. Underlying COPD with biapical scarring greater on the left. Scattered chronic interstitial changes are identified with persistent opacities at the right base question representing combination of atelectasis and nodular foci which were seen on the prior CT chest.   Original Report Authenticated By: Ulyses Southward, M.D.   US Thoracentesis Asp Pleural Space W/img Guide  02/09/2013   *RADIOLOGY REPORT*  Clinical Data:  Bilateral pleural effusions; left larger than right  ULTRASOUND GUIDED left THORACENTESIS  Comparison:  None  An ultrasound guided thoracentesis was thoroughly discussed with the patient and questions  answered.  The benefits, risks, alternatives and complications were also discussed.  The patient understands and wishes to proceed with the  procedure.  Written consent was obtained.  Ultrasound was performed to localize and mark an adequate pocket of fluid in the left chest.  The area was then prepped and draped in the normal sterile fashion.  1% Lidocaine was used for local anesthesia.  Under ultrasound guidance a 19 gauge Yueh catheter was introduced.  Thoracentesis was performed.  The catheter was removed and a dressing applied.  Complications:  None  Findings: A total of approximately 1 liter of yellow fluid was removed. A fluid sample was sent for laboratory analysis.  IMPRESSION: Successful ultrasound guided left thoracentesis yielding 1 liter of pleural fluid.  Pt started coughing; needed to stop procedure.  Read by: Ralene Muskrat, P.A.-C   Original Report Authenticated By: Tacey Ruiz, MD    Medications: I have reviewed the patient's current medications.  Assessment/Plan: 1. Stage III-A left-sided breast cancer diagnosed in February of 2000. She was maintained on Femara from February 2005 through November 2008.       2. History of chronic parenchymal lung densities and a borderline elevated CA27.29. Multiple CT scans have been negative for evidence of disease progression. She last underwent a CT of the chest in December of 2013.      3.  left pleural effusion-pleural fluid cytology negative while in [place] in June 2014, status post a diagnostic/therapeutic thoracentesis 02/09/2013-pleural fluid cytology pending      4.  Atrial fibrillation       5.  Status post a fall with a left hip fracture June 2014  I will followup on the pleural fluid cytology. I will schedule a CT of the chest to followup on the parenchymal lung densities. We will arrange for outpatient followup at the cancer Center in approximately one month. Please call oncology as needed in the interim.    LOS: 2 days   Laymond Postle, Jillyn Hidden  02/10/2013, 9:41 AM

## 2013-02-11 NOTE — Progress Notes (Signed)
IV d/c at this time; pt to d/c to SNF today; will cont. To monitor.

## 2013-02-11 NOTE — Progress Notes (Signed)
ANTICOAGULATION CONSULT NOTE - Initial Consult  Pharmacy Consult for Coumadin Indication: history of  atrial fibrillation  No Known Allergies  Patient Measurements: Height: 5\' 3"  (160 cm) Weight: 142 lb 3.2 oz (64.5 kg) IBW/kg (Calculated) : 52.4   Vital Signs: Temp: 97.5 F (36.4 C) (07/17 0445) Temp src: Oral (07/17 0445) BP: 107/64 mmHg (07/17 0445) Pulse Rate: 86 (07/17 0445)  Labs:  Recent Labs  02/08/13 1400 02/08/13 2054 02/09/13 0330 02/09/13 0825 02/10/13 0545 02/11/13 0540  HGB 11.2*  --  10.2*  --  12.0  --   HCT 36.1  --  32.0*  --  36.4  --   PLT 303  --  271  --  298  --   LABPROT 25.8*  --  24.6*  --   --  18.4*  INR 2.45*  --  2.31*  --   --  1.58*  CREATININE 0.90  --  0.73  --  0.74  --   TROPONINI <0.30 <0.30 <0.30 <0.30  --   --     Estimated Creatinine Clearance: 45.6 ml/min (by C-G formula based on Cr of 0.74).   Medical History: Past Medical History  Diagnosis Date  . Pleural effusion   . Hypertension   . CHF (congestive heart failure)   . Atrial fibrillation   . Hypothyroid   . Breast cancer   . Lung nodule     Medications:  Prescriptions prior to admission  Medication Sig Dispense Refill  . atorvastatin (LIPITOR) 40 MG tablet Take 40 mg by mouth daily.      . carvedilol (COREG) 3.125 MG tablet Take 3.125 mg by mouth 2 (two) times daily with a meal.      . citalopram (CELEXA) 20 MG tablet Take 20 mg by mouth daily.       . cyclobenzaprine (FLEXERIL) 5 MG tablet Take 5 mg by mouth every 8 (eight) hours as needed for muscle spasms.      Marland Kitchen diltiazem (DILACOR XR) 180 MG 24 hr capsule Take 180 mg by mouth daily.      Marland Kitchen levothyroxine (SYNTHROID, LEVOTHROID) 112 MCG tablet Take 112 mcg by mouth daily before breakfast.      . Multiple Vitamins-Minerals (MULTIVITAMIN PO) Take 1 tablet by mouth daily.      Marland Kitchen warfarin (COUMADIN) 4 MG tablet Take 4 mg by mouth daily.      Marland Kitchen zolpidem (AMBIEN) 5 MG tablet Take 5 mg by mouth at bedtime as  needed for sleep.      . [DISCONTINUED] furosemide (LASIX) 20 MG tablet Take 20 mg by mouth daily.      . [DISCONTINUED] HYDROcodone-acetaminophen (NORCO/VICODIN) 5-325 MG per tablet Take 1 tablet by mouth every 4 (four) hours as needed for pain.       Scheduled:  . atorvastatin  40 mg Oral q1800  . carvedilol  3.125 mg Oral BID WC  . citalopram  20 mg Oral Daily  . diltiazem  180 mg Oral Daily  . furosemide  40 mg Oral BID  . levothyroxine  112 mcg Oral QAC breakfast  . potassium chloride  20 mEq Oral Daily  . sodium chloride  3 mL Intravenous Q12H  . Warfarin - Pharmacist Dosing Inpatient   Does not apply q1800    Assessment: 79 y.o female with history of atrial fibrillation.  On chronic coumadin 4 mg daily prior to admission.  Admitted on 02/08/13 and coumadin held for a diagnostic/therapeuitc thoracentesis.  Thoracentesis done 02/09/13.  Coumadin resumed 02/10/13. INR 1.58 today after off coumadin x 2 days.  CBC on 7/16 was H/H 12.0/36.4, PLTC 298K stable. No bleeding noted.   Goal of Therapy:  INR 2-3 Monitor platelets by anticoagulation protocol: Yes   Plan:  Dr. Thedore Mins is discharging this patient today and has resumed her prior to admission Coumadin 4mg  daily.  Protime/INR monitoring per primary care MD.    Noah Delaine, RPh Clinical Pharmacist Pager: [phone] 02/11/2013,10:50 AM

## 2013-02-11 NOTE — Progress Notes (Addendum)
Clinical Social Worker facilitated patient discharge by contacting the patient, family and facility, [facility]. Patient and family agreeable to this plan and arranging transport via EMS . CSW will sign off, as social work intervention is no longer needed. Blue Medicare auth received for SNF and EMS- [id number]  Maree Krabbe, MSW, [place] [phone]

## 2013-02-13 LAB — BODY FLUID CULTURE: Culture: NO GROWTH

## 2013-03-04 ENCOUNTER — Ambulatory Visit (INDEPENDENT_AMBULATORY_CARE_PROVIDER_SITE_OTHER): Payer: Medicare Other | Admitting: Cardiovascular Disease

## 2013-03-04 ENCOUNTER — Encounter: Payer: Self-pay | Admitting: Cardiovascular Disease

## 2013-03-04 VITALS — BP 116/72 | HR 76 | Ht 64.0 in | Wt 139.0 lb

## 2013-03-04 DIAGNOSIS — E785 Hyperlipidemia, unspecified: Secondary | ICD-10-CM

## 2013-03-04 DIAGNOSIS — I10 Essential (primary) hypertension: Secondary | ICD-10-CM

## 2013-03-04 DIAGNOSIS — I482 Chronic atrial fibrillation, unspecified: Secondary | ICD-10-CM

## 2013-03-04 DIAGNOSIS — I4891 Unspecified atrial fibrillation: Secondary | ICD-10-CM

## 2013-03-04 NOTE — Assessment & Plan Note (Signed)
On statin therapy 

## 2013-03-04 NOTE — Progress Notes (Signed)
03/04/2013 Jackelyn Poling [facility]   1925/07/22  [id number]  Primary Physician Daisy Floro, MD Primary Cardiologist: [name] MD Roseanne Reno   HPI:  The patient is an 79 year old thin and frail appearing widowed Caucasian female, mother of 3, grandmother to 5 grandchildren who is accompanied by one of her daughters today. I saw her in the office approximately 5 weeks ago. She has a history of hypertension, hyperlipidemia, and chronic atrial fibrillation, rate controlled on Coumadin anticoagulation which we follow her at our Coumadin clinic. When I saw her in early December she was complaining of decreased exercise tolerance with increasing dyspnea on exertion with some chest pressure, new compared to her prior visit. Her weight was also up 10 pounds, and she had new pitting edema on her lower extremities. I doubled her Lasix which resulted in improvement in her edema and shortness of breath. A 2D echo showed interval mild decrease in LV function with an EF in the 45-50% range compared to greater than 55% back in 2010. A Myoview stress test was normal. Lab work was normal as well with a BNP of 192. A chest x-ray did show right lower lobe nodules, and a followup CT scan confirmed this with an interval development of a 1.5 x 1.2 cm nodule in the right lower lobe which the reading radiologist, Dr. Patrina Levering, read as suspicious for possible primary bronchogenic neoplasm, though apparently she has had nodules on CTs in the past as well. He also mentioned atherosclerosis in the LAD and RCA. Since I last saw her 6 months ago she has broken her hip and her hip replacement. She is currently finishing time spent at at a nursing home/rehabilitation facility is scheduled to be released home next week. She has had some pleural effusions but otherwise denies chest pain.      Current Outpatient Prescriptions  Medication Sig Dispense Refill  . atorvastatin (LIPITOR) 40 MG tablet Take 40  mg by mouth daily.      . carvedilol (COREG) 3.125 MG tablet Take 3.125 mg by mouth 2 (two) times daily with a meal.      . citalopram (CELEXA) 20 MG tablet Take 20 mg by mouth daily.       . cyclobenzaprine (FLEXERIL) 5 MG tablet Take 5 mg by mouth every 8 (eight) hours as needed for muscle spasms.      Marland Kitchen diltiazem (DILACOR XR) 180 MG 24 hr capsule Take 180 mg by mouth daily.      . furosemide (LASIX) 40 MG tablet Take 1 tablet (40 mg total) by mouth 2 (two) times daily.  20 tablet  0  . HYDROcodone-acetaminophen (NORCO/VICODIN) 5-325 MG per tablet Take 1 tablet by mouth every 4 (four) hours as needed for pain.  10 tablet  0  . levothyroxine (SYNTHROID, LEVOTHROID) 112 MCG tablet Take 112 mcg by mouth daily before breakfast.      . Multiple Vitamins-Minerals (MULTIVITAMIN PO) Take 1 tablet by mouth daily.      . potassium chloride SA (K-DUR,KLOR-CON) 20 MEQ tablet Take 1 tablet (20 mEq total) by mouth daily.  20 tablet  0  . warfarin (COUMADIN) 4 MG tablet Take 4 mg by mouth daily.      Marland Kitchen zolpidem (AMBIEN) 5 MG tablet Take 5 mg by mouth at bedtime as needed for sleep.       No current facility-administered medications for this visit.    No Known Allergies  History   Social History  . Marital Status:  Married    Spouse Name: N/A    Number of Children: N/A  . Years of Education: N/A   Occupational History  . Not on file.   Social History Main Topics  . Smoking status: Never Smoker   . Smokeless tobacco: Not on file  . Alcohol Use: No  . Drug Use: No  . Sexually Active: No   Other Topics Concern  . Not on file   Social History Narrative  . No narrative on file     Review of Systems: General: negative for chills, fever, night sweats or weight changes.  Cardiovascular: negative for chest pain, dyspnea on exertion, edema, orthopnea, palpitations, paroxysmal nocturnal dyspnea or shortness of breath Dermatological: negative for rash Respiratory: negative for cough or  wheezing Urologic: negative for hematuria Abdominal: negative for nausea, vomiting, diarrhea, bright red blood per rectum, melena, or hematemesis Neurologic: negative for visual changes, syncope, or dizziness All other systems reviewed and are otherwise negative except as noted above.    Blood pressure 116/72, pulse 76, height 5\' 4"  (1.626 m), weight 139 lb (63.05 kg).  General appearance: alert and no distress Neck: no adenopathy, no carotid bruit, no JVD, supple, symmetrical, trachea midline and thyroid not enlarged, symmetric, no tenderness/mass/nodules Lungs: clear to auscultation bilaterally Heart: irregularly irregular rhythm Extremities: extremities normal, atraumatic, no cyanosis or edema  EKG not performed today  ASSESSMENT AND PLAN:   Chronic atrial fibrillation Rate controlled on Coumadin anticoagulation followed here in our clinic  Hypertension Well-controlled on current medications  Hyperlipidemia On statin therapy      [name] MD [facility], [facility] 03/04/2013 12:54 PM

## 2013-03-04 NOTE — Assessment & Plan Note (Signed)
Rate controlled on Coumadin anticoagulation followed here in our clinic 

## 2013-03-04 NOTE — Assessment & Plan Note (Signed)
Well-controlled on current medications 

## 2013-03-04 NOTE — Patient Instructions (Addendum)
Your physician wants you to follow-up in: 6 months with Wilburt Finlay PA and 12 months with Dr Allyson Sabal. You will receive a reminder letter in the mail two months in advance. If you don't receive a letter, please call our office to schedule the follow-up appointment.

## 2013-03-05 ENCOUNTER — Ambulatory Visit (HOSPITAL_BASED_OUTPATIENT_CLINIC_OR_DEPARTMENT_OTHER): Payer: Medicare Other | Admitting: Oncology

## 2013-03-05 VITALS — BP 126/74 | HR 67 | Temp 97.5°F | Resp 18 | Ht 64.0 in | Wt 139.5 lb

## 2013-03-05 DIAGNOSIS — C50919 Malignant neoplasm of unspecified site of unspecified female breast: Secondary | ICD-10-CM

## 2013-03-05 NOTE — Progress Notes (Signed)
   [facility]    OFFICE PROGRESS NOTE   INTERVAL HISTORY:   She returns as scheduled. I saw her in the hospital last month she was admitted with dyspnea and a large pleural effusion. Pleural fluid cytology returned negative. A CT of the chest on 02/11/2013 revealed no change in a right lower lobe pulmonary nodule compared to December of 2013 with a slight increase in a precarinal lymph node.  She reports feeling well. She is returning home from the skilled nursing facility today. Her daughter reports she has memory loss.  Objective:  Vital signs in last 24 hours:  Blood pressure 126/74, pulse 67, temperature 97.5 F (36.4 C), temperature source Oral, resp. rate 18, height 5\' 4"  (1.626 m), weight 139 lb 8 oz (63.277 kg), SpO2 100.00%.    HEENT: Neck without mass Lymphatics: No cervical, supraclavicular, or left axillary nodes. 1/2 cm mobile right axillary node Resp: Decreased breath sounds at the left lower chest within and inspiratory rub at the left posterior chest, good air movement bilaterally, no respiratory distress Cardio: Irregular GI: No hepatomegaly Vascular: No leg edema  Breast: Status post left mastectomy. No evidence for chest wall tumor recurrence. Right breast without mass.     Medications: I have reviewed the patient's current medications.  Assessment/Plan: 1. Stage III-A left-sided breast cancer diagnosed in February of 2000. She was maintained on Femara from February 2005 through November 2008. 2. History of chronic parenchymal lung densities and a borderline elevated CA27.29. Multiple CT scans have been negative for evidence of disease progression. She last underwent a CT of the chest on 02/11/2013 3. left pleural effusion-pleural fluid cytology negative while in [place] in June 2014, status post a diagnostic/therapeutic thoracentesis 02/09/2013-negative for malignancy 4. Atrial fibrillation  5. Status post a fall with a left hip fracture June  2014  6. Memory loss  Disposition:  Ms. Bayless remains in clinical remission from breast cancer. I suspect the pleural fluid is related to heart failure. She has a chronic history of parenchymal lung densities-most likely benign. We decided against further CT scans for now. She will return for an office visit in 4 months. We will see her in the interim as needed.   Thornton Papas, MD  03/05/2013  9:36 AM

## 2013-03-09 ENCOUNTER — Telehealth: Payer: Self-pay | Admitting: *Deleted

## 2013-03-09 ENCOUNTER — Telehealth: Payer: Self-pay | Admitting: Cardiovascular Disease

## 2013-03-09 ENCOUNTER — Telehealth: Payer: Self-pay | Admitting: Oncology

## 2013-03-09 LAB — POCT INR: INR: 3

## 2013-03-09 NOTE — Telephone Encounter (Signed)
Diane called reporting today's PT/INR = 36.1/ 3.0.  Patient taking 4 mg coumadin daily.  Diane then found that this lab order camed from a provider at a nursing home and should go to the patient's cardiologist.

## 2013-03-09 NOTE — Telephone Encounter (Signed)
s.w. pt and advised on 12.5.14 appt....pt ok and aware

## 2013-03-09 NOTE — Telephone Encounter (Signed)
PT 36.1 INR 3.0  4mg  daily

## 2013-03-11 ENCOUNTER — Ambulatory Visit (INDEPENDENT_AMBULATORY_CARE_PROVIDER_SITE_OTHER): Payer: Self-pay | Admitting: Pharmacist Clinician (PhC)/ Clinical Pharmacy Specialist

## 2013-03-11 DIAGNOSIS — I482 Chronic atrial fibrillation, unspecified: Secondary | ICD-10-CM

## 2013-03-11 DIAGNOSIS — Z7901 Long term (current) use of anticoagulants: Secondary | ICD-10-CM

## 2013-03-11 DIAGNOSIS — I4891 Unspecified atrial fibrillation: Secondary | ICD-10-CM

## 2013-03-11 NOTE — Telephone Encounter (Signed)
rn notified to recheck INR Tuesday

## 2013-03-16 ENCOUNTER — Telehealth: Payer: Self-pay | Admitting: Pharmacist Clinician (PhC)/ Clinical Pharmacy Specialist

## 2013-03-16 ENCOUNTER — Ambulatory Visit (INDEPENDENT_AMBULATORY_CARE_PROVIDER_SITE_OTHER): Payer: Self-pay | Admitting: Pharmacist Clinician (PhC)/ Clinical Pharmacy Specialist

## 2013-03-16 DIAGNOSIS — Z7901 Long term (current) use of anticoagulants: Secondary | ICD-10-CM

## 2013-03-16 DIAGNOSIS — I4891 Unspecified atrial fibrillation: Secondary | ICD-10-CM

## 2013-03-16 DIAGNOSIS — I482 Chronic atrial fibrillation, unspecified: Secondary | ICD-10-CM

## 2013-03-16 LAB — POCT INR: INR: 2.5

## 2013-03-16 NOTE — Telephone Encounter (Signed)
PT 30.4 INR 2.5  Stated pt is taking 4 mg of Coumadin daily.  Informed pharmacist will be notified and will contact pt.  Verbalized understanding.  Message forwarded to K. Alvstad, PharmD.

## 2013-03-22 ENCOUNTER — Ambulatory Visit: Payer: Medicare Other | Admitting: Oncology

## 2013-03-22 ENCOUNTER — Telehealth: Payer: Self-pay | Admitting: Cardiovascular Disease

## 2013-03-22 ENCOUNTER — Telehealth: Payer: Self-pay | Admitting: Oncology

## 2013-03-22 NOTE — Telephone Encounter (Signed)
Mrs Leah Guerra has some questions for you about her mother coumadin .Marland Kitchen Please call    Thanks

## 2013-03-22 NOTE — Telephone Encounter (Signed)
Due to last pt 2pm 12/5 moved appt to 12/1. lmonvm for pt re change w/new d/t for 12/1. Mailed schedule

## 2013-03-23 ENCOUNTER — Telehealth: Payer: Self-pay | Admitting: Pharmacist Clinician (PhC)/ Clinical Pharmacy Specialist

## 2013-03-23 ENCOUNTER — Ambulatory Visit (INDEPENDENT_AMBULATORY_CARE_PROVIDER_SITE_OTHER): Payer: Self-pay | Admitting: Pharmacist Clinician (PhC)/ Clinical Pharmacy Specialist

## 2013-03-23 DIAGNOSIS — I4891 Unspecified atrial fibrillation: Secondary | ICD-10-CM

## 2013-03-23 DIAGNOSIS — I482 Chronic atrial fibrillation, unspecified: Secondary | ICD-10-CM

## 2013-03-23 DIAGNOSIS — Z7901 Long term (current) use of anticoagulants: Secondary | ICD-10-CM

## 2013-03-23 LAB — POCT INR: INR: 3.1

## 2013-03-23 MED ORDER — WARFARIN SODIUM 4 MG PO TABS: 4.0000 mg | ORAL_TABLET | Freq: Every day | ORAL | Status: DC

## 2013-03-23 NOTE — Telephone Encounter (Signed)
PT is 3.1 and INR is 37.4-Pt is on 4 mg of Coumadin daily.

## 2013-03-30 ENCOUNTER — Telehealth: Payer: Self-pay | Admitting: Cardiovascular Disease

## 2013-03-30 ENCOUNTER — Ambulatory Visit (INDEPENDENT_AMBULATORY_CARE_PROVIDER_SITE_OTHER): Payer: Self-pay | Admitting: Pharmacist Clinician (PhC)/ Clinical Pharmacy Specialist

## 2013-03-30 DIAGNOSIS — I4891 Unspecified atrial fibrillation: Secondary | ICD-10-CM

## 2013-03-30 DIAGNOSIS — Z7901 Long term (current) use of anticoagulants: Secondary | ICD-10-CM

## 2013-03-30 DIAGNOSIS — I482 Chronic atrial fibrillation, unspecified: Secondary | ICD-10-CM

## 2013-03-30 LAB — POCT INR: INR: 1.9

## 2013-03-30 MED ORDER — DILTIAZEM HCL ER 180 MG PO CP24: 180.0000 mg | ORAL_CAPSULE | Freq: Every day | ORAL | Status: DC

## 2013-03-30 MED ORDER — WARFARIN SODIUM 4 MG PO TABS: 4.0000 mg | ORAL_TABLET | Freq: Every day | ORAL | Status: DC

## 2013-03-30 MED ORDER — POTASSIUM CHLORIDE CRYS ER 20 MEQ PO TBCR: 20.0000 meq | EXTENDED_RELEASE_TABLET | Freq: Every day | ORAL | Status: DC

## 2013-03-30 MED ORDER — ATORVASTATIN CALCIUM 40 MG PO TABS: 40.0000 mg | ORAL_TABLET | Freq: Every day | ORAL | Status: AC

## 2013-03-30 MED ORDER — FUROSEMIDE 40 MG PO TABS: 40.0000 mg | ORAL_TABLET | Freq: Two times a day (BID) | ORAL | Status: DC

## 2013-03-30 MED ORDER — CARVEDILOL 3.125 MG PO TABS: 3.1250 mg | ORAL_TABLET | Freq: Two times a day (BID) | ORAL | Status: DC

## 2013-03-30 NOTE — Telephone Encounter (Signed)
PT is 1.9 and INR is  22.2 she take 4 mg of coumadin daily.

## 2013-03-30 NOTE — Telephone Encounter (Signed)
Forwarded to kristen 

## 2013-03-31 ENCOUNTER — Other Ambulatory Visit: Payer: Self-pay | Admitting: *Deleted

## 2013-03-31 MED ORDER — FUROSEMIDE 40 MG PO TABS: 40.0000 mg | ORAL_TABLET | Freq: Two times a day (BID) | ORAL | Status: DC

## 2013-03-31 MED ORDER — POTASSIUM CHLORIDE CRYS ER 20 MEQ PO TBCR: 20.0000 meq | EXTENDED_RELEASE_TABLET | Freq: Every day | ORAL | Status: DC

## 2013-03-31 NOTE — Telephone Encounter (Signed)
Rx was sent to pharmacy electronically. 

## 2013-04-02 ENCOUNTER — Telehealth: Payer: Self-pay | Admitting: Cardiovascular Disease

## 2013-04-02 NOTE — Telephone Encounter (Signed)
Returned call.  Left message to call back before 4pm.    Will need to contact PCP r/t orders for Speech Therapy.

## 2013-04-02 NOTE — Telephone Encounter (Signed)
Returned call to [employer].  Advised she contact PCP r/t orders for speech therapy.  Verbalized understanding and will contact Dr. Tenny Craw.

## 2013-04-02 NOTE — Telephone Encounter (Signed)
Returning your call. °

## 2013-04-02 NOTE — Telephone Encounter (Signed)
She needs an order to get  Speech therapy for the pt-think this will help with her thought process.

## 2013-04-06 ENCOUNTER — Ambulatory Visit (INDEPENDENT_AMBULATORY_CARE_PROVIDER_SITE_OTHER): Payer: Medicare Other | Admitting: Pharmacist Clinician (PhC)/ Clinical Pharmacy Specialist

## 2013-04-06 ENCOUNTER — Telehealth: Payer: Self-pay | Admitting: Cardiovascular Disease

## 2013-04-06 DIAGNOSIS — I4891 Unspecified atrial fibrillation: Secondary | ICD-10-CM

## 2013-04-06 DIAGNOSIS — I482 Chronic atrial fibrillation, unspecified: Secondary | ICD-10-CM

## 2013-04-06 DIAGNOSIS — Z7901 Long term (current) use of anticoagulants: Secondary | ICD-10-CM

## 2013-04-06 LAB — POCT INR: INR: 1.9

## 2013-04-06 NOTE — Telephone Encounter (Signed)
Calling in PT/INR  PT:1.9 INR 23.2  Pt is also being discharged. Call Leah Guerra daughter to make an appointment or adjustment

## 2013-04-06 NOTE — Telephone Encounter (Signed)
See anticoag

## 2013-05-04 ENCOUNTER — Ambulatory Visit (INDEPENDENT_AMBULATORY_CARE_PROVIDER_SITE_OTHER): Payer: Medicare Other | Admitting: Pharmacist Clinician (PhC)/ Clinical Pharmacy Specialist

## 2013-05-04 VITALS — BP 142/88 | HR 76

## 2013-05-04 DIAGNOSIS — I4891 Unspecified atrial fibrillation: Secondary | ICD-10-CM

## 2013-05-04 DIAGNOSIS — Z7901 Long term (current) use of anticoagulants: Secondary | ICD-10-CM

## 2013-05-04 DIAGNOSIS — I482 Chronic atrial fibrillation, unspecified: Secondary | ICD-10-CM

## 2013-05-04 LAB — POCT INR: INR: 1.8

## 2013-05-17 ENCOUNTER — Ambulatory Visit (INDEPENDENT_AMBULATORY_CARE_PROVIDER_SITE_OTHER): Payer: Medicare Other | Admitting: Physician Assistant

## 2013-05-17 ENCOUNTER — Encounter: Payer: Self-pay | Admitting: Physician Assistant

## 2013-05-17 VITALS — BP 120/70 | HR 73 | Ht 64.0 in | Wt 150.0 lb

## 2013-05-17 DIAGNOSIS — R0602 Shortness of breath: Secondary | ICD-10-CM

## 2013-05-17 NOTE — Progress Notes (Signed)
Date:  05/17/2013   ID:  Leah Guerra, DOB 18-Jul-1925, MRN [id number]  PCP:   Duane Lope, MD  Primary Cardiologist:  Allyson Sabal    History of Present Illness: Leah Guerra is a 79 y.o. female frail appearing widowed Caucasian female, mother of 3, grandmother to 5 grandchildren who is accompanied by one of her daughters today.   Dr. Allyson Sabal saw her in the office approximately on Mar 04, 2013.  She has a history of hypertension, hyperlipidemia, and chronic atrial fibrillation, rate controlled on Coumadin anticoagulation which we follow her at our Coumadin clinic. When he saw her in early December she was complaining of decreased exercise tolerance with increasing dyspnea on exertion with some chest pressure, new compared to her prior visit. Her weight was also up 10 pounds, and she had new pitting edema on her lower extremities. I doubled her Lasix which resulted in improvement in her edema and shortness of breath. A 2D echo showed interval mild decrease in LV function with an EF in the 45-50% range compared to greater than 55% back in 2010. A Myoview stress test was normal. Lab work was normal as well with a BNP of 192. A chest x-ray did show right lower lobe nodules, and a followup CT scan confirmed this with an interval development of a 1.5 x 1.2 cm nodule in the right lower lobe which the reading radiologist, Dr. Patrina Levering, read as suspicious for possible primary bronchogenic neoplasm, though apparently she has had nodules on CTs in the past as well. He also mentioned atherosclerosis in the LAD and RCA.    She presents with SOB for 2-3 weeks which did get better for a short time.  It is mostly with exertion.  She denies orthopnea but does have some mild LEE.  She otherwise feels good.  Her weight is up about 11 pounds but she apparently is eating a lot of ice cream.  In the last 6-9 months she had a left pleural effusion drained twice.   The patient currently denies nausea,  vomiting, fever, chest pain, orthopnea, dizziness, PND, cough, congestion, abdominal pain, hematochezia, melena.  Wt Readings from Last 3 Encounters:  05/17/13 150 lb (68.04 kg)  03/05/13 139 lb 8 oz (63.277 kg)  03/04/13 139 lb (63.05 kg)     Past Medical History  Diagnosis Date  . Pleural effusion   . Hypertension   . CHF (congestive heart failure)   . Atrial fibrillation   . Hypothyroid   . Breast cancer   . Lung nodule   . Hyperlipidemia     Current Outpatient Prescriptions  Medication Sig Dispense Refill  . atorvastatin (LIPITOR) 40 MG tablet Take 1 tablet (40 mg total) by mouth daily.  90 tablet  1  . carvedilol (COREG) 3.125 MG tablet Take 1 tablet (3.125 mg total) by mouth 2 (two) times daily with a meal.  180 tablet  1  . citalopram (CELEXA) 20 MG tablet Take 20 mg by mouth daily.       . furosemide (LASIX) 40 MG tablet Take 1 tablet (40 mg total) by mouth 2 (two) times daily.  180 tablet  1  . HYDROcodone-acetaminophen (NORCO/VICODIN) 5-325 MG per tablet Take 1 tablet by mouth every 4 (four) hours as needed for pain.  10 tablet  0  . levothyroxine (SYNTHROID, LEVOTHROID) 112 MCG tablet Take 112 mcg by mouth daily before breakfast.      . Multiple Vitamins-Minerals (MULTIVITAMIN PO) Take 1 tablet by  mouth daily.      . potassium chloride SA (K-DUR,KLOR-CON) 20 MEQ tablet Take 1 tablet (20 mEq total) by mouth daily.  90 tablet  1  . warfarin (COUMADIN) 4 MG tablet Take 1 tablet (4 mg total) by mouth daily. Coumadin monitoring per Dr. Allyson Sabal  90 tablet  1  . zolpidem (AMBIEN) 5 MG tablet Take 5 mg by mouth at bedtime as needed for sleep.      . cyclobenzaprine (FLEXERIL) 5 MG tablet Take 5 mg by mouth every 8 (eight) hours as needed for muscle spasms.      . TAZTIA XT 180 MG 24 hr capsule Take 180 mg by mouth daily.       No current facility-administered medications for this visit.    Allergies:   No Known Allergies  Social History:  The patient  reports that she has  never smoked. She does not have any smokeless tobacco history on file. She reports that she does not drink alcohol or use illicit drugs.   Family history:  History reviewed. No pertinent family history.  ROS:  Please see the history of present illness.  All other systems reviewed and negative.   PHYSICAL EXAM: VS:  BP 120/70  Pulse 73  Ht 5\' 4"  (1.626 m)  Wt 150 lb (68.04 kg)  BMI 25.73 kg/m2 Well nourished, well developed, in no acute distress HEENT: Pupils are equal round react to light accommodation extraocular movements are intact.  Neck: no JVDNo cervical lymphadenopathy. Cardiac: Irreg rate and rhythm without murmurs rubs or gallops. Lungs:  clear to auscultation bilaterally, no wheezing, rhonchi or rales Abd: soft, mild tenderness in RLQ, positive bowel sounds all quadrants, no hepatosplenomegaly Ext: 1+lower extremity edema.  2+ radial and 1+ dorsalis pedis pulses. Skin: warm and dry Neuro:  Grossly normal  EKG:  Afib 73 BPM.  PVCs.  ASSESSMENT AND PLAN:  Problem List Items Addressed This Visit   SOB (shortness of breath) - Primary     The patient does not appear to be in heart failure given the description of her symptoms and by physical exam.   Her weight is clearly up several pounds but she is eating a lot more according to her daughter.  I do not necessarily think her pleural effusion has returned. I will, however, check a chest x-ray and BNP.  Her O2 sats in the office at rest were 96%.      Relevant Orders      EKG 12-Lead      DG Chest 2 View      B Nat Peptide

## 2013-05-17 NOTE — Assessment & Plan Note (Signed)
The patient does not appear to be in heart failure given the description of her symptoms and by physical exam.   Her weight is clearly up several pounds but she is eating a lot more according to her daughter.  I do not necessarily think her pleural effusion has returned. I will, however, check a chest x-ray and BNP.  Her O2 sats in the office at rest were 96%.

## 2013-05-17 NOTE — Patient Instructions (Signed)
We will schedule a chest xray and have the lab draw a BNP to see if you have extra fluid.  Follow up in one month.

## 2013-05-18 ENCOUNTER — Encounter: Payer: Self-pay | Admitting: Physician Assistant

## 2013-05-18 ENCOUNTER — Ambulatory Visit
Admission: RE | Admit: 2013-05-18 | Discharge: 2013-05-18 | Disposition: A | Discharge status: Home / Self Care | Payer: Medicare Other | Source: Ambulatory Visit | Attending: Physician Assistant | Admitting: Physician Assistant

## 2013-05-18 DIAGNOSIS — R0602 Shortness of breath: Secondary | ICD-10-CM

## 2013-05-25 ENCOUNTER — Ambulatory Visit (INDEPENDENT_AMBULATORY_CARE_PROVIDER_SITE_OTHER): Payer: Medicare Other | Admitting: Pharmacist Clinician (PhC)/ Clinical Pharmacy Specialist

## 2013-05-25 VITALS — BP 130/78 | HR 88

## 2013-05-25 DIAGNOSIS — I482 Chronic atrial fibrillation, unspecified: Secondary | ICD-10-CM

## 2013-05-25 DIAGNOSIS — I4891 Unspecified atrial fibrillation: Secondary | ICD-10-CM

## 2013-05-25 DIAGNOSIS — Z7901 Long term (current) use of anticoagulants: Secondary | ICD-10-CM

## 2013-05-28 ENCOUNTER — Telehealth: Payer: Self-pay | Admitting: Physician Assistant

## 2013-05-28 NOTE — Telephone Encounter (Signed)
Left VM for emergency contact, daughter Harriett Sine, regarding CXR & labs and informed that Dr. Rockie Neighbours, RN would be notified and they will be contacted with results as soon as possible.

## 2013-05-28 NOTE — Telephone Encounter (Signed)
Please call-had xray and blood work about 2 weeks ago,would like the results.

## 2013-06-07 NOTE — Telephone Encounter (Signed)
Patient notified on 11/6 of results

## 2013-06-17 ENCOUNTER — Ambulatory Visit (INDEPENDENT_AMBULATORY_CARE_PROVIDER_SITE_OTHER): Payer: Medicare Other | Admitting: Physician Assistant

## 2013-06-17 ENCOUNTER — Encounter: Payer: Self-pay | Admitting: Physician Assistant

## 2013-06-17 ENCOUNTER — Ambulatory Visit (INDEPENDENT_AMBULATORY_CARE_PROVIDER_SITE_OTHER): Payer: Medicare Other | Admitting: Pharmacist Clinician (PhC)/ Clinical Pharmacy Specialist

## 2013-06-17 VITALS — BP 120/80 | HR 72 | Ht 63.0 in | Wt 150.1 lb

## 2013-06-17 DIAGNOSIS — I4891 Unspecified atrial fibrillation: Secondary | ICD-10-CM

## 2013-06-17 DIAGNOSIS — I10 Essential (primary) hypertension: Secondary | ICD-10-CM

## 2013-06-17 DIAGNOSIS — R0602 Shortness of breath: Secondary | ICD-10-CM

## 2013-06-17 DIAGNOSIS — I482 Chronic atrial fibrillation, unspecified: Secondary | ICD-10-CM

## 2013-06-17 DIAGNOSIS — Z7901 Long term (current) use of anticoagulants: Secondary | ICD-10-CM

## 2013-06-17 NOTE — Assessment & Plan Note (Addendum)
Rate well-controlled. On Coumadin.  INR today is 1.8. Belenda Cruise will be adjusting Coumadin

## 2013-06-17 NOTE — Progress Notes (Signed)
Date:  06/17/2013   ID:  Leah Guerra, DOB [birth year], MRN [id number]  PCP:   Duane Lope, MD  Primary Cardiologist:  Allyson Sabal     History of Present Illness: Leah Guerra is a 79 y.o. female  frail appearing widowed Caucasian female, mother of 3, grandmother to 5 grandchildren who is accompanied by one of her daughters today. Dr. Allyson Sabal saw her in the office approximately on Mar 04, 2013. She has a history of hypertension, hyperlipidemia, and chronic atrial fibrillation, rate controlled on Coumadin anticoagulation which we follow her at our Coumadin clinic. When he saw her in early December she was complaining of decreased exercise tolerance with increasing dyspnea on exertion with some chest pressure, new compared to her prior visit. Her weight was also up 10 pounds, and she had new pitting edema on her lower extremities. I doubled her Lasix which resulted in improvement in her edema and shortness of breath. A 2D echo showed interval mild decrease in LV function with an EF in the 45-50% range compared to greater than 55% back in 2010. A Myoview stress test was normal. Lab work was normal as well with a BNP of 192. A chest x-ray did show right lower lobe nodules, and a followup CT scan confirmed this with an interval development of a 1.5 x 1.2 cm nodule in the right lower lobe which the reading radiologist, Dr. Patrina Levering, read as suspicious for possible primary bronchogenic neoplasm, though apparently she has had nodules on CTs in the past as well. He also mentioned atherosclerosis in the LAD and RCA.   When I saw the patient last evening complaining of shortness of breath for about 2-3 weeks and was concerned that her pleural effusions had returned. Chest x-ray showed a small pleural effusion bilaterally with mild CHF. BNP was mildly elevated at 301.6.  She presents today in followup and appears to be doing quite well shortness of breath has improved she has no complaints other  than some mild lower extremity edema.  The patient currently denies nausea, vomiting, fever, chest pain, shortness of breath, orthopnea, dizziness, PND, cough, congestion, abdominal pain, hematochezia, melena, claudication.  Wt Readings from Last 3 Encounters:  06/17/13 150 lb 1.6 oz (68.085 kg)  05/17/13 150 lb (68.04 kg)  03/05/13 139 lb 8 oz (63.277 kg)     Past Medical History  Diagnosis Date  . Pleural effusion   . Hypertension   . CHF (congestive heart failure)   . Atrial fibrillation   . Hypothyroid   . Breast cancer   . Lung nodule   . Hyperlipidemia     Current Outpatient Prescriptions  Medication Sig Dispense Refill  . atorvastatin (LIPITOR) 40 MG tablet Take 1 tablet (40 mg total) by mouth daily.  90 tablet  1  . carvedilol (COREG) 3.125 MG tablet Take 1 tablet (3.125 mg total) by mouth 2 (two) times daily with a meal.  180 tablet  1  . citalopram (CELEXA) 20 MG tablet Take 20 mg by mouth daily.       . cyclobenzaprine (FLEXERIL) 5 MG tablet Take 5 mg by mouth every 8 (eight) hours as needed for muscle spasms.      . furosemide (LASIX) 40 MG tablet Take 1 tablet (40 mg total) by mouth 2 (two) times daily.  180 tablet  1  . HYDROcodone-acetaminophen (NORCO/VICODIN) 5-325 MG per tablet Take 1 tablet by mouth every 4 (four) hours as needed for pain.  10 tablet  0  . levothyroxine (SYNTHROID, LEVOTHROID) 112 MCG tablet Take 112 mcg by mouth daily before breakfast.      . Multiple Vitamins-Minerals (MULTIVITAMIN PO) Take 1 tablet by mouth daily.      . potassium chloride SA (K-DUR,KLOR-CON) 20 MEQ tablet Take 1 tablet (20 mEq total) by mouth daily.  90 tablet  1  . TAZTIA XT 180 MG 24 hr capsule Take 180 mg by mouth daily.      Marland Kitchen warfarin (COUMADIN) 4 MG tablet Take 1 tablet (4 mg total) by mouth daily. Coumadin monitoring per Dr. Allyson Sabal  90 tablet  1  . zolpidem (AMBIEN) 5 MG tablet Take 5 mg by mouth at bedtime as needed for sleep.       No current facility-administered  medications for this visit.    Allergies:   No Known Allergies  Social History:  The patient  reports that she has never smoked. She does not have any smokeless tobacco history on file. She reports that she does not drink alcohol or use illicit drugs.   Family history:  History reviewed. No pertinent family history.  ROS:  Please see the history of present illness.  All other systems reviewed and negative.   PHYSICAL EXAM: VS:  BP 120/80  Pulse 72  Ht 5\' 3"  (1.6 m)  Wt 150 lb 1.6 oz (68.085 kg)  BMI 26.60 kg/m2 Well nourished, well developed, in no acute distress HEENT: Pupils are equal round react to light accommodation extraocular movements are intact.  Neck: no JVDNo cervical lymphadenopathy. Cardiac: Irregular rate and rhythm without murmurs rubs or gallops. Lungs:  clear to auscultation bilaterally, no wheezing, rhonchi or rales Ext: 1+ lower extremity edema.  2+ radial and dorsalis pedis pulses. Skin: warm and dry Neuro:  Grossly normal       ASSESSMENT AND PLAN:  Problem List Items Addressed This Visit   SOB (shortness of breath)     Overall shortness of breath has improved. Bilateral pleural effusions on chest x-ray were small. She'll followup in approximately 6 months.    Hypertension     Pressure well controlled on current medications.    Chronic atrial fibrillation - Primary (Chronic)     Rate well-controlled. On Coumadin.  INR today is 1.8. Belenda Cruise will be adjusting Coumadin

## 2013-06-17 NOTE — Assessment & Plan Note (Signed)
Overall shortness of breath has improved. Bilateral pleural effusions on chest x-ray were small. She'll followup in approximately 6 months.

## 2013-06-17 NOTE — Assessment & Plan Note (Signed)
Pressure well controlled on current medications.

## 2013-06-17 NOTE — Patient Instructions (Addendum)
Continue to weight daily.. Elevate legs and wear your compression socks as much as possible during the day.  Take advantage of time to do exercises while in a chair.  Follow up with Dr. Allyson Sabal in 6 Months

## 2013-06-18 ENCOUNTER — Ambulatory Visit: Payer: Medicare Other | Admitting: Physician Assistant

## 2013-06-21 ENCOUNTER — Ambulatory Visit: Payer: Medicare Other | Admitting: Physician Assistant

## 2013-06-23 ENCOUNTER — Ambulatory Visit: Payer: Medicare Other | Admitting: Pharmacist Clinician (PhC)/ Clinical Pharmacy Specialist

## 2013-06-28 ENCOUNTER — Ambulatory Visit: Payer: Medicare Other | Admitting: Pharmacist Clinician (PhC)/ Clinical Pharmacy Specialist

## 2013-06-28 ENCOUNTER — Ambulatory Visit (INDEPENDENT_AMBULATORY_CARE_PROVIDER_SITE_OTHER): Payer: Medicare Other | Admitting: Pharmacist Clinician (PhC)/ Clinical Pharmacy Specialist

## 2013-06-28 ENCOUNTER — Ambulatory Visit (HOSPITAL_BASED_OUTPATIENT_CLINIC_OR_DEPARTMENT_OTHER): Payer: Medicare Other | Admitting: Oncology

## 2013-06-28 VITALS — BP 136/84 | HR 68

## 2013-06-28 VITALS — BP 125/72 | HR 114 | Temp 97.8°F | Resp 20 | Ht 63.0 in | Wt 149.3 lb

## 2013-06-28 DIAGNOSIS — R413 Other amnesia: Secondary | ICD-10-CM

## 2013-06-28 DIAGNOSIS — I4891 Unspecified atrial fibrillation: Secondary | ICD-10-CM

## 2013-06-28 DIAGNOSIS — I482 Chronic atrial fibrillation, unspecified: Secondary | ICD-10-CM

## 2013-06-28 DIAGNOSIS — Z853 Personal history of malignant neoplasm of breast: Secondary | ICD-10-CM

## 2013-06-28 DIAGNOSIS — R911 Solitary pulmonary nodule: Secondary | ICD-10-CM

## 2013-06-28 DIAGNOSIS — Z7901 Long term (current) use of anticoagulants: Secondary | ICD-10-CM

## 2013-06-28 NOTE — Progress Notes (Signed)
   [facility]    OFFICE PROGRESS NOTE   INTERVAL HISTORY:   Ms. Ibsen returns for scheduled followup of breast cancer. She reports improvement in dyspnea. Good appetite. Occasional dysphagia. She is living alone.  Objective:  Vital signs in last 24 hours:  Blood pressure 125/72, pulse 114, temperature 97.8 F (36.6 C), temperature source Oral, resp. rate 20, height 5\' 3"  (1.6 m), weight 149 lb 4.8 oz (67.722 kg).    HEENT: Neck without mass Lymphatics: No cervical, supraclavicular, or axillary node Resp: Decreased breath sounds with end inspiratory rhonchi at the left posterior base, end expiratory wheeze at the right upper anterior chest, no respiratory distress Cardio: Irregular GI: No hepatomegaly Vascular: Trace low leg edema bilaterally Breasts: Status post left mastectomy, no evidence for chest wall tumor recurrence. Right breast without mass.   X-rays: Chest x-ray 05/18/2013-small pleural effusions, cardiomegaly, and minimal pulmonary vascular congestion.  Medications: I have reviewed the patient's current medications.  Assessment/Plan: 1. Stage III-A left-sided breast cancer diagnosed in February of 2000. She was maintained on Femara from February 2005 through November 2008. 2. History of chronic parenchymal lung densities and a borderline elevated CA27.29. Multiple CT scans have been negative for evidence of disease progression. She last underwent a CT of the chest on 02/11/2013 , a stable right lung nodule was noted. 3. left pleural effusion-pleural fluid cytology negative while in [place] in June 2014, status post a diagnostic/therapeutic thoracentesis 02/09/2013-negative for malignancy  4. Atrial fibrillation  5. Status post a fall with a left hip fracture June 2014  6. Memory loss    Disposition:  She remains in clinical remission from breast cancer. She will return for an office visit in 8 months. We will contact her to discuss reimaging  of the right lung nodule within the next 3-4 months.   Thornton Papas, MD  06/28/2013  4:19 PM

## 2013-06-29 ENCOUNTER — Telehealth: Payer: Self-pay | Admitting: *Deleted

## 2013-06-29 ENCOUNTER — Telehealth: Payer: Self-pay | Admitting: Oncology

## 2013-06-29 DIAGNOSIS — R911 Solitary pulmonary nodule: Secondary | ICD-10-CM

## 2013-06-29 NOTE — Telephone Encounter (Signed)
Tried to contact the pt by her telephone number and vm stated pt phone is out of service. Mailed the pt her aug 2015 appt calendar.

## 2013-06-29 NOTE — Telephone Encounter (Signed)
Dr. Truett Perna recommends non-contrast CT in 4 mos to follow up R lung nodule. Spoke with pt's daughter Harriett Sine. She is in agreement, understands to expect call from schedulers with appt for CT.

## 2013-07-02 ENCOUNTER — Ambulatory Visit: Payer: Medicare Other | Admitting: Oncology

## 2013-07-26 ENCOUNTER — Ambulatory Visit (INDEPENDENT_AMBULATORY_CARE_PROVIDER_SITE_OTHER): Payer: Medicare Other | Admitting: Pharmacist Clinician (PhC)/ Clinical Pharmacy Specialist

## 2013-07-26 VITALS — BP 122/68 | HR 72

## 2013-07-26 DIAGNOSIS — I482 Chronic atrial fibrillation, unspecified: Secondary | ICD-10-CM

## 2013-07-26 DIAGNOSIS — I4891 Unspecified atrial fibrillation: Secondary | ICD-10-CM

## 2013-07-26 DIAGNOSIS — Z7901 Long term (current) use of anticoagulants: Secondary | ICD-10-CM

## 2013-08-24 ENCOUNTER — Ambulatory Visit: Payer: Medicare Other | Admitting: Pharmacist Clinician (PhC)/ Clinical Pharmacy Specialist

## 2013-08-25 ENCOUNTER — Ambulatory Visit (INDEPENDENT_AMBULATORY_CARE_PROVIDER_SITE_OTHER): Payer: Medicare Other | Admitting: Pharmacist Clinician (PhC)/ Clinical Pharmacy Specialist

## 2013-08-25 VITALS — BP 104/80 | HR 84

## 2013-08-25 DIAGNOSIS — Z7901 Long term (current) use of anticoagulants: Secondary | ICD-10-CM

## 2013-08-25 DIAGNOSIS — I4891 Unspecified atrial fibrillation: Secondary | ICD-10-CM

## 2013-08-25 DIAGNOSIS — I482 Chronic atrial fibrillation, unspecified: Secondary | ICD-10-CM

## 2013-08-25 LAB — POCT INR: INR: 6.6

## 2013-09-02 ENCOUNTER — Ambulatory Visit: Payer: Medicare Other | Admitting: Pharmacist Clinician (PhC)/ Clinical Pharmacy Specialist

## 2013-09-06 ENCOUNTER — Ambulatory Visit (INDEPENDENT_AMBULATORY_CARE_PROVIDER_SITE_OTHER): Payer: Medicare Other | Admitting: Physician Assistant

## 2013-09-06 ENCOUNTER — Encounter: Payer: Self-pay | Admitting: Physician Assistant

## 2013-09-06 ENCOUNTER — Ambulatory Visit (INDEPENDENT_AMBULATORY_CARE_PROVIDER_SITE_OTHER): Payer: Medicare Other | Admitting: Pharmacist Clinician (PhC)/ Clinical Pharmacy Specialist

## 2013-09-06 VITALS — BP 128/86 | HR 65 | Ht 63.0 in | Wt 149.1 lb

## 2013-09-06 DIAGNOSIS — I482 Chronic atrial fibrillation, unspecified: Secondary | ICD-10-CM

## 2013-09-06 DIAGNOSIS — I4891 Unspecified atrial fibrillation: Secondary | ICD-10-CM

## 2013-09-06 DIAGNOSIS — R609 Edema, unspecified: Secondary | ICD-10-CM

## 2013-09-06 DIAGNOSIS — E785 Hyperlipidemia, unspecified: Secondary | ICD-10-CM

## 2013-09-06 DIAGNOSIS — Z7901 Long term (current) use of anticoagulants: Secondary | ICD-10-CM

## 2013-09-06 DIAGNOSIS — R600 Localized edema: Secondary | ICD-10-CM | POA: Insufficient documentation

## 2013-09-06 DIAGNOSIS — I5032 Chronic diastolic (congestive) heart failure: Secondary | ICD-10-CM

## 2013-09-06 DIAGNOSIS — I10 Essential (primary) hypertension: Secondary | ICD-10-CM

## 2013-09-06 LAB — POCT INR: INR: 2

## 2013-09-06 NOTE — Assessment & Plan Note (Signed)
She has a lot of difficulty putting on compression socks that she currently has. We'll try  one size higher to see if that makes it more easy as long as she still is getting some compression.

## 2013-09-06 NOTE — Assessment & Plan Note (Signed)
Currently on Lipitor. 

## 2013-09-06 NOTE — Assessment & Plan Note (Signed)
Currently seems well compensated no signs of acute heart failure. She is on Lasix 40 mg twice daily

## 2013-09-06 NOTE — Progress Notes (Signed)
Date:  09/06/2013   ID:  Leah Guerra, DOB 15-Sep-1924, MRN [id number]  PCP:   Melinda Crutch, MD  Primary Cardiologist:  Gwenlyn Found    History of Present Illness: Leah Guerra is a 80 y.o. female frail appearing widowed Caucasian female, mother of 88, grandmother to 5 grandchildren who is accompanied by one of her daughters today. Dr. Gwenlyn Found saw her in the office approximately on Mar 04, 2013. She has a history of hypertension, hyperlipidemia, and chronic atrial fibrillation, rate controlled on Coumadin anticoagulation which we follow her at our Coumadin clinic. When he saw her in early December she was complaining of decreased exercise tolerance with increasing dyspnea on exertion with some chest pressure, new compared to her prior visit. Her weight was also up 10 pounds, and she had new pitting edema on her lower extremities. I doubled her Lasix which resulted in improvement in her edema and shortness of breath. A 2D echo showed interval mild decrease in LV function with an EF in the 45-50% range compared to greater than 55% back in 2010. A Myoview stress test was normal. Lab work was normal as well with a BNP of 192. A chest x-ray did show right lower lobe nodules, and a followup CT scan confirmed this with an interval development of a 1.5 x 1.2 cm nodule in the right lower lobe which the reading radiologist, Dr. Madie Reno, read as suspicious for possible primary bronchogenic neoplasm, though apparently she has had nodules on CTs in the past as well. He also mentioned atherosclerosis in the LAD and RCA.   Patient presents today for 6 month evaluation. She has no complaints at this time other than some lower extremity edema which is chronic for her. She does have difficulty getting on the compression socks. Underweight is very stable 149 pounds.    The patient currently denies nausea, vomiting, fever, chest pain, shortness of breath, orthopnea, dizziness, PND, cough, congestion,  abdominal pain, hematochezia, melena, lower extremity edema, claudication.  Wt Readings from Last 3 Encounters:  09/06/13 149 lb 1.6 oz (67.631 kg)  06/28/13 149 lb 4.8 oz (67.722 kg)  06/17/13 150 lb 1.6 oz (68.085 kg)     Past Medical History  Diagnosis Date  . Pleural effusion   . Hypertension   . CHF (congestive heart failure)   . Atrial fibrillation   . Hypothyroid   . Breast cancer   . Lung nodule   . Hyperlipidemia     Current Outpatient Prescriptions  Medication Sig Dispense Refill  . atorvastatin (LIPITOR) 40 MG tablet Take 1 tablet (40 mg total) by mouth daily.  90 tablet  1  . carvedilol (COREG) 3.125 MG tablet Take 1 tablet (3.125 mg total) by mouth 2 (two) times daily with a meal.  180 tablet  1  . citalopram (CELEXA) 20 MG tablet Take 20 mg by mouth daily.       . cyclobenzaprine (FLEXERIL) 5 MG tablet Take 5 mg by mouth every 8 (eight) hours as needed for muscle spasms.      . furosemide (LASIX) 40 MG tablet Take 1 tablet (40 mg total) by mouth 2 (two) times daily.  180 tablet  1  . HYDROcodone-acetaminophen (NORCO/VICODIN) 5-325 MG per tablet Take 1 tablet by mouth every 4 (four) hours as needed for pain.  10 tablet  0  . levothyroxine (SYNTHROID, LEVOTHROID) 112 MCG tablet Take 112 mcg by mouth daily before breakfast.      . potassium chloride SA (  K-DUR,KLOR-CON) 20 MEQ tablet Take 1 tablet (20 mEq total) by mouth daily.  90 tablet  1  . TAZTIA XT 180 MG 24 hr capsule Take 180 mg by mouth daily.      Marland Kitchen warfarin (COUMADIN) 4 MG tablet Take 1 tablet (4 mg total) by mouth daily. Coumadin monitoring per Dr. Gwenlyn Found  90 tablet  1  . zolpidem (AMBIEN) 5 MG tablet Take 5 mg by mouth at bedtime as needed for sleep.       No current facility-administered medications for this visit.    Allergies:   No Known Allergies  Social History:  The patient  reports that she has never smoked. She does not have any smokeless tobacco history on file. She reports that she does not drink  alcohol or use illicit drugs.   Family history:  No family history on file.  ROS:  Please see the history of present illness.  All other systems reviewed and negative.   PHYSICAL EXAM: VS:  BP 128/86  Pulse 65  Ht 5\' 3"  (1.6 m)  Wt 149 lb 1.6 oz (67.631 kg)  BMI 26.42 kg/m2 Well nourished, well developed, in no acute distress HEENT: Pupils are equal round react to light accommodation extraocular movements are intact.  Neck: no JVDNo cervical lymphadenopathy. Cardiac: Irregular rate and rhythm without murmurs rubs or gallops. Lungs:  clear to auscultation bilaterally, no wheezing, rhonchi or rales Abd: soft, nontender, positive bowel sounds all quadrants Ext: 2+ right lower extremity edema. Trace on the left 2+ radial and 1+ dorsalis pedis pulses. Skin: warm and dry Neuro:  Grossly normal  EKG:  Atrial fibrillation with a well-controlled rate 65 beats per minute    ASSESSMENT AND PLAN:  Problem List Items Addressed This Visit   Chronic atrial fibrillation (Chronic)     Rate well controlled on carvedilol and Taztia XT 180 mg daily    Hypertension     Blood pressure well controlled at this time.    Hyperlipidemia     Currently on Lipitor    Lower extremity edema, chronic     She has a lot of difficulty putting on compression socks that she currently has. We'll try  one size higher to see if that makes it more easy as long as she still is getting some compression.    Chronic diastolic heart failure     Currently seems well compensated no signs of acute heart failure. She is on Lasix 40 mg twice daily     Other Visit Diagnoses   Atrial fibrillation    -  Primary    Relevant Orders       EKG 12-Lead

## 2013-09-06 NOTE — Patient Instructions (Signed)
1.  Compression socks.  Try one size larger.

## 2013-09-06 NOTE — Assessment & Plan Note (Signed)
Blood pressure well controlled at this time. 

## 2013-09-06 NOTE — Assessment & Plan Note (Signed)
Rate well controlled on carvedilol and Taztia XT 180 mg daily

## 2013-09-15 ENCOUNTER — Telehealth: Payer: Self-pay | Admitting: Cardiovascular Disease

## 2013-09-15 NOTE — Telephone Encounter (Signed)
Please call-question about her Klor-Con.Pt can not swallow this pill.

## 2013-09-15 NOTE — Telephone Encounter (Signed)
Returned call. Patient having difficulty swallowing potassium. Advised daughter on way to dissolve potassium and mix with easily swallowed substance, applesauce/pudding. Stated she will try that.

## 2013-09-21 ENCOUNTER — Ambulatory Visit (INDEPENDENT_AMBULATORY_CARE_PROVIDER_SITE_OTHER): Payer: Medicare Other | Admitting: Pharmacist Clinician (PhC)/ Clinical Pharmacy Specialist

## 2013-09-21 VITALS — BP 124/70 | HR 68

## 2013-09-21 DIAGNOSIS — I4891 Unspecified atrial fibrillation: Secondary | ICD-10-CM

## 2013-09-21 DIAGNOSIS — Z7901 Long term (current) use of anticoagulants: Secondary | ICD-10-CM

## 2013-09-21 DIAGNOSIS — I482 Chronic atrial fibrillation, unspecified: Secondary | ICD-10-CM

## 2013-09-21 LAB — POCT INR: INR: 2.8

## 2013-09-25 ENCOUNTER — Other Ambulatory Visit: Payer: Self-pay | Admitting: Pharmacist Clinician (PhC)/ Clinical Pharmacy Specialist

## 2013-10-20 ENCOUNTER — Ambulatory Visit (INDEPENDENT_AMBULATORY_CARE_PROVIDER_SITE_OTHER): Payer: Medicare Other | Admitting: Pharmacist Clinician (PhC)/ Clinical Pharmacy Specialist

## 2013-10-20 VITALS — BP 132/84 | HR 76

## 2013-10-20 DIAGNOSIS — Z7901 Long term (current) use of anticoagulants: Secondary | ICD-10-CM

## 2013-10-20 DIAGNOSIS — I4891 Unspecified atrial fibrillation: Secondary | ICD-10-CM

## 2013-10-20 DIAGNOSIS — I482 Chronic atrial fibrillation, unspecified: Secondary | ICD-10-CM

## 2013-10-20 LAB — POCT INR: INR: 3.7

## 2013-10-28 ENCOUNTER — Ambulatory Visit (HOSPITAL_COMMUNITY)
Admission: RE | Admit: 2013-10-28 | Discharge: 2013-10-28 | Disposition: A | Discharge status: Home / Self Care | Payer: Medicare Other | Source: Ambulatory Visit | Attending: Oncology | Admitting: Oncology

## 2013-10-28 DIAGNOSIS — R599 Enlarged lymph nodes, unspecified: Secondary | ICD-10-CM | POA: Insufficient documentation

## 2013-10-28 DIAGNOSIS — J90 Pleural effusion, not elsewhere classified: Secondary | ICD-10-CM | POA: Insufficient documentation

## 2013-10-28 DIAGNOSIS — Z9010 Acquired absence of unspecified breast and nipple: Secondary | ICD-10-CM | POA: Insufficient documentation

## 2013-10-28 DIAGNOSIS — Z923 Personal history of irradiation: Secondary | ICD-10-CM | POA: Insufficient documentation

## 2013-10-28 DIAGNOSIS — Z853 Personal history of malignant neoplasm of breast: Secondary | ICD-10-CM | POA: Insufficient documentation

## 2013-10-28 DIAGNOSIS — I517 Cardiomegaly: Secondary | ICD-10-CM | POA: Insufficient documentation

## 2013-10-28 DIAGNOSIS — R911 Solitary pulmonary nodule: Secondary | ICD-10-CM | POA: Insufficient documentation

## 2013-10-29 ENCOUNTER — Telehealth: Payer: Self-pay | Admitting: *Deleted

## 2013-10-29 NOTE — Telephone Encounter (Signed)
Message copied by Norma Fredrickson on Fri Oct 29, 2013 10:52 AM ------      Message from: Tania Ade      Created: Fri Oct 29, 2013 10:24 AM                   ----- Message -----         From: Ladell Pier, MD         Sent: 10/28/2013   7:30 PM           To: Tania Ade, RN, Ludwig Lean, RN, #            Please call patient, rt. Lung nodule is slightly larger, no other finding to suggest cancer, schedule next 3-4 weeks sherrill or lisa to review CT and decide on further evaluation            Why is she scheduled to see Magrinat in August? ------

## 2013-10-29 NOTE — Telephone Encounter (Signed)
Called and informed patient of CT results, right lung nodule is slightly larger, no other findings to suggest cancer.  Also, informed patient that she will be scheduled within the next 3-4 weeks to see Dr. [name] or Elby Showers.  Thomas.  Per Dr. [name].  Patient verbalized understanding.

## 2013-11-02 ENCOUNTER — Telehealth: Payer: Self-pay | Admitting: Oncology

## 2013-11-02 NOTE — Telephone Encounter (Signed)
lmonvm for pt re appt for 4/24. also changed time for 8/20 appt and moved to BS schedule - BS pt. schedules mailed.

## 2013-11-08 ENCOUNTER — Telehealth: Payer: Self-pay | Admitting: Oncology

## 2013-11-08 NOTE — Telephone Encounter (Signed)
pr called and r/s appt from am to pm 4/24

## 2013-11-12 ENCOUNTER — Ambulatory Visit: Payer: Medicare Other | Admitting: Pharmacist Clinician (PhC)/ Clinical Pharmacy Specialist

## 2013-11-16 ENCOUNTER — Telehealth: Payer: Self-pay | Admitting: *Deleted

## 2013-11-16 ENCOUNTER — Telehealth: Payer: Self-pay | Admitting: Oncology

## 2013-11-16 NOTE — Telephone Encounter (Signed)
Left message for patient to call back regarding schedule on Friday.  Her appointment with Ned Card will need to be moved from 04/24 to 05/01.  Per Elby Showers. Thomas.

## 2013-11-16 NOTE — Telephone Encounter (Signed)
lmonvm advising the pt of her April appt that has been r/s to  11/26/2013.

## 2013-11-17 ENCOUNTER — Telehealth: Payer: Self-pay | Admitting: *Deleted

## 2013-11-17 NOTE — Telephone Encounter (Signed)
Called and informed patient's daughter [name]) of schedule change.  Per Elby Showers. Thomas.  Patient's daughter verbalized understanding.

## 2013-11-17 NOTE — Telephone Encounter (Signed)
Message from pt's daughter returning call about schedule. Returned call, no answer. Left message informing of schedule change as per phone note on 4/21.

## 2013-11-19 ENCOUNTER — Ambulatory Visit: Payer: Medicare Other | Admitting: Pharmacist Clinician (PhC)/ Clinical Pharmacy Specialist

## 2013-11-19 ENCOUNTER — Ambulatory Visit: Payer: Medicare Other | Admitting: Nurse Practitioner

## 2013-11-26 ENCOUNTER — Ambulatory Visit (HOSPITAL_BASED_OUTPATIENT_CLINIC_OR_DEPARTMENT_OTHER): Payer: Medicare Other | Admitting: Nurse Practitioner

## 2013-11-26 ENCOUNTER — Telehealth: Payer: Self-pay | Admitting: Oncology

## 2013-11-26 ENCOUNTER — Ambulatory Visit (INDEPENDENT_AMBULATORY_CARE_PROVIDER_SITE_OTHER): Payer: Medicare Other | Admitting: Pharmacist Clinician (PhC)/ Clinical Pharmacy Specialist

## 2013-11-26 VITALS — BP 130/72 | HR 110 | Temp 97.0°F | Resp 18 | Ht 63.0 in | Wt 152.0 lb

## 2013-11-26 DIAGNOSIS — R911 Solitary pulmonary nodule: Secondary | ICD-10-CM

## 2013-11-26 DIAGNOSIS — I482 Chronic atrial fibrillation, unspecified: Secondary | ICD-10-CM

## 2013-11-26 DIAGNOSIS — Z853 Personal history of malignant neoplasm of breast: Secondary | ICD-10-CM

## 2013-11-26 DIAGNOSIS — I4891 Unspecified atrial fibrillation: Secondary | ICD-10-CM

## 2013-11-26 DIAGNOSIS — Z7901 Long term (current) use of anticoagulants: Secondary | ICD-10-CM

## 2013-11-26 LAB — POCT INR: INR: 2.2

## 2013-11-26 NOTE — Progress Notes (Addendum)
  [place] OFFICE PROGRESS NOTE   Diagnosis:  Breast cancer.  INTERVAL HISTORY:   Leah Guerra returns for discussion regarding the recent chest CT.   The chest CT done 10/28/2013 showed an enlarging spiculated right lower lobe pulmonary nodule measuring 1.5 x 2.1 cm as compared to 12 x 15 mm on the prior study. Mild mediastinal adenopathy was overall unchanged. Mild patchy subpleural opacities in the right middle and lower lobes which were new and felt to likely be infectious/inflammatory. There were small bilateral pleural effusions left greater than right that were unchanged.  She has mild stable dyspnea on exertion. She has an occasional cough. No fever. She has a good appetite. She reports her weight is stable. No changes over the chest wall.  Objective:  Vital signs in last 24 hours:  Blood pressure 130/72, pulse 110, temperature 97 F (36.1 C), temperature source Oral, resp. rate 18, height 5\' 3"  (1.6 m), weight 152 lb (68.947 kg), SpO2 97.00%.    HEENT: No thrush or ulcerations. Lymphatics: No palpable cervical, supraclavicular or axillary lymph nodes. Resp: Lungs clear. Cardio: Irregular. GI: Soft and nontender. No hepatomegaly. Vascular: Trace lower leg edema bilaterally with chronic venous stasis change. Breasts: Status post left mastectomy. No evidence for chest wall tumor recurrence. No mass palpated in the right breast.     Lab Results:  Lab Results  Component Value Date   WBC 7.4 02/10/2013   HGB 12.0 02/10/2013   HCT 36.4 02/10/2013   MCV 89.0 02/10/2013   PLT 298 02/10/2013   NEUTROABS 5.5 02/08/2013    Imaging:  No results found.  Medications: I have reviewed the patient's current medications.  Assessment/Plan: 1. Stage III-A left-sided breast cancer diagnosed in February of 2000. She was maintained on Femara from February 2005 through November 2008. 2. History of chronic parenchymal lung densities and a borderline elevated CA27.29.  Multiple CT scans have been negative for evidence of disease progression.   CT of the chest on 02/11/2013 showed a stable right lung nodule.  CT chest 10/28/2013 showed an enlarging spiculated right lower lobe pulmonary nodule; stable mild mediastinal lymphadenopathy; new mild patchy subpleural opacities in the right middle and lower lobes felt likely to be infectious/inflammatory; small bilateral pleural effusions which were overall stable. 3. Left pleural effusion-pleural fluid cytology negative while in [place] in June 2014, status post a diagnostic/therapeutic thoracentesis 02/09/2013-negative for malignancy. 4. Atrial fibrillation. 5. Status post a fall with a left hip fracture June 2014. 6. Memory loss    Disposition: Dr. [name] reviewed the recent chest CT with Leah Guerra and her son. They understand the lung nodule may be an area of scarring, infection, cancer. They do not want to proceed with a biopsy at this time. They are most comfortable with repeating a chest CT at a four-month interval.  She will return for a followup visit after the CT scan to review the results. She will contact the office in the interim with any problems.  Patient seen with Dr. [name].    Owens Shark ANP/GNP-BC   11/26/2013  3:57 PM  This was a shared visit with Ned Card. We discussed the chest CT findings with Leah Guerra and her son. She does not wish to undergo a PET scan or biopsy. She agrees to a followup CT in 3-4 months.  Julieanne Manson, M.D.

## 2013-11-26 NOTE — Telephone Encounter (Signed)
Gave pt appt for MD on July 2015

## 2013-11-28 ENCOUNTER — Other Ambulatory Visit: Payer: Self-pay | Admitting: Cardiovascular Disease

## 2013-11-30 NOTE — Telephone Encounter (Signed)
Rx was sent to pharmacy electronically. 

## 2013-12-24 ENCOUNTER — Ambulatory Visit: Payer: Medicare Other | Admitting: Pharmacist Clinician (PhC)/ Clinical Pharmacy Specialist

## 2013-12-27 ENCOUNTER — Ambulatory Visit (INDEPENDENT_AMBULATORY_CARE_PROVIDER_SITE_OTHER): Payer: Medicare Other | Admitting: Pharmacist Clinician (PhC)/ Clinical Pharmacy Specialist

## 2013-12-27 DIAGNOSIS — Z7901 Long term (current) use of anticoagulants: Secondary | ICD-10-CM

## 2013-12-27 DIAGNOSIS — I4891 Unspecified atrial fibrillation: Secondary | ICD-10-CM

## 2013-12-27 DIAGNOSIS — I482 Chronic atrial fibrillation, unspecified: Secondary | ICD-10-CM

## 2013-12-27 LAB — POCT INR: INR: 2.4

## 2014-01-08 ENCOUNTER — Telehealth: Payer: Self-pay | Admitting: Oncology

## 2014-01-08 NOTE — Telephone Encounter (Signed)
PAL - moved 7/31 appt to 7/28. s/w pt she is aware.

## 2014-01-24 ENCOUNTER — Ambulatory Visit (INDEPENDENT_AMBULATORY_CARE_PROVIDER_SITE_OTHER): Payer: Medicare Other | Admitting: Pharmacist Clinician (PhC)/ Clinical Pharmacy Specialist

## 2014-01-24 DIAGNOSIS — Z7901 Long term (current) use of anticoagulants: Secondary | ICD-10-CM

## 2014-01-24 DIAGNOSIS — I482 Chronic atrial fibrillation, unspecified: Secondary | ICD-10-CM

## 2014-01-24 DIAGNOSIS — I4891 Unspecified atrial fibrillation: Secondary | ICD-10-CM

## 2014-01-24 LAB — POCT INR: INR: 2.6

## 2014-02-09 ENCOUNTER — Telehealth: Payer: Self-pay | Admitting: Oncology

## 2014-02-09 NOTE — Telephone Encounter (Signed)
Pt's daughter cancelled due to pt has another apt r/s w/NP due to Dr. [name] had no availability, will mailed updated scheduled.......KJ

## 2014-02-21 ENCOUNTER — Ambulatory Visit: Payer: Medicare Other | Admitting: Pharmacist Clinician (PhC)/ Clinical Pharmacy Specialist

## 2014-02-22 ENCOUNTER — Ambulatory Visit: Payer: Medicare Other | Admitting: Oncology

## 2014-02-25 ENCOUNTER — Ambulatory Visit: Payer: Medicare Other | Admitting: Oncology

## 2014-02-28 ENCOUNTER — Ambulatory Visit (HOSPITAL_COMMUNITY)
Admission: RE | Admit: 2014-02-28 | Discharge: 2014-02-28 | Disposition: A | Discharge status: Home / Self Care | Payer: Medicare Other | Source: Ambulatory Visit | Attending: Nurse Practitioner | Admitting: Nurse Practitioner

## 2014-02-28 ENCOUNTER — Ambulatory Visit (INDEPENDENT_AMBULATORY_CARE_PROVIDER_SITE_OTHER): Payer: Medicare Other | Admitting: Pharmacist Clinician (PhC)/ Clinical Pharmacy Specialist

## 2014-02-28 DIAGNOSIS — R911 Solitary pulmonary nodule: Secondary | ICD-10-CM | POA: Insufficient documentation

## 2014-02-28 DIAGNOSIS — Z7901 Long term (current) use of anticoagulants: Secondary | ICD-10-CM

## 2014-02-28 DIAGNOSIS — I4891 Unspecified atrial fibrillation: Secondary | ICD-10-CM

## 2014-02-28 DIAGNOSIS — R0602 Shortness of breath: Secondary | ICD-10-CM | POA: Insufficient documentation

## 2014-02-28 DIAGNOSIS — Z853 Personal history of malignant neoplasm of breast: Secondary | ICD-10-CM | POA: Insufficient documentation

## 2014-02-28 DIAGNOSIS — I482 Chronic atrial fibrillation, unspecified: Secondary | ICD-10-CM

## 2014-02-28 LAB — POCT INR: INR: 4.4

## 2014-03-01 ENCOUNTER — Ambulatory Visit (HOSPITAL_BASED_OUTPATIENT_CLINIC_OR_DEPARTMENT_OTHER): Payer: Medicare Other | Admitting: Nurse Practitioner

## 2014-03-01 ENCOUNTER — Telehealth: Payer: Self-pay | Admitting: Oncology

## 2014-03-01 DIAGNOSIS — R911 Solitary pulmonary nodule: Secondary | ICD-10-CM

## 2014-03-01 DIAGNOSIS — Z853 Personal history of malignant neoplasm of breast: Secondary | ICD-10-CM

## 2014-03-01 NOTE — Telephone Encounter (Signed)
per 2nd 8/4 pof cx pulmonary referral and schedule appt w/dr. gerhardt. s/w cindy @ [facility] re appt - cindy aware of referral and will s/w dr gerhardt and call pt. s/w holly @ [place] [facility] to cx appt w/dr Chase Caller 8/27. s/w dtr nancy she is aware of changes and to expect a call from [facility]. number in EPIC updated to reflect nancy. alos s/w pt but she was not able to hear completed why i was calling.

## 2014-03-01 NOTE — Progress Notes (Addendum)
[place] OFFICE PROGRESS NOTE   Diagnosis:  Breast cancer.  INTERVAL HISTORY:   Leah Guerra returns as scheduled. She reports feeling well. She denies shortness of breath. She had some wheezing yesterday following exertion. No cough or fever. She denies pain. She has a good appetite.  Objective:  Vital signs in last 24 hours:  There were no vitals taken for this visit.    HEENT: No thrush or ulceration. Lymphatics: No palpable cervical, supraclavicular or axillary lymph nodes. Resp: Lungs clear. Cardio: Regular cardiac rhythm. GI: Abdomen soft and nontender. No hepatomegaly. Vascular: Firm edema at the lower legs bilaterally. Bilateral chronic venous stasis changes.  Breast: Status post left mastectomy. No evidence for chest wall tumor recurrence.   Lab Results:  Lab Results  Component Value Date   WBC 7.4 02/10/2013   HGB 12.0 02/10/2013   HCT 36.4 02/10/2013   MCV 89.0 02/10/2013   PLT 298 02/10/2013   NEUTROABS 5.5 02/08/2013    Imaging:  Ct Chest Wo Contrast  02/28/2014   CLINICAL DATA:  History breast cancer diagnosed in 1998. Status post left mastectomy and radiation therapy, now complete. Followup evaluation for lung nodules. Shortness of breath.  EXAM: CT CHEST WITHOUT CONTRAST  TECHNIQUE: Multidetector CT imaging of the chest was performed following the standard protocol without IV contrast.  COMPARISON:  Chest CT 10/28/2013.  FINDINGS: Mediastinum: Heart size is mildly enlarged. There is no significant pericardial fluid, thickening or pericardial calcification. There is atherosclerosis of the thoracic aorta, the great vessels of the mediastinum and the coronary arteries, including calcified atherosclerotic plaque in the left main, left anterior descending and right coronary arteries. Interval enlargement of a lower right paratracheal lymph node which currently measures 1.7 cm in short axis. Enlargement of subcarinal lymph node which currently  measures 1.5 cm in short axis. Mildly enlarged distal paraesophageal lymph node measuring 11 mm in short axis. Accurate assessment for hilar lymphadenopathy is not possible on today's non contrast CT examination. Esophagus is unremarkable in appearance.  Lungs/Pleura: Compared to the prior examination the irregular macrolobulated nodule with spiculated margins in the right lower lobe has increased in size, currently measuring approximately 2.4 x 2.2 cm (image 40 of series 5). This lesion has slowly progressively enlarged since 07/28/2012. Since that time there has been a significant interval increase in size and number of what are now innumerable additional perilymphatic micro and macronodules throughout the right lung, the overall appearance of which is concerning for progressively worsening lymphangitic spread of disease. Strictly speaking, and unusual appearance of an atypical infectious process could also account for these findings. Multifocal bronchial wall thickening throughout the lungs bilaterally, most severe in the right middle lobe where there is also some mild cylindrical and varicose bronchiectasis and some chronic architectural distortion, compatible with chronic post infectious or inflammatory scarring. Small bilateral pleural effusions (stable on the right and decreased slightly on the left compared to the prior examination). Extensive bilateral apical (left greater than right) architectural distortion and bronchiectasis, most compatible with chronic post infectious or inflammatory scarring (some of the findings in the left apex may relate to prior radiation therapy). Stable subpleural reticulation in the anterior aspect of the left lung, presumably related to prior radiation therapy.  Upper Abdomen: Unremarkable.  Musculoskeletal: Status post left modified radical mastectomy and axillary nodal dissection. No soft tissue mass along the left chest wall or left axillary region to suggest local  recurrence of disease. There are no aggressive appearing lytic or blastic  lesions noted in the visualized portions of the skeleton.  IMPRESSION: 1. Progressively worsening findings in the right lower lobe which remain concerning for progression of malignancy with dominant 2.4 x 2.2 cm right lower lobe nodule, and evidence suggestive of worsening lymphangitic spread of tumor in the right lower lobe. There is also progressively worsening mediastinal adenopathy, as above. An unusual appearing atypical infectious process could also account for these findings, however, this is not favored. Correlation with biopsy may provide additional diagnostic information if clinically appropriate. 2. Slight decrease in small left pleural effusion. Small right pleural effusion is unchanged. 3. Additional incidental findings, as above, similar prior examinations.   Electronically Signed   By: Vinnie Langton M.D.   On: 02/28/2014 13:17    Medications: I have reviewed the patient's current medications.  Assessment/Plan: 1. Stage III-A left-sided breast cancer diagnosed in February of 2000. She was maintained on Femara from February 2005 through November 2008. 2. History of chronic parenchymal lung densities and a borderline elevated CA27.29. Multiple CT scans have been negative for evidence of disease progression.  CT of the chest on 02/11/2013 showed a stable right lung nodule.  CT chest 10/28/2013 showed an enlarging spiculated right lower lobe pulmonary nodule; stable mild mediastinal lymphadenopathy; new mild patchy subpleural opacities in the right middle and lower lobes felt likely to be infectious/inflammatory; small bilateral pleural effusions which were overall stable. CT chest 02/28/2014 with increase in the size of the right lower lobe nodule; interval increase in size and number of perilymphatic micro-and macro nodules throughout the right lung; worsening of mediastinal adenopathy; slight decrease in small left  pleural effusion; small right pleural effusion unchanged. 3. Left pleural effusion-pleural fluid cytology negative while in [place] in June 2014, status post a diagnostic/therapeutic thoracentesis 02/09/2013-negative for malignancy. 4. Atrial fibrillation. 5. Status post a fall with a left hip fracture June 2014. 6. Memory loss  Disposition: Leah Guerra's clinical status appears unchanged. The recent chest CT shows progressive changes, question malignancy, question infectious process. Dr. [name] reviewed the CT result with Leah Guerra and her daughter and recommends proceeding with a biopsy. We will make a referral to Dr. Servando Snare for consideration of a bronchoscopy/EBUS. She will return for a followup visit in 4-6 weeks. She or her daughter will contact the office in the interim with any problems.  Patient seen with Dr. [name]. 25 minutes were spent face-to-face at today's visit with the majority of that time involved in counseling/coordination of care.    Ned Card ANP/GNP-BC   03/01/2014  1:41 PM  This is a shared visit with Ned Card. I discussed the CT images with Leah Guerra and her daughter.I reviewed the images. She has a chronic history of intermittent nodular lung lesions. It is unclear whether these lesions represent recurrent breast cancer, an atypical infection, a benign inflammatory process, or another malignancy. We will refer her to Dr. Servando Snare to consider the indication for a bronchoscopy/EBUS. She is against a percutaneous biopsy given her history of a chest tube following a biopsy in the past.  Julieanne Manson, M.D.

## 2014-03-01 NOTE — Telephone Encounter (Signed)
gv and rpinted appt sched for pt for Aug and Sept...pt sched to see Dr. Lynford Citizen on 8.27 @ 4:15pm

## 2014-03-03 ENCOUNTER — Other Ambulatory Visit: Payer: Self-pay | Admitting: *Deleted

## 2014-03-07 ENCOUNTER — Ambulatory Visit (INDEPENDENT_AMBULATORY_CARE_PROVIDER_SITE_OTHER): Payer: Medicare Other | Admitting: Pharmacist Clinician (PhC)/ Clinical Pharmacy Specialist

## 2014-03-07 ENCOUNTER — Ambulatory Visit (INDEPENDENT_AMBULATORY_CARE_PROVIDER_SITE_OTHER): Payer: Medicare Other | Admitting: Cardiovascular Disease

## 2014-03-07 ENCOUNTER — Encounter: Payer: Medicare Other | Admitting: Cardiothoracic Surgery

## 2014-03-07 ENCOUNTER — Encounter: Payer: Self-pay | Admitting: Cardiothoracic Surgery

## 2014-03-07 ENCOUNTER — Institutional Professional Consult (permissible substitution) (INDEPENDENT_AMBULATORY_CARE_PROVIDER_SITE_OTHER): Payer: Medicare Other | Admitting: Cardiothoracic Surgery

## 2014-03-07 ENCOUNTER — Other Ambulatory Visit: Payer: Self-pay | Admitting: *Deleted

## 2014-03-07 ENCOUNTER — Encounter: Payer: Self-pay | Admitting: Cardiovascular Disease

## 2014-03-07 VITALS — BP 120/77 | HR 92 | Resp 20 | Ht 63.0 in | Wt 146.0 lb

## 2014-03-07 VITALS — BP 148/85 | HR 76 | Ht 63.0 in | Wt 145.0 lb

## 2014-03-07 DIAGNOSIS — E785 Hyperlipidemia, unspecified: Secondary | ICD-10-CM

## 2014-03-07 DIAGNOSIS — I10 Essential (primary) hypertension: Secondary | ICD-10-CM

## 2014-03-07 DIAGNOSIS — I482 Chronic atrial fibrillation, unspecified: Secondary | ICD-10-CM

## 2014-03-07 DIAGNOSIS — Z7901 Long term (current) use of anticoagulants: Secondary | ICD-10-CM

## 2014-03-07 DIAGNOSIS — Z853 Personal history of malignant neoplasm of breast: Secondary | ICD-10-CM

## 2014-03-07 DIAGNOSIS — R911 Solitary pulmonary nodule: Secondary | ICD-10-CM

## 2014-03-07 DIAGNOSIS — I4891 Unspecified atrial fibrillation: Secondary | ICD-10-CM

## 2014-03-07 LAB — POCT INR: INR: 2.1

## 2014-03-07 NOTE — Progress Notes (Signed)
03/07/2014 [place]   01/04/1925  [id number]  Primary Physician  Melinda Crutch, MD Primary Cardiologist: Lorretta Harp MD Renae Gloss   HPI:  The patient is an 80 year old thin and frail appearing widowed Caucasian female, mother of 91, grandmother to 5 grandchildren who is accompanied by one of her daughters today. I last saw her one year ago.. She has a history of hypertension, hyperlipidemia, and chronic atrial fibrillation, rate controlled on Coumadin anticoagulation which we follow her at our Coumadin clinic. When I saw her in early December she was complaining of decreased exercise tolerance with increasing dyspnea on exertion with some chest pressure, new compared to her prior visit. Her weight was also up 10 pounds, and she had new pitting edema on her lower extremities. I doubled her Lasix which resulted in improvement in her edema and shortness of breath. A 2D echo showed interval mild decrease in LV function with an EF in the 45-50% range compared to greater than 55% back in 2010. A Myoview stress test was normal. Lab work was normal as well with a BNP of 192. A chest x-ray did show right lower lobe nodules, and a followup CT scan confirmed this with an interval development of a 1.5 x 1.2 cm nodule in the right lower lobe which the reading radiologist, Dr. Madie Reno, read as suspicious for possible primary bronchogenic neoplasm, though apparently she has had nodules on CTs in the past as well. He also mentioned atherosclerosis in the LAD and RCA.   Since I saw her a year ago she denies chest pain or shortness of breath. She has seen Dr. Illene Regulus, her oncologist and Dr. Servando Snare at his request for evaluation of her pulmonary nodules. She is scheduled to have a PET scan.   Current Outpatient Prescriptions  Medication Sig Dispense Refill  . atorvastatin (LIPITOR) 40 MG tablet Take 1 tablet (40 mg total) by mouth daily.  90 tablet  1  . carvedilol (COREG)  3.125 MG tablet TAKE ONE TABLET BY MOUTH TWICE DAILY WITH MEALS  180 tablet  2  . citalopram (CELEXA) 20 MG tablet Take 20 mg by mouth daily.       Marland Kitchen diltiazem (DILACOR XR) 180 MG 24 hr capsule TAKE ONE CAPSULE BY MOUTH ONCE DAILY  90 capsule  2  . furosemide (LASIX) 40 MG tablet TAKE ONE TABLET BY MOUTH TWICE DAILY  180 tablet  2  . levothyroxine (SYNTHROID, LEVOTHROID) 112 MCG tablet Take 112 mcg by mouth daily before breakfast.      . warfarin (COUMADIN) 4 MG tablet Take 1 tablet (4 mg total) by mouth daily. Coumadin monitoring per Dr. Gwenlyn Found  90 tablet  1  . zolpidem (AMBIEN) 5 MG tablet Take 5 mg by mouth at bedtime as needed for sleep.       No current facility-administered medications for this visit.    No Known Allergies  History   Social History  . Marital Status: Married    Spouse Name: N/A    Number of Children: N/A  . Years of Education: N/A   Occupational History  . Not on file.   Social History Main Topics  . Smoking status: Never Smoker   . Smokeless tobacco: Not on file  . Alcohol Use: No  . Drug Use: No  . Sexual Activity: No   Other Topics Concern  . Not on file   Social History Narrative  . No narrative on file  Review of Systems: General: negative for chills, fever, night sweats or weight changes.  Cardiovascular: negative for chest pain, dyspnea on exertion, edema, orthopnea, palpitations, paroxysmal nocturnal dyspnea or shortness of breath Dermatological: negative for rash Respiratory: negative for cough or wheezing Urologic: negative for hematuria Abdominal: negative for nausea, vomiting, diarrhea, bright red blood per rectum, melena, or hematemesis Neurologic: negative for visual changes, syncope, or dizziness All other systems reviewed and are otherwise negative except as noted above.    Blood pressure 148/85, pulse 76, height 5\' 3"  (1.6 m), weight 145 lb (65.772 kg).  General appearance: alert and no distress Neck: no adenopathy, no  carotid bruit, no JVD, supple, symmetrical, trachea midline and thyroid not enlarged, symmetric, no tenderness/mass/nodules Lungs: clear to auscultation bilaterally Heart: irregularly irregular rhythm Extremities: extremities normal, atraumatic, no cyanosis or edema  EKG atrial fibrillation with a ventricular response of 77 and nonspecific ST and T wave changes  ASSESSMENT AND PLAN:   Chronic atrial fibrillation Rate controlled on Coumadin anticoagulation  Hypertension Controlled on current medications  Hyperlipidemia On statin therapy followed by her PCP      Lorretta Harp MD [facility], [facility] 03/07/2014 5:14 PM

## 2014-03-07 NOTE — Patient Instructions (Signed)
Your physician wants you to follow-up in: 1 year with Dr Berry. You will receive a reminder letter in the mail two months in advance. If you don't receive a letter, please call our office to schedule the follow-up appointment.  

## 2014-03-07 NOTE — Assessment & Plan Note (Signed)
On statin therapy followed by her PCP 

## 2014-03-07 NOTE — Progress Notes (Signed)
[address]       ,[place] [address]             [place] Record #[id number] Date of Birth: 09/19/1924  Referring:Dr [name] Primary Care:  Melinda Crutch, MD  Chief Complaint:    Chief Complaint  Patient presents with  . Lung Lesion    Surgical eval on right lung nodule, Chest CT 03/01/14    History of Present Illness:    Leah Guerra 80 y.o. female is seen in the office  today for enlarging right lung mass. Patient seen 2004 for right lung lesion. Needle bx at that time resulted in ptx and no tissue. Continued follow showed resolution of that lesion. Now with enlarging right lower lobe mass. Patient has history of breast cancer treated with surgery and radiation     Current Activity/ Functional Status:  Patient is independent with mobility/ambulation, transfers, ADL's, IADL's.   Zubrod Score: At the time of surgery this patient's most appropriate activity status/level should be described as: []     0    Normal activity, no symptoms []     1    Restricted in physical strenuous activity but ambulatory, able to do out light work []     2    Ambulatory and capable of self care, unable to do work activities, up and about               >50 % of waking hours                              [x]     3    Only limited self care, in bed greater than 50% of waking hours []     4    Completely disabled, no self care, confined to bed or chair []     5    Moribund   Past Medical History  Diagnosis Date  . Pleural effusion   . Hypertension   . CHF (congestive heart failure)   . Atrial fibrillation   . Hypothyroid   . Breast cancer   . Lung nodule   . Hyperlipidemia     Past Surgical History  Procedure Laterality Date  . Joint replacement    . Masectomy Left   . Fracture surgery      No family history on file.  History   Social History  . Marital Status: Married    Spouse Name: N/A      Number of Children: N/A  . Years of Education: N/A   Occupational History  . Not on file.   Social History Main Topics  . Smoking status: Never Smoker   . Smokeless tobacco: Not on file  . Alcohol Use: No  . Drug Use: No  . Sexual Activity: No   Other Topics Concern  . Not on file   Social History Narrative  . No narrative on file    History  Smoking status  . Never Smoker   Smokeless tobacco  . Not on file    History  Alcohol Use No     No Known Allergies  Current Outpatient Prescriptions  Medication Sig Dispense Refill  . atorvastatin (LIPITOR) 40 MG tablet Take 1 tablet (40 mg total) by mouth daily.  90 tablet  1  .  carvedilol (COREG) 3.125 MG tablet TAKE ONE TABLET BY MOUTH TWICE DAILY WITH MEALS  180 tablet  2  . citalopram (CELEXA) 20 MG tablet Take 20 mg by mouth daily.       Marland Kitchen diltiazem (DILACOR XR) 180 MG 24 hr capsule TAKE ONE CAPSULE BY MOUTH ONCE DAILY  90 capsule  2  . furosemide (LASIX) 40 MG tablet TAKE ONE TABLET BY MOUTH TWICE DAILY  180 tablet  2  . levothyroxine (SYNTHROID, LEVOTHROID) 112 MCG tablet Take 112 mcg by mouth daily before breakfast.      . warfarin (COUMADIN) 4 MG tablet Take 1 tablet (4 mg total) by mouth daily. Coumadin monitoring per Dr. Gwenlyn Found  90 tablet  1  . zolpidem (AMBIEN) 5 MG tablet Take 5 mg by mouth at bedtime as needed for sleep.       No current facility-administered medications for this visit.     Review of Systems:     Cardiac Review of Systems: Y or N  Chest Pain [    ]  Resting SOB [ y  ] Exertional SOB  Blue.Reese  ]  Orthopnea [  ]   Pedal Edema [   ]    Palpitations [  ] Syncope  [  ]   Presyncope [   ]  General Review of Systems: [Y] = yes [  ]=no Constitional: recent weight change [  ];  Wt loss over the last 3 months [   ] anorexia [  ]; fatigue Blue.Reese  ]; nausea [  ]; night sweats [  ]; fever [  ]; or chills [  ];          Dental: poor dentition[  ]; Last Dentist visit:   Eye : blurred vision [  ]; diplopia [   ];  vision changes [  ];  Amaurosis fugax[  ]; Resp: cough [  ];  wheezing[y  ];  hemoptysis[n  ]; shortness of breath[ y ]; paroxysmal nocturnal dyspnea[  ]; dyspnea on exertion[  ]; or orthopnea[  ];  GI:  gallstones[  ], vomiting[  ];  dysphagia[  ]; melena[  ];  hematochezia [  ]; heartburn[  ];   Hx of  Colonoscopy[  ]; GU: kidney stones [  ]; hematuria[  ];   dysuria [  ];  nocturia[  ];  history of     obstruction [  ]; urinary frequency [  ]             Skin: rash, swelling[  ];, hair loss[  ];  peripheral edema[  ];  or itching[  ]; Musculosketetal: myalgias[  ];  joint swelling[  ];  joint erythema[  ];  joint pain[  ];  back pain[  ];  Heme/Lymph: bruising[  ];  bleeding[  ];  anemia[  ];  Neuro: TIA[  ];  headaches[  ];  stroke[  ];  vertigo[  ];  seizures[  ];   paresthesias[  ];  difficulty walking[  ]; memory problems  Psych:depression[  ]; anxiety[  ];  Endocrine: diabetes[ n ];  thyroid dysfunction[  n];  Immunizations: Flu up to date [  ]; Pneumococcal up to date [  ];  Other:  Physical Exam: BP 120/77  Pulse 92  Resp 20  Ht 5\' 3"  (1.6 m)  Wt 146 lb (66.225 kg)  BMI 25.87 kg/m2  SpO2 95%  PHYSICAL EXAMINATION:  General appearance: alert, cooperative, appears older than  stated age, fatigued and slowed mentation Neurologic: intact Heart: irregularly irregular rhythm Lungs: diminished breath sounds bibasilar Abdomen: soft, non-tender; bowel sounds normal; no masses,  no organomegaly Extremities: extremities normal, atraumatic, no cyanosis or edema and Homans sign is negative, no sign of DVT Wound: left mastectomy bilaterial edema and venous statis changes    Diagnostic Studies & Laboratory data:     Recent Radiology Findings:   Ct Chest Wo Contrast  02/28/2014   CLINICAL DATA:  History breast cancer diagnosed in 1998. Status post left mastectomy and radiation therapy, now complete. Followup evaluation for lung nodules. Shortness of breath.  EXAM: CT CHEST WITHOUT  CONTRAST  TECHNIQUE: Multidetector CT imaging of the chest was performed following the standard protocol without IV contrast.  COMPARISON:  Chest CT 10/28/2013.  FINDINGS: Mediastinum: Heart size is mildly enlarged. There is no significant pericardial fluid, thickening or pericardial calcification. There is atherosclerosis of the thoracic aorta, the great vessels of the mediastinum and the coronary arteries, including calcified atherosclerotic plaque in the left main, left anterior descending and right coronary arteries. Interval enlargement of a lower right paratracheal lymph node which currently measures 1.7 cm in short axis. Enlargement of subcarinal lymph node which currently measures 1.5 cm in short axis. Mildly enlarged distal paraesophageal lymph node measuring 11 mm in short axis. Accurate assessment for hilar lymphadenopathy is not possible on today's non contrast CT examination. Esophagus is unremarkable in appearance.  Lungs/Pleura: Compared to the prior examination the irregular macrolobulated nodule with spiculated margins in the right lower lobe has increased in size, currently measuring approximately 2.4 x 2.2 cm (image 40 of series 5). This lesion has slowly progressively enlarged since 07/28/2012. Since that time there has been a significant interval increase in size and number of what are now innumerable additional perilymphatic micro and macronodules throughout the right lung, the overall appearance of which is concerning for progressively worsening lymphangitic spread of disease. Strictly speaking, and unusual appearance of an atypical infectious process could also account for these findings. Multifocal bronchial wall thickening throughout the lungs bilaterally, most severe in the right middle lobe where there is also some mild cylindrical and varicose bronchiectasis and some chronic architectural distortion, compatible with chronic post infectious or inflammatory scarring. Small bilateral  pleural effusions (stable on the right and decreased slightly on the left compared to the prior examination). Extensive bilateral apical (left greater than right) architectural distortion and bronchiectasis, most compatible with chronic post infectious or inflammatory scarring (some of the findings in the left apex may relate to prior radiation therapy). Stable subpleural reticulation in the anterior aspect of the left lung, presumably related to prior radiation therapy.  Upper Abdomen: Unremarkable.  Musculoskeletal: Status post left modified radical mastectomy and axillary nodal dissection. No soft tissue mass along the left chest wall or left axillary region to suggest local recurrence of disease. There are no aggressive appearing lytic or blastic lesions noted in the visualized portions of the skeleton.  IMPRESSION: 1. Progressively worsening findings in the right lower lobe which remain concerning for progression of malignancy with dominant 2.4 x 2.2 cm right lower lobe nodule, and evidence suggestive of worsening lymphangitic spread of tumor in the right lower lobe. There is also progressively worsening mediastinal adenopathy, as above. An unusual appearing atypical infectious process could also account for these findings, however, this is not favored. Correlation with biopsy may provide additional diagnostic information if clinically appropriate. 2. Slight decrease in small left pleural effusion. Small right pleural effusion is  unchanged. 3. Additional incidental findings, as above, similar prior examinations.   Electronically Signed   By: Vinnie Langton M.D.   On: 02/28/2014 13:17      Recent Lab Findings: Lab Results  Component Value Date   WBC 7.4 02/10/2013   HGB 12.0 02/10/2013   HCT 36.4 02/10/2013   PLT 298 02/10/2013   GLUCOSE 94 02/10/2013   ALT 14 02/09/2013   AST 22 02/09/2013   NA 140 02/10/2013   K 3.5 02/11/2013   CL 101 02/10/2013   CREATININE 0.74 02/10/2013   BUN 13 02/10/2013   CO2  30 02/10/2013   TSH 6.479* 02/08/2013   INR 4.4 02/28/2014      Assessment / Plan:   Assessment/Plan:  1. Stage III-A left-sided breast cancer diagnosed in February of 2000. She was maintained on Femara from February 2005 through November 2008. 2. History of chronic parenchymal lung densities and a borderline elevated CA27.29. Multiple CT scans have been negative for evidence of disease progression. With enlarging right lower lobe lung lsio 3. Atrial fibrillation on chronic coumadin 4. Status post a fall with a left hip fracture June 2014. 5. Memory loss  I have reommended and discussed with patient and daughter proceeding with PET scan to evaluate right lung mass that may be primary lung cancer vs lymphatic spread of metastatic lung cancer. After PET WILL SCHEDULE needle bx of dominate right lower lung nodule . Patient is not surgical candidate for surgical resection of lung mass.      Grace Isaac MD      [address] [place] [address] Office [phone]   Beeper [phone]  03/07/2014 2:44 PM

## 2014-03-07 NOTE — Assessment & Plan Note (Signed)
Controlled on current medications 

## 2014-03-07 NOTE — Assessment & Plan Note (Signed)
Rate controlled on Coumadin anticoagulation 

## 2014-03-08 ENCOUNTER — Encounter: Payer: Medicare Other | Admitting: Cardiothoracic Surgery

## 2014-03-11 ENCOUNTER — Encounter (HOSPITAL_COMMUNITY)
Admission: RE | Admit: 2014-03-11 | Discharge: 2014-03-11 | Disposition: A | Discharge status: Home / Self Care | Payer: Medicare Other | Source: Ambulatory Visit | Attending: Cardiothoracic Surgery | Admitting: Cardiothoracic Surgery

## 2014-03-11 ENCOUNTER — Ambulatory Visit (HOSPITAL_COMMUNITY): Payer: Medicare Other

## 2014-03-11 ENCOUNTER — Encounter (HOSPITAL_COMMUNITY): Payer: Self-pay

## 2014-03-11 DIAGNOSIS — R911 Solitary pulmonary nodule: Secondary | ICD-10-CM

## 2014-03-11 LAB — GLUCOSE, CAPILLARY: Glucose-Capillary: 96 mg/dL (ref 70–99)

## 2014-03-11 MED ORDER — FLUDEOXYGLUCOSE F - 18 (FDG) INJECTION
7.3200 | Freq: Once | INTRAVENOUS | Status: AC | PRN
Start: 2014-03-11 — End: 2014-03-11
  Administered 2014-03-11: 7.32 via INTRAVENOUS

## 2014-03-16 ENCOUNTER — Encounter (HOSPITAL_COMMUNITY): Payer: Self-pay | Admitting: Pharmacy Technician

## 2014-03-17 ENCOUNTER — Ambulatory Visit: Payer: Medicare Other | Admitting: Oncology

## 2014-03-18 ENCOUNTER — Other Ambulatory Visit: Payer: Self-pay | Admitting: Radiology

## 2014-03-21 ENCOUNTER — Other Ambulatory Visit: Payer: Self-pay | Admitting: Cardiothoracic Surgery

## 2014-03-21 ENCOUNTER — Other Ambulatory Visit: Payer: Self-pay | Admitting: *Deleted

## 2014-03-21 ENCOUNTER — Other Ambulatory Visit: Payer: Self-pay | Admitting: Radiology

## 2014-03-21 ENCOUNTER — Encounter (HOSPITAL_COMMUNITY): Payer: Self-pay

## 2014-03-21 ENCOUNTER — Ambulatory Visit (HOSPITAL_COMMUNITY)
Admission: RE | Admit: 2014-03-21 | Discharge: 2014-03-21 | Disposition: A | Discharge status: Home / Self Care | Payer: Medicare Other | Source: Ambulatory Visit | Attending: Cardiothoracic Surgery | Admitting: Cardiothoracic Surgery

## 2014-03-21 DIAGNOSIS — R911 Solitary pulmonary nodule: Secondary | ICD-10-CM

## 2014-03-21 DIAGNOSIS — Z5309 Procedure and treatment not carried out because of other contraindication: Secondary | ICD-10-CM | POA: Insufficient documentation

## 2014-03-21 DIAGNOSIS — J984 Other disorders of lung: Secondary | ICD-10-CM | POA: Insufficient documentation

## 2014-03-21 LAB — CBC
HEMATOCRIT: 39.5 % (ref 36.0–46.0)
Hemoglobin: 12.9 g/dL (ref 12.0–15.0)
MCH: 28.5 pg (ref 26.0–34.0)
MCHC: 32.7 g/dL (ref 30.0–36.0)
MCV: 87.2 fL (ref 78.0–100.0)
Platelets: 256 10*3/uL (ref 150–400)
RBC: 4.53 MIL/uL (ref 3.87–5.11)
RDW: 15.7 % — AB (ref 11.5–15.5)
WBC: 6.9 10*3/uL (ref 4.0–10.5)

## 2014-03-21 LAB — APTT: APTT: 38 s — AB (ref 24–37)

## 2014-03-21 LAB — PROTIME-INR
INR: 2.32 — ABNORMAL HIGH (ref 0.00–1.49)
Prothrombin Time: 25.5 seconds — ABNORMAL HIGH (ref 11.6–15.2)

## 2014-03-21 MED ORDER — MIDAZOLAM HCL 2 MG/2ML IJ SOLN
INTRAMUSCULAR | Status: AC
  Filled 2014-03-21: qty 4

## 2014-03-21 MED ORDER — LIDOCAINE-EPINEPHRINE 1 %-1:100000 IJ SOLN
INTRAMUSCULAR | Status: AC
  Filled 2014-03-21: qty 1

## 2014-03-21 MED ORDER — SODIUM CHLORIDE 0.9 % IV SOLN
Freq: Once | INTRAVENOUS | Status: DC
Start: 2014-03-21 — End: 2014-03-22

## 2014-03-21 MED ORDER — FENTANYL CITRATE 0.05 MG/ML IJ SOLN
INTRAMUSCULAR | Status: AC
  Filled 2014-03-21: qty 2

## 2014-03-21 NOTE — Progress Notes (Signed)
After reviewing labs with Dr Pascal Lux, procedure will be rescheduled.

## 2014-03-21 NOTE — H&P (Signed)
Pt's INR remains persistently elevated on today's labs, as such, the procedure has been postponed.  Pt will return this Thursday, 8/27 for repeat labs and (hopefully) CT guided Bx at 11:00.  Pt and pt's DTR instructed to continue not taking Coumadin.

## 2014-03-21 NOTE — H&P (Signed)
Chief Complaint: "I'm here for a lung biopsy" Referring Physician:Gerhardt HPI: Leah Guerra is an 80 y.o. female with hx of breast cancer and known right pulm lesions. Had biopsy in 2004, resulting in PTX and chest tube. Has now had follow up PET scan showing continued hypermetabolic activity as well as adenopathy suggesting malignant activity. She is referred for biopsy. Chart, PMHx, meds, labs reviewed. Has been on Coumadin, stopped 5 days ago.   Past Medical History:  Past Medical History  Diagnosis Date  . Pleural effusion   . Hypertension   . CHF (congestive heart failure)   . Atrial fibrillation   . Hypothyroid   . Breast cancer   . Lung nodule   . Hyperlipidemia     Past Surgical History:  Past Surgical History  Procedure Laterality Date  . Joint replacement    . Masectomy Left   . Fracture surgery      Family History: No family history on file.  Social History:  reports that she has never smoked. She does not have any smokeless tobacco history on file. She reports that she does not drink alcohol or use illicit drugs.  Allergies: No Known Allergies  Medications:   Medication List    ASK your doctor about these medications       atorvastatin 40 MG tablet  Commonly known as:  LIPITOR  Take 1 tablet (40 mg total) by mouth daily.     carvedilol 3.125 MG tablet  Commonly known as:  COREG  Take 3.125 mg by mouth 2 (two) times daily with a meal.     citalopram 20 MG tablet  Commonly known as:  CELEXA  Take 20 mg by mouth daily.     diltiazem 180 MG 24 hr capsule  Commonly known as:  DILACOR XR  Take 180 mg by mouth daily.     furosemide 40 MG tablet  Commonly known as:  LASIX  Take 40 mg by mouth daily.     levothyroxine 112 MCG tablet  Commonly known as:  SYNTHROID, LEVOTHROID  Take 112 mcg by mouth daily before breakfast.     warfarin 4 MG tablet  Commonly known as:  COUMADIN  Take 4-6 mg by mouth daily. Take 6 mg by mouth on Thursday  and take 4 mg by mouth daily on other days.     zolpidem 5 MG tablet  Commonly known as:  AMBIEN  Take 5 mg by mouth at bedtime as needed for sleep.        Please HPI for pertinent positives, otherwise complete 10 system ROS negative.  Physical Exam: BP 144/93  Pulse 86  Temp(Src) 98.5 F (36.9 C) (Oral)  Resp 18  Ht 5' 3.5" (1.613 m)  Wt 150 lb (68.04 kg)  BMI 26.15 kg/m2  SpO2 94% Body mass index is 26.15 kg/(m^2).   General Appearance:  Alert, cooperative, no distress, appears stated age  Head:  Normocephalic, without obvious abnormality, atraumatic  ENT: Unremarkable  Neck: Supple, symmetrical, trachea midline  Lungs:   Clear to auscultation bilaterally, no w/r/r, respirations unlabored without use of accessory muscles.  Chest Wall:  No tenderness or deformity  Heart:  Regular rate and rhythm, S1, S2 normal, no murmur, rub or gallop.  Abdomen:   Soft, non-tender, non distended.  Neurologic: Normal affect, no gross deficits.   Results for orders placed during the hospital encounter of 03/21/14 (from the past 48 hour(s))  CBC     Status: Abnormal   Collection  Time    03/21/14  9:52 AM      Result Value Ref Range   WBC 6.9  4.0 - 10.5 K/uL   RBC 4.53  3.87 - 5.11 MIL/uL   Hemoglobin 12.9  12.0 - 15.0 g/dL   HCT 39.5  36.0 - 46.0 %   MCV 87.2  78.0 - 100.0 fL   MCH 28.5  26.0 - 34.0 pg   MCHC 32.7  30.0 - 36.0 g/dL   RDW 15.7 (*) 11.5 - 15.5 %   Platelets 256  150 - 400 K/uL   No results found.  Assessment/Plan (R)pulmonary lesions For CT guided biopsy. Discussed risks, complications including PTX/chest tube, hemorrhage/hemoptysis, respiratory failure, and that complications could result in death. Labs pending Consent signed in chart  Ascencion Dike PA-C 03/21/2014, 10:45 AM

## 2014-03-22 ENCOUNTER — Other Ambulatory Visit: Payer: Self-pay | Admitting: Radiology

## 2014-03-24 ENCOUNTER — Ambulatory Visit: Payer: Medicare Other | Admitting: Cardiothoracic Surgery

## 2014-03-24 ENCOUNTER — Encounter (HOSPITAL_COMMUNITY): Payer: Self-pay

## 2014-03-24 ENCOUNTER — Ambulatory Visit (HOSPITAL_COMMUNITY)
Admission: RE | Admit: 2014-03-24 | Discharge: 2014-03-24 | Disposition: A | Discharge status: Home / Self Care | Payer: Medicare Other | Source: Ambulatory Visit | Attending: Interventional Radiology | Admitting: Interventional Radiology

## 2014-03-24 ENCOUNTER — Institutional Professional Consult (permissible substitution): Payer: Medicare Other | Admitting: Internal Medicine

## 2014-03-24 ENCOUNTER — Ambulatory Visit (HOSPITAL_COMMUNITY)
Admission: RE | Admit: 2014-03-24 | Discharge: 2014-03-24 | Disposition: A | Discharge status: Home / Self Care | Payer: Medicare Other | Source: Ambulatory Visit | Attending: Cardiothoracic Surgery | Admitting: Cardiothoracic Surgery

## 2014-03-24 DIAGNOSIS — R911 Solitary pulmonary nodule: Secondary | ICD-10-CM | POA: Insufficient documentation

## 2014-03-24 DIAGNOSIS — R599 Enlarged lymph nodes, unspecified: Secondary | ICD-10-CM | POA: Diagnosis not present

## 2014-03-24 DIAGNOSIS — Z853 Personal history of malignant neoplasm of breast: Secondary | ICD-10-CM | POA: Diagnosis not present

## 2014-03-24 LAB — PROTIME-INR
INR: 1.6 — ABNORMAL HIGH (ref 0.00–1.49)
Prothrombin Time: 19.1 seconds — ABNORMAL HIGH (ref 11.6–15.2)

## 2014-03-24 LAB — APTT: aPTT: 24 seconds (ref 24–37)

## 2014-03-24 LAB — CBC
HCT: 42.6 % (ref 36.0–46.0)
HEMOGLOBIN: 13.9 g/dL (ref 12.0–15.0)
MCH: 29.1 pg (ref 26.0–34.0)
MCHC: 32.6 g/dL (ref 30.0–36.0)
MCV: 89.3 fL (ref 78.0–100.0)
PLATELETS: 261 10*3/uL (ref 150–400)
RBC: 4.77 MIL/uL (ref 3.87–5.11)
RDW: 15.6 % — ABNORMAL HIGH (ref 11.5–15.5)
WBC: 9.1 10*3/uL (ref 4.0–10.5)

## 2014-03-24 MED ORDER — MIDAZOLAM HCL 2 MG/2ML IJ SOLN
INTRAMUSCULAR | Status: AC | PRN
  Administered 2014-03-24: .25 mg via INTRAVENOUS

## 2014-03-24 MED ORDER — SODIUM CHLORIDE 0.9 % IV SOLN: Freq: Once | INTRAVENOUS | Status: DC

## 2014-03-24 MED ORDER — LIDOCAINE HCL 1 % IJ SOLN
INTRAMUSCULAR | Status: DC
Start: 2014-03-24 — End: 2014-03-25
  Filled 2014-03-24: qty 10

## 2014-03-24 MED ORDER — MIDAZOLAM HCL 2 MG/2ML IJ SOLN
INTRAMUSCULAR | Status: DC
Start: 2014-03-24 — End: 2014-03-25
  Filled 2014-03-24: qty 2

## 2014-03-24 MED ORDER — FENTANYL CITRATE 0.05 MG/ML IJ SOLN
INTRAMUSCULAR | Status: AC
  Filled 2014-03-24: qty 2

## 2014-03-24 MED ORDER — FENTANYL CITRATE 0.05 MG/ML IJ SOLN
INTRAMUSCULAR | Status: AC | PRN
  Administered 2014-03-24: 12.5 ug via INTRAVENOUS

## 2014-03-24 NOTE — H&P (Signed)
Leah Guerra is an 80 y.o. female.   Chief Complaint: Pt with remote Hx breast cancer Has had known R lung nodules- unchanged in follow up for many yrs Noted enlargement on Rt lung nodules with development of lymphadenopathy on CT scan +PET 03/11/2014 Now scheduled for Rt lung nodule biopsy  Has had RUL nodule biopsied 2004- +PTX and required chest tube  Off coumadin now x 8 days Previous INR 2.32 (8/24)--rescheduled to today  HPI: CHF; HTN; Afib; hypothyroid; Breast Ca; HLD  Past Medical History  Diagnosis Date  . Pleural effusion   . Hypertension   . CHF (congestive heart failure)   . Atrial fibrillation   . Hypothyroid   . Breast cancer   . Lung nodule   . Hyperlipidemia     Past Surgical History  Procedure Laterality Date  . Joint replacement    . Masectomy Left   . Fracture surgery      No family history on file. Social History:  reports that she has never smoked. She does not have any smokeless tobacco history on file. She reports that she does not drink alcohol or use illicit drugs.  Allergies: No Known Allergies   (Not in Guerra hospital admission)  Results for orders placed during the hospital encounter of 03/24/14 (from the past 48 hour(s))  APTT     Status: None   Collection Time    03/24/14  7:30 AM      Result Value Ref Range   aPTT 24  24 - 37 seconds  CBC     Status: Abnormal   Collection Time    03/24/14  7:30 AM      Result Value Ref Range   WBC 9.1  4.0 - 10.5 K/uL   RBC 4.77  3.87 - 5.11 MIL/uL   Hemoglobin 13.9  12.0 - 15.0 g/dL   HCT 42.6  36.0 - 46.0 %   MCV 89.3  78.0 - 100.0 fL   MCH 29.1  26.0 - 34.0 pg   MCHC 32.6  30.0 - 36.0 g/dL   RDW 15.6 (*) 11.5 - 15.5 %   Platelets 261  150 - 400 K/uL  PROTIME-INR     Status: Abnormal   Collection Time    03/24/14  7:30 AM      Result Value Ref Range   Prothrombin Time 19.1 (*) 11.6 - 15.2 seconds   INR 1.60 (*) 0.00 - 1.49   No results found.  Review of Systems   Constitutional: Negative for fever and chills.  Respiratory: Negative for shortness of breath.   Cardiovascular: Negative for chest pain.  Gastrointestinal: Negative for nausea, vomiting and abdominal pain.  Musculoskeletal: Negative for back pain.  Neurological: Positive for weakness.  Psychiatric/Behavioral: Negative for substance abuse.    Blood pressure 149/83, pulse 78, temperature 98.2 F (36.8 C), temperature source Oral, resp. rate 18, height 5\' 3"  (1.6 m), weight 68.947 kg (152 lb), SpO2 94.00%. Physical Exam  Constitutional: She is oriented to person, place, and time. She appears well-nourished.  Cardiovascular: Normal rate.   No murmur heard. Slightly irreg rate  Respiratory: Effort normal and breath sounds normal. She has no wheezes.  GI: Soft. Bowel sounds are normal. There is no tenderness.  Musculoskeletal: Normal range of motion.  Neurological: She is alert and oriented to person, place, and time.  Skin: Skin is warm and dry.  Psychiatric: She has Guerra normal mood and affect. Her behavior is normal. Judgment and thought content normal.  Does seem to be minimally confused with certain questions.   Is alert/oriented to name/date/place     Assessment/Plan Hx Breast Ca Rt lung nodules +PET New development LAN +PET Off coumadin 8 days Now scheduled for Rt lung mass biopsy in IR' Pt and dtr aware of procedure benefits and risks and agreeable to proceed Consent signed and in chart Pending INR for today   Leah Guerra 03/24/2014, 8:30 AM

## 2014-03-24 NOTE — Progress Notes (Signed)
Ok to d/c home per Darryll Capers

## 2014-03-24 NOTE — Discharge Instructions (Signed)
Needle Biopsy of Lung, Care After °Refer to this sheet in the next few weeks. These instructions provide you with information on caring for yourself after your procedure. Your health care provider may also give you more specific instructions. Your treatment has been planned according to current medical practices, but problems sometimes occur. Call your health care provider if you have any problems or questions after your procedure. °WHAT TO EXPECT AFTER THE PROCEDURE °· A bandage will be applied over the area where the needle was inserted. You may be asked to apply pressure to the bandage for several minutes to ensure there is minimal bleeding. °· In most cases, you can leave when your needle biopsy procedure is completed. Do not drive yourself home. Someone else should take you home. °· If you received an IV sedative or general anesthetic, you will be taken to a comfortable place to relax while the medicine wears off. °· If you have upcoming travel scheduled, talk to your health care provider about when it is safe to travel by air after the procedure. °HOME CARE INSTRUCTIONS °· Expect to take it easy for the rest of the day. °· Protect the area where you received the needle biopsy by keeping the bandage in place for as long as instructed. °· You may feel some mild pain or discomfort in the area, but this should stop in a day or two. °· Take medicines only as directed by your health care provider. °SEEK MEDICAL CARE IF:  °· You have pain at the biopsy site that worsens or is not helped by medicine. °· You have swelling or drainage at the needle biopsy site. °· You have a fever. °SEEK IMMEDIATE MEDICAL CARE IF:  °· You have new or worsening shortness of breath. °· You have chest pain. °· You are coughing up blood. °· You have bleeding that does not stop with pressure or a bandage. °· You develop light-headedness or fainting. °Document Released: 05/12/2007 Document Revised: 11/29/2013 Document Reviewed:  12/07/2012 °ExitCare® Patient Information ©2015 [employer]. This information is not intended to replace advice given to you by your health care provider. Make sure you discuss any questions you have with your health care provider. ° °

## 2014-03-24 NOTE — H&P (Signed)
Minimally elevated INR of 1.6 is just above our normal threshold of 1.5.  There is a hypermetabolic subpleural nodule that we plan to target which is outside of the lung and therefore not a 'deep organ'.  Given this low risk location, I feel the INR of 1.6 poses a minimally increased risk of bleeding.  I discussed this with the patient and her son and we all agreed to proceed with the biopsy as planned.  Signed,  Criselda Peaches, MD [facility]

## 2014-03-28 ENCOUNTER — Emergency Department (HOSPITAL_BASED_OUTPATIENT_CLINIC_OR_DEPARTMENT_OTHER): Payer: Medicare Other

## 2014-03-28 ENCOUNTER — Encounter (HOSPITAL_BASED_OUTPATIENT_CLINIC_OR_DEPARTMENT_OTHER): Payer: Self-pay | Admitting: Emergency Medicine

## 2014-03-28 ENCOUNTER — Inpatient Hospital Stay (HOSPITAL_BASED_OUTPATIENT_CLINIC_OR_DEPARTMENT_OTHER)
Admission: EM | Admit: 2014-03-28 | Discharge: 2014-04-01 | DRG: 389 | Disposition: A | Discharge status: Home / Self Care | Payer: Medicare Other | Attending: Internal Medicine | Admitting: Internal Medicine

## 2014-03-28 DIAGNOSIS — N390 Urinary tract infection, site not specified: Secondary | ICD-10-CM | POA: Diagnosis present

## 2014-03-28 DIAGNOSIS — J90 Pleural effusion, not elsewhere classified: Secondary | ICD-10-CM | POA: Diagnosis present

## 2014-03-28 DIAGNOSIS — E876 Hypokalemia: Secondary | ICD-10-CM | POA: Diagnosis present

## 2014-03-28 DIAGNOSIS — E039 Hypothyroidism, unspecified: Secondary | ICD-10-CM | POA: Diagnosis present

## 2014-03-28 DIAGNOSIS — T4275XA Adverse effect of unspecified antiepileptic and sedative-hypnotic drugs, initial encounter: Secondary | ICD-10-CM | POA: Diagnosis present

## 2014-03-28 DIAGNOSIS — I5031 Acute diastolic (congestive) heart failure: Secondary | ICD-10-CM

## 2014-03-28 DIAGNOSIS — Z9660 Presence of unspecified orthopedic joint implant: Secondary | ICD-10-CM

## 2014-03-28 DIAGNOSIS — C50919 Malignant neoplasm of unspecified site of unspecified female breast: Secondary | ICD-10-CM

## 2014-03-28 DIAGNOSIS — C7800 Secondary malignant neoplasm of unspecified lung: Secondary | ICD-10-CM | POA: Diagnosis present

## 2014-03-28 DIAGNOSIS — R0602 Shortness of breath: Secondary | ICD-10-CM

## 2014-03-28 DIAGNOSIS — I5032 Chronic diastolic (congestive) heart failure: Secondary | ICD-10-CM | POA: Diagnosis present

## 2014-03-28 DIAGNOSIS — I509 Heart failure, unspecified: Secondary | ICD-10-CM | POA: Diagnosis present

## 2014-03-28 DIAGNOSIS — I482 Chronic atrial fibrillation, unspecified: Secondary | ICD-10-CM | POA: Diagnosis present

## 2014-03-28 DIAGNOSIS — J9610 Chronic respiratory failure, unspecified whether with hypoxia or hypercapnia: Secondary | ICD-10-CM | POA: Diagnosis present

## 2014-03-28 DIAGNOSIS — Z9010 Acquired absence of unspecified breast and nipple: Secondary | ICD-10-CM

## 2014-03-28 DIAGNOSIS — Z66 Do not resuscitate: Secondary | ICD-10-CM | POA: Diagnosis present

## 2014-03-28 DIAGNOSIS — R911 Solitary pulmonary nodule: Secondary | ICD-10-CM | POA: Diagnosis present

## 2014-03-28 DIAGNOSIS — Z853 Personal history of malignant neoplasm of breast: Secondary | ICD-10-CM | POA: Diagnosis not present

## 2014-03-28 DIAGNOSIS — I10 Essential (primary) hypertension: Secondary | ICD-10-CM | POA: Diagnosis present

## 2014-03-28 DIAGNOSIS — Z7901 Long term (current) use of anticoagulants: Secondary | ICD-10-CM

## 2014-03-28 DIAGNOSIS — R109 Unspecified abdominal pain: Secondary | ICD-10-CM | POA: Diagnosis not present

## 2014-03-28 DIAGNOSIS — I4891 Unspecified atrial fibrillation: Secondary | ICD-10-CM | POA: Diagnosis present

## 2014-03-28 DIAGNOSIS — K567 Ileus, unspecified: Secondary | ICD-10-CM | POA: Diagnosis present

## 2014-03-28 DIAGNOSIS — R599 Enlarged lymph nodes, unspecified: Secondary | ICD-10-CM | POA: Diagnosis present

## 2014-03-28 DIAGNOSIS — F0390 Unspecified dementia without behavioral disturbance: Secondary | ICD-10-CM | POA: Diagnosis present

## 2014-03-28 DIAGNOSIS — E785 Hyperlipidemia, unspecified: Secondary | ICD-10-CM | POA: Diagnosis present

## 2014-03-28 DIAGNOSIS — K560 Paralytic ileus: Principal | ICD-10-CM | POA: Diagnosis present

## 2014-03-28 DIAGNOSIS — J9611 Chronic respiratory failure with hypoxia: Secondary | ICD-10-CM

## 2014-03-28 LAB — URINALYSIS, ROUTINE W REFLEX MICROSCOPIC
Bilirubin Urine: NEGATIVE
GLUCOSE, UA: NEGATIVE mg/dL
KETONES UR: NEGATIVE mg/dL
LEUKOCYTES UA: NEGATIVE
Nitrite: NEGATIVE
PH: 5.5 (ref 5.0–8.0)
Protein, ur: 100 mg/dL — AB
Specific Gravity, Urine: 1.021 (ref 1.005–1.030)
Urobilinogen, UA: 1 mg/dL (ref 0.0–1.0)

## 2014-03-28 LAB — CBC WITH DIFFERENTIAL/PLATELET
BASOS PCT: 0 % (ref 0–1)
Basophils Absolute: 0 10*3/uL (ref 0.0–0.1)
EOS PCT: 1 % (ref 0–5)
Eosinophils Absolute: 0.1 10*3/uL (ref 0.0–0.7)
HEMATOCRIT: 40.9 % (ref 36.0–46.0)
HEMOGLOBIN: 13.3 g/dL (ref 12.0–15.0)
Lymphocytes Relative: 9 % — ABNORMAL LOW (ref 12–46)
Lymphs Abs: 1.1 10*3/uL (ref 0.7–4.0)
MCH: 28.8 pg (ref 26.0–34.0)
MCHC: 32.5 g/dL (ref 30.0–36.0)
MCV: 88.5 fL (ref 78.0–100.0)
MONO ABS: 1.1 10*3/uL — AB (ref 0.1–1.0)
MONOS PCT: 9 % (ref 3–12)
Neutro Abs: 9.8 10*3/uL — ABNORMAL HIGH (ref 1.7–7.7)
Neutrophils Relative %: 81 % — ABNORMAL HIGH (ref 43–77)
Platelets: 301 10*3/uL (ref 150–400)
RBC: 4.62 MIL/uL (ref 3.87–5.11)
RDW: 15.6 % — ABNORMAL HIGH (ref 11.5–15.5)
WBC: 12.2 10*3/uL — ABNORMAL HIGH (ref 4.0–10.5)

## 2014-03-28 LAB — COMPREHENSIVE METABOLIC PANEL
ALBUMIN: 3.2 g/dL — AB (ref 3.5–5.2)
ALT: 26 U/L (ref 0–35)
ANION GAP: 15 (ref 5–15)
AST: 45 U/L — ABNORMAL HIGH (ref 0–37)
Alkaline Phosphatase: 111 U/L (ref 39–117)
BUN: 18 mg/dL (ref 6–23)
CALCIUM: 9.7 mg/dL (ref 8.4–10.5)
CO2: 27 mEq/L (ref 19–32)
CREATININE: 0.9 mg/dL (ref 0.50–1.10)
Chloride: 99 mEq/L (ref 96–112)
GFR calc Af Amer: 65 mL/min — ABNORMAL LOW (ref >90)
GFR calc non Af Amer: 56 mL/min — ABNORMAL LOW (ref >90)
Glucose, Bld: 132 mg/dL — ABNORMAL HIGH (ref 70–99)
Potassium: 3 mEq/L — ABNORMAL LOW (ref 3.7–5.3)
Sodium: 141 mEq/L (ref 137–147)
TOTAL PROTEIN: 8.3 g/dL (ref 6.0–8.3)
Total Bilirubin: 0.9 mg/dL (ref 0.3–1.2)

## 2014-03-28 LAB — URINE MICROSCOPIC-ADD ON

## 2014-03-28 LAB — TROPONIN I: Troponin I: <0.3 ng/mL

## 2014-03-28 LAB — PRO B NATRIURETIC PEPTIDE: PRO B NATRI PEPTIDE: 3172 pg/mL — AB (ref 0–450)

## 2014-03-28 LAB — TSH: TSH: 4.82 u[IU]/mL — ABNORMAL HIGH (ref 0.350–4.500)

## 2014-03-28 LAB — PROTIME-INR
INR: 2.51 — ABNORMAL HIGH (ref 0.00–1.49)
Prothrombin Time: 27.1 s — ABNORMAL HIGH (ref 11.6–15.2)

## 2014-03-28 LAB — LIPASE, BLOOD: Lipase: 42 U/L (ref 11–59)

## 2014-03-28 MED ORDER — WARFARIN - PHARMACIST DOSING INPATIENT
Freq: Every day | Status: DC
Start: 2014-03-28 — End: 2014-04-01

## 2014-03-28 MED ORDER — DILTIAZEM HCL ER 180 MG PO CP24
180.0000 mg | ORAL_CAPSULE | Freq: Every day | ORAL | Status: DC
  Administered 2014-03-28 – 2014-03-30 (×3): 180 mg via ORAL
  Filled 2014-03-28 (×5): qty 1

## 2014-03-28 MED ORDER — IPRATROPIUM-ALBUTEROL 0.5-2.5 (3) MG/3ML IN SOLN
RESPIRATORY_TRACT | Status: AC
  Filled 2014-03-28: qty 3

## 2014-03-28 MED ORDER — ONDANSETRON HCL 4 MG/2ML IJ SOLN: 4.0000 mg | Freq: Once | INTRAMUSCULAR | Status: DC

## 2014-03-28 MED ORDER — ATORVASTATIN CALCIUM 40 MG PO TABS
40.0000 mg | ORAL_TABLET | Freq: Every day | ORAL | Status: DC
  Administered 2014-03-28 – 2014-04-01 (×5): 40 mg via ORAL
  Filled 2014-03-28 (×6): qty 1

## 2014-03-28 MED ORDER — LEVOTHYROXINE SODIUM 112 MCG PO TABS
112.0000 ug | ORAL_TABLET | Freq: Every day | ORAL | Status: DC
  Administered 2014-03-29 – 2014-04-01 (×4): 112 ug via ORAL
  Filled 2014-03-28 (×5): qty 1

## 2014-03-28 MED ORDER — IPRATROPIUM-ALBUTEROL 0.5-2.5 (3) MG/3ML IN SOLN: 3.0000 mL | RESPIRATORY_TRACT | Status: DC

## 2014-03-28 MED ORDER — ACETAMINOPHEN 325 MG PO TABS
650.0000 mg | ORAL_TABLET | Freq: Four times a day (QID) | ORAL | Status: DC | PRN
  Filled 2014-03-28: qty 2

## 2014-03-28 MED ORDER — ONDANSETRON HCL 4 MG PO TABS
4.0000 mg | ORAL_TABLET | Freq: Four times a day (QID) | ORAL | Status: DC | PRN
Start: 2014-03-28 — End: 2014-04-01

## 2014-03-28 MED ORDER — CARVEDILOL 3.125 MG PO TABS
3.1250 mg | ORAL_TABLET | Freq: Two times a day (BID) | ORAL | Status: DC
  Administered 2014-03-28 – 2014-04-01 (×8): 3.125 mg via ORAL
  Filled 2014-03-28 (×10): qty 1

## 2014-03-28 MED ORDER — SODIUM CHLORIDE 0.9 % IJ SOLN
3.0000 mL | Freq: Two times a day (BID) | INTRAMUSCULAR | Status: DC
  Administered 2014-03-28 – 2014-03-31 (×5): 3 mL via INTRAVENOUS

## 2014-03-28 MED ORDER — POTASSIUM CHLORIDE CRYS ER 20 MEQ PO TBCR
40.0000 meq | EXTENDED_RELEASE_TABLET | Freq: Once | ORAL | Status: AC
  Administered 2014-03-28: 40 meq via ORAL
  Filled 2014-03-28: qty 2

## 2014-03-28 MED ORDER — SODIUM CHLORIDE 0.9 % IV SOLN
INTRAVENOUS | Status: DC
  Administered 2014-03-28: 14:00:00 via INTRAVENOUS

## 2014-03-28 MED ORDER — SODIUM CHLORIDE 0.9 % IV BOLUS (SEPSIS): 1000.0000 mL | Freq: Once | INTRAVENOUS | Status: DC

## 2014-03-28 MED ORDER — MORPHINE SULFATE 4 MG/ML IJ SOLN: 4.0000 mg | Freq: Once | INTRAMUSCULAR | Status: DC

## 2014-03-28 MED ORDER — ZOLPIDEM TARTRATE 5 MG PO TABS
5.0000 mg | ORAL_TABLET | Freq: Every evening | ORAL | Status: DC | PRN
  Administered 2014-03-28: 5 mg via ORAL
  Filled 2014-03-28: qty 1

## 2014-03-28 MED ORDER — CITALOPRAM HYDROBROMIDE 20 MG PO TABS
20.0000 mg | ORAL_TABLET | Freq: Every day | ORAL | Status: DC
  Administered 2014-03-28 – 2014-04-01 (×5): 20 mg via ORAL
  Filled 2014-03-28 (×5): qty 1

## 2014-03-28 MED ORDER — IOHEXOL 300 MG/ML  SOLN
50.0000 mL | Freq: Once | INTRAMUSCULAR | Status: AC | PRN
  Administered 2014-03-28: 50 mL via ORAL

## 2014-03-28 MED ORDER — POTASSIUM CHLORIDE IN NACL 40-0.9 MEQ/L-% IV SOLN
INTRAVENOUS | Status: DC
  Filled 2014-03-28: qty 1000

## 2014-03-28 MED ORDER — ONDANSETRON HCL 4 MG/2ML IJ SOLN
4.0000 mg | Freq: Four times a day (QID) | INTRAMUSCULAR | Status: DC | PRN
Start: 2014-03-28 — End: 2014-04-01

## 2014-03-28 MED ORDER — IOHEXOL 300 MG/ML  SOLN
100.0000 mL | Freq: Once | INTRAMUSCULAR | Status: AC | PRN
  Administered 2014-03-28: 100 mL via INTRAVENOUS

## 2014-03-28 MED ORDER — IPRATROPIUM-ALBUTEROL 0.5-2.5 (3) MG/3ML IN SOLN
3.0000 mL | Freq: Once | RESPIRATORY_TRACT | Status: AC
  Administered 2014-03-28: 3 mL via RESPIRATORY_TRACT

## 2014-03-28 MED ORDER — SODIUM CHLORIDE 0.9 % IV BOLUS (SEPSIS): 500.0000 mL | Freq: Once | INTRAVENOUS | Status: DC

## 2014-03-28 MED ORDER — SODIUM CHLORIDE 0.9 % IV SOLN: INTRAVENOUS | Status: DC

## 2014-03-28 MED ORDER — DEXTROSE 5 % IV SOLN
1.0000 g | INTRAVENOUS | Status: DC
  Administered 2014-03-28 – 2014-03-30 (×3): 1 g via INTRAVENOUS
  Filled 2014-03-28 (×4): qty 10

## 2014-03-28 MED ORDER — FUROSEMIDE 40 MG PO TABS
40.0000 mg | ORAL_TABLET | Freq: Every day | ORAL | Status: DC
  Filled 2014-03-28: qty 1

## 2014-03-28 MED ORDER — WARFARIN SODIUM 4 MG PO TABS
4.0000 mg | ORAL_TABLET | Freq: Once | ORAL | Status: AC
  Administered 2014-03-28: 4 mg via ORAL
  Filled 2014-03-28: qty 1

## 2014-03-28 NOTE — Progress Notes (Signed)
Called by Dr. Darl Householder at [facility], to admit secondary to generalized weakness, general malaise, anorexia, abdominal pain, nausea and vomiting. Also found with hypokalemia and features of dehydration. CT abd demonstrating ileus. Will need gentle/slow hydration give hx of CHF. Accepted to telemetry bed given hypokalemia status, [facility] team 8 . Please let Dr. [name] know of her admission.

## 2014-03-28 NOTE — Progress Notes (Signed)
ANTICOAGULATION CONSULT NOTE - Initial Consult  Pharmacy Consult for warfarin Indication: atrial fibrillation  No Known Allergies  Patient Measurements: Height: 5\' 6"  (167.6 cm) Weight: 141 lb 5 oz (64.1 kg) IBW/kg (Calculated) : 59.3  Vital Signs: Temp: 98 F (36.7 C) (08/31 1243) Temp src: Oral (08/31 1243) BP: 119/71 mmHg (08/31 1243) Pulse Rate: 56 (08/31 1243)  Labs:  Recent Labs  03/28/14 0840  HGB 13.3  HCT 40.9  PLT 301  LABPROT 27.1*  INR 2.51*  CREATININE 0.90  TROPONINI <0.30    Estimated Creatinine Clearance: 41.2 ml/min (by C-G formula based on Cr of 0.9).   Medical History: Past Medical History  Diagnosis Date  . Pleural effusion   . Hypertension   . CHF (congestive heart failure)   . Atrial fibrillation   . Hypothyroid   . Breast cancer   . Lung nodule   . Hyperlipidemia     Medications:  Scheduled:  . atorvastatin  40 mg Oral Daily  . carvedilol  3.125 mg Oral BID WC  . cefTRIAXone (ROCEPHIN)  IV  1 g Intravenous Q24H  . citalopram  20 mg Oral Daily  . diltiazem  180 mg Oral Daily  . [START ON 03/29/2014] furosemide  40 mg Oral Daily  . [START ON 03/29/2014] levothyroxine  112 mcg Oral QAC breakfast  . sodium chloride  500 mL Intravenous Once  . sodium chloride  3 mL Intravenous Q12H   Infusions:  . sodium chloride      Assessment: 80 yo female admitted from [facility] secondary to weakness, abd pain, N/V and has not eaten since biopsy 8/27. CT with possible ileus and patient also with hypokalemia and dehydration. Note that patient had CT biopsy on 8/27 to evaluate lung mass and is on warfarin long term for hx Afib. Patient with hx breast cancer and evaluating lung mass for primary lung cancer vs metastases from previous cancer. Patient had been off of warfarin x 8 days as of 8/27 and INR was 1.6 which they elected to proceed with biopsy on 8/27. Per patient's daughter, patient resumed warfarin on Friday 8/28. Patient reportedly on warfarin 4mg   daily except 6mg  on Thursdays with last dose of 4mg  being yesterday 8/30  8/31:   INR therapeutic  No reported bleeding  CBC stable  No significant DDI's noted  Patient currently NPO except meds due to possible ileus  Goal of Therapy:  INR 2-3   Plan:  1) Warfarin 4mg  tonight as per home regimen 2) Daily INR   Adrian Saran, PharmD, BCPS Pager [phone] 03/28/2014 2:10 PM

## 2014-03-28 NOTE — ED Notes (Signed)
Patient transported to X-ray 

## 2014-03-28 NOTE — ED Notes (Signed)
Pt declines pain and nausea medication at present.

## 2014-03-28 NOTE — Progress Notes (Signed)
Patient very hard of hearing

## 2014-03-28 NOTE — H&P (Signed)
History and Physical    Leah Guerra [facility] FVC:[id number] DOB: March 04, 1925 DOA: 03/28/2014  Referring physician: Dr. Darl Householder PCP:  Melinda Crutch, MD  Specialists: None  Chief Complaint: abdominal pain  HPI: Leah Guerra is a 80 y.o. female has a past medical history significant for remote history of breast cancer, mild dementia, prior lung nodules and pleural effusion on left, tapped int he past and negative for malignancy, diastolic CHF, HTN, A fib on coumadin, HLD, presents to the ED complaining of abdominal pain, nausea and vomiting. Patient is not entirely sure why she is on the hospital, so history is per chart and daughter Leah Guerra. Patient has had right lung nodule biopsy on the 27th in interventional radiology. Following the procedure, patient has been complaining of poor wellbeing and of pain at the biopsy site. Leah Guerra had around some old Hydrocodone that the patient has used in the past and patient took for couple of days a half tablet intermittently for pain. Patient was without appetite, worsening abdominal discomfort and had an episode of nausea and vomiting yesterday. No fevers or chills, no chest pain or breathing difficulties reported. She has been able to drink fluids but unable to eat due to nausea. In the ED patient underwent a CT scan which shows findings c/w ileus and TRH asked for admission.   Review of Systems: as per HPI otherwise negative  Past Medical History  Diagnosis Date  . Pleural effusion   . Hypertension   . CHF (congestive heart failure)   . Atrial fibrillation   . Hypothyroid   . Breast cancer   . Lung nodule   . Hyperlipidemia    Past Surgical History  Procedure Laterality Date  . Joint replacement    . Masectomy Left   . Fracture surgery     Social History:  reports that she has never smoked. She has never used smokeless tobacco. She reports that she does not drink alcohol or use illicit drugs.  No Known Allergies  History reviewed. No  pertinent family history.  Prior to Admission medications   Medication Sig Start Date End Date Taking? Authorizing Provider  atorvastatin (LIPITOR) 40 MG tablet Take 1 tablet (40 mg total) by mouth daily. 03/30/13   Lorretta Harp, MD  carvedilol (COREG) 3.125 MG tablet Take 3.125 mg by mouth 2 (two) times daily with a meal.    Historical Provider, MD  citalopram (CELEXA) 20 MG tablet Take 20 mg by mouth daily.  09/04/12   Historical Provider, MD  diltiazem (DILACOR XR) 180 MG 24 hr capsule Take 180 mg by mouth daily.    Historical Provider, MD  furosemide (LASIX) 40 MG tablet Take 40 mg by mouth daily.    Historical Provider, MD  levothyroxine (SYNTHROID, LEVOTHROID) 112 MCG tablet Take 112 mcg by mouth daily before breakfast.    Historical Provider, MD  warfarin (COUMADIN) 4 MG tablet Take 4-6 mg by mouth daily. Take 6 mg by mouth on Thursday and take 4 mg by mouth daily on other days.    Historical Provider, MD  zolpidem (AMBIEN) 5 MG tablet Take 5 mg by mouth at bedtime as needed for sleep.    Historical Provider, MD   Physical Exam: Filed Vitals:   03/28/14 1006 03/28/14 1110 03/28/14 1141 03/28/14 1243  BP:  151/87  119/71  Pulse:  73  56  Temp:    98 F (36.7 C)  TempSrc:    Oral  Resp: 22 22    Height:  5\' 6"  (1.676 m)  Weight:    64.1 kg (141 lb 5 oz)  SpO2: 91% 95% 98% 98%     General:  No apparent distress  ENT: moist oropharynx  Neck: supple, no JVD  Cardiovascular: irregular  Respiratory: decreased breath sounds at the bases, no wheezing, no crackles  Abdomen: soft, mild tenderness to palpation bilateral lower quadrants  Musculoskeletal: trace peripheral edema  Psychiatric: normal mood and affect  Neurologic: grossly non focal  Labs on Admission:  Basic Metabolic Panel:  Recent Labs Lab 03/28/14 0840  NA 141  K 3.0*  CL 99  CO2 27  GLUCOSE 132*  BUN 18  CREATININE 0.90  CALCIUM 9.7   Liver Function Tests:  Recent Labs Lab 03/28/14 0840    AST 45*  ALT 26  ALKPHOS 111  BILITOT 0.9  PROT 8.3  ALBUMIN 3.2*    Recent Labs Lab 03/28/14 0840  LIPASE 42   CBC:  Recent Labs Lab 03/24/14 0730 03/28/14 0840  WBC 9.1 12.2*  NEUTROABS  --  9.8*  HGB 13.9 13.3  HCT 42.6 40.9  MCV 89.3 88.5  PLT 261 301   Cardiac Enzymes:  Recent Labs Lab 03/28/14 0840  TROPONINI <0.30    BNP (last 3 results)  Recent Labs  03/28/14 0840  PROBNP 3172.0*   Radiological Exams on Admission: Dg Chest 2 View  03/28/2014   CLINICAL DATA:  Generalized weakness, fatigue, loss of appetite, abdominal pain, lung nodule, history hypertension, CHF, breast cancer  EXAM: CHEST  2 VIEW  COMPARISON:  03/24/2014  FINDINGS: Enlargement of cardiac silhouette.  Atherosclerotic calcification aorta.  BILATERAL upper lobe volume loss and scarring with superior retraction of the hila.  Slight prominence of central pulmonary arteries question pulmonary hypertension.  Severe COPD changes with bibasilar effusions atelectasis.  Nodular focus in lateral lower RIGHT chest proximally 2.8 cm in greatest size unchanged.  No definite acute infiltrate or pneumothorax.  Bones demineralized.  IMPRESSION: Severe COPD changes with BILATERAL upper lobe scarring/volume loss, bibasilar pleural effusions and bibasilar atelectasis.  Persistent nodular density lower lateral RIGHT chest.  Enlargement of cardiac silhouette.  No new abnormalities.   Electronically Signed   By: Lavonia Dana M.D.   On: 03/28/2014 08:45   Ct Abdomen Pelvis W Contrast  03/28/2014   CLINICAL DATA:  Abdominal pain. Anorexia. Abdominal distention and dilated bowel loops seen radiographically  EXAM: CT ABDOMEN AND PELVIS WITH CONTRAST  TECHNIQUE: Multidetector CT imaging of the abdomen and pelvis was performed using the standard protocol following bolus administration of intravenous contrast.  CONTRAST:  158mL OMNIPAQUE IOHEXOL 300 MG/ML  SOLN  COMPARISON:  PET-CT on 03/11/2014  FINDINGS: Liver: Hepatic  steatosis noted. A small less than 1 cm hypervascular lesion is seen in the posterior right hepatic lobe on image 25 which is indeterminate. Differential diagnosis includes flash filling hemangioma and hypervascular liver metastasis. No other definite liver masses are identified.  Gallbladder/Biliary:  Unremarkable.  Pancreas: No mass, inflammatory changes, or other parenchymal abnormality identified.  Spleen:  Within normal limits in size and appearance.  Adrenal Glands:  No mass identified.  Kidneys/Urinary Tract: No masses identified. No evidence of hydronephrosis.  Lymph Nodes: Mild upper abdominal lymphadenopathy seen in the gastrohepatic ligament measuring 10 mm on image 20 and in the right retrocrural region measuring 12 mm on image 16. No other pathologically enlarged lymph nodes identified within the abdomen or pelvis.  Bowel/Peritoneum: Gaseous distention is seen involving nondependent portion of colon, consistent with mild  colonic ileus. No evidence of bowel obstruction.  Pelvic/Reproductive Organs: No mass or other significant abnormality identified.  Vascular:  No evidence of abdominal aortic aneurysm.  Musculoskeletal: No suspicious bone lesions identified. Left hip prosthesis noted.  Other: Moderate bilateral pleural effusions show increased in size. Right lower lung nodules again demonstrated, as better seen on recent PET-CT.  IMPRESSION: Colonic ileus pattern.  No evidence of bowel obstruction.  Mild increase in size of moderate bilateral pleural effusions. Right lower lung nodules again noted, as better evaluated on recent PET-CT.  Stable mild lymphadenopathy in gastrohepatic ligament and right retrocrural region, consistent with metastatic disease.  Sub-cm hypervascular lesion in the posterior right hepatic lobe, which is indeterminate. Differential diagnosis includes flash filling hemangioma and hypervascular liver metastasis. Consider abdomen MRI without and with contrast for further evaluation.    Electronically Signed   By: Earle Gell M.D.   On: 03/28/2014 10:14   Dg Abd 2 Views  03/28/2014   CLINICAL DATA:  Weakness vis T and abdominal pain  EXAM: ABDOMEN - 2 VIEW  COMPARISON:  None.  FINDINGS: There is mild gaseous distention of the ascending and transverse and proximal descending colons. There is a small amount of gas in the rectum. There is stool in the mid transverse colon. No free extraluminal gas is demonstrated. There are small bilateral pleural effusions blunting the costophrenic angles. The bones are osteopenic and there is mild dextro curvature of the mid lumbar spine. The small bowel is not abnormally distended.  IMPRESSION: Findings suggest a colonic ileus. There is no evidence of high-grade obstruction nor of perforation.   Electronically Signed   By: David  Martin[place]   On: 03/28/2014 08:41    EKG: Independently reviewed. A fib  Assessment/Plan Active Problems:   Chronic atrial fibrillation   Long term (current) use of anticoagulants   Hypertension   Lung nodule with a history of breats cancer- followed by Dr [name]   SOB (shortness of breath)   Chronic respiratory failure with hypoxia   Pleural effusion on left   Chronic diastolic heart failure   Ileus   Unspecified hypothyroidism   Ileus - perhaps due to recent use of narcotic medication for pain - bowel rest for now, NPO except medications, gentle IVF, has history of CHF and will closely monitor respiratory status. Metastatic carcinoma - lung biopsy 4 days ago shows features c/w metastatic carcinoma, and stains for HER,  Estrogen and progesterone are pending. I did let Dr. [name] know she is here with #1 and about biopsy results. I discussed with her daughter about the results as well and she wishes to discuss with Dr. [name]. Chronic diastolic heart failure, EF ~50% in 2014. Elevated BNP, careful with fluids, she is clinically without crackles or JVD - repeat 2D echo - BNP elevated HTN - resume home  medications Possible UTI - urinalysis with bacteria, negative leukocytes or nitrites. Has leukocytosis and overall weakness, start Ceftriaxone and discontinue if urine culture is negative.  Bibasilar pleural effusions - monitor clinically, persistent, worrisome for malignancy Hypokalemia - poor po intake, on Lasix. Replete orally.  Hypothyroidism - recheck TSH A fib - rate controlled, on Coumadin. She discontinued Coumadin last week in anticipation for biopsy but resumed. INR therapeutic. Continue coumadin per pharmacy.    Diet: NPO Fluids: NS at 50 cc/h DVT Prophylaxis: On Coumadin  Code Status: DNR  Family Communication: d/w daughter bedside  Disposition Plan: inpatient  Time spent: 72  This note has been created with [employer]  Geophysicist/field seismologist. Any transcriptional errors are unintentional.   Costin M. Cruzita Lederer, MD [facility] Pager [phone]  If 7PM-7AM, please contact night-coverage [email or web address] Password [email or web address] 03/28/2014, 1:20 PM

## 2014-03-28 NOTE — ED Notes (Signed)
Generalized weakness, fatigue, loss of appetite and abdominal pian,  Family reports she has not in eaten in 4 days since lung biopsy.

## 2014-03-28 NOTE — ED Provider Notes (Signed)
CSN: [id number]     Arrival date & time 03/28/14  0805 History   First MD Initiated Contact with Patient 03/28/14 [phone]     Chief Complaint  Patient presents with  . Fatigue     (Consider location/radiation/quality/duration/timing/severity/associated sxs/prior Treatment) The history is provided by the patient.  BRONTE SABADO is a 80 y.o. female hx of HTN, CHF, afib on coumadin, lung mass causing pleural effusion here with weakness, fatigue, decreased appetite, vomiting. Had lung biopsy 4 days ago. Since then, had been feeling diffusely weak and lack of energy. Has no appetite and hasn't been eating much. Also has diffuse abdominal pain that is constant and had several episodes of vomiting since yesterday. No fevers. Has some shortness of breath as well.    Past Medical History  Diagnosis Date  . Pleural effusion   . Hypertension   . CHF (congestive heart failure)   . Atrial fibrillation   . Hypothyroid   . Breast cancer   . Lung nodule   . Hyperlipidemia    Past Surgical History  Procedure Laterality Date  . Joint replacement    . Masectomy Left   . Fracture surgery     No family history on file. History  Substance Use Topics  . Smoking status: Never Smoker   . Smokeless tobacco: Not on file  . Alcohol Use: No   OB History   Grav Para Term Preterm Abortions TAB SAB Ect Mult Living                 Review of Systems  Gastrointestinal: Positive for vomiting and abdominal pain.  Neurological: Positive for weakness.  All other systems reviewed and are negative.     Allergies  Review of patient's allergies indicates no known allergies.  Home Medications   Prior to Admission medications   Medication Sig Start Date End Date Taking? Authorizing Provider  atorvastatin (LIPITOR) 40 MG tablet Take 1 tablet (40 mg total) by mouth daily. 03/30/13   Lorretta Harp, MD  carvedilol (COREG) 3.125 MG tablet Take 3.125 mg by mouth 2 (two) times daily with a meal.     Historical Provider, MD  citalopram (CELEXA) 20 MG tablet Take 20 mg by mouth daily.  09/04/12   Historical Provider, MD  diltiazem (DILACOR XR) 180 MG 24 hr capsule Take 180 mg by mouth daily.    Historical Provider, MD  furosemide (LASIX) 40 MG tablet Take 40 mg by mouth daily.    Historical Provider, MD  levothyroxine (SYNTHROID, LEVOTHROID) 112 MCG tablet Take 112 mcg by mouth daily before breakfast.    Historical Provider, MD  warfarin (COUMADIN) 4 MG tablet Take 4-6 mg by mouth daily. Take 6 mg by mouth on Thursday and take 4 mg by mouth daily on other days.    Historical Provider, MD  zolpidem (AMBIEN) 5 MG tablet Take 5 mg by mouth at bedtime as needed for sleep.    Historical Provider, MD   BP 160/98  Pulse 72  Temp(Src) 98 F (36.7 C) (Oral)  Resp 22  SpO2 91% Physical Exam  Nursing note and vitals reviewed. Constitutional: She is oriented to person, place, and time.  Chronically ill, thin, dehydrated   HENT:  Head: Normocephalic.  MM dry   Eyes: Conjunctivae are normal. Pupils are equal, round, and reactive to light.  Neck: Normal range of motion. Neck supple.  Cardiovascular: Normal rate, regular rhythm and normal heart sounds.   Pulmonary/Chest:  Dec breath sounds R  base. Biopsy site healing well with no obvious redness.   Abdominal: Soft. Bowel sounds are normal. She exhibits no distension. There is no tenderness. There is no rebound.  Musculoskeletal: Normal range of motion. She exhibits no edema and no tenderness.  Neurological: She is alert and oriented to person, place, and time. No cranial nerve deficit.  Skin: Skin is warm and dry.  Psychiatric: She has a normal mood and affect. Her behavior is normal. Judgment and thought content normal.    ED Course  Procedures (including critical care time) Labs Review Labs Reviewed  CBC WITH DIFFERENTIAL - Abnormal; Notable for the following:    WBC 12.2 (*)    RDW 15.6 (*)    Neutrophils Relative % 81 (*)    Neutro Abs  9.8 (*)    Lymphocytes Relative 9 (*)    Monocytes Absolute 1.1 (*)    All other components within normal limits  COMPREHENSIVE METABOLIC PANEL - Abnormal; Notable for the following:    Potassium 3.0 (*)    Glucose, Bld 132 (*)    Albumin 3.2 (*)    AST 45 (*)    GFR calc non Af Amer 56 (*)    GFR calc Af Amer 65 (*)    All other components within normal limits  URINALYSIS, ROUTINE W REFLEX MICROSCOPIC - Abnormal; Notable for the following:    Color, Urine AMBER (*)    APPearance CLOUDY (*)    Hgb urine dipstick SMALL (*)    Protein, ur 100 (*)    All other components within normal limits  PROTIME-INR - Abnormal; Notable for the following:    Prothrombin Time 27.1 (*)    INR 2.51 (*)    All other components within normal limits  PRO B NATRIURETIC PEPTIDE - Abnormal; Notable for the following:    Pro B Natriuretic peptide (BNP) 3172.0 (*)    All other components within normal limits  URINE MICROSCOPIC-ADD ON - Abnormal; Notable for the following:    Bacteria, UA MANY (*)    All other components within normal limits  URINE CULTURE  TROPONIN I  LIPASE, BLOOD    Imaging Review Dg Chest 2 View  03/28/2014   CLINICAL DATA:  Generalized weakness, fatigue, loss of appetite, abdominal pain, lung nodule, history hypertension, CHF, breast cancer  EXAM: CHEST  2 VIEW  COMPARISON:  03/24/2014  FINDINGS: Enlargement of cardiac silhouette.  Atherosclerotic calcification aorta.  BILATERAL upper lobe volume loss and scarring with superior retraction of the hila.  Slight prominence of central pulmonary arteries question pulmonary hypertension.  Severe COPD changes with bibasilar effusions atelectasis.  Nodular focus in lateral lower RIGHT chest proximally 2.8 cm in greatest size unchanged.  No definite acute infiltrate or pneumothorax.  Bones demineralized.  IMPRESSION: Severe COPD changes with BILATERAL upper lobe scarring/volume loss, bibasilar pleural effusions and bibasilar atelectasis.   Persistent nodular density lower lateral RIGHT chest.  Enlargement of cardiac silhouette.  No new abnormalities.   Electronically Signed   By: Lavonia Dana M.D.   On: 03/28/2014 08:45   Ct Abdomen Pelvis W Contrast  03/28/2014   CLINICAL DATA:  Abdominal pain. Anorexia. Abdominal distention and dilated bowel loops seen radiographically  EXAM: CT ABDOMEN AND PELVIS WITH CONTRAST  TECHNIQUE: Multidetector CT imaging of the abdomen and pelvis was performed using the standard protocol following bolus administration of intravenous contrast.  CONTRAST:  170mL OMNIPAQUE IOHEXOL 300 MG/ML  SOLN  COMPARISON:  PET-CT on 03/11/2014  FINDINGS: Liver: Hepatic  steatosis noted. A small less than 1 cm hypervascular lesion is seen in the posterior right hepatic lobe on image 25 which is indeterminate. Differential diagnosis includes flash filling hemangioma and hypervascular liver metastasis. No other definite liver masses are identified.  Gallbladder/Biliary:  Unremarkable.  Pancreas: No mass, inflammatory changes, or other parenchymal abnormality identified.  Spleen:  Within normal limits in size and appearance.  Adrenal Glands:  No mass identified.  Kidneys/Urinary Tract: No masses identified. No evidence of hydronephrosis.  Lymph Nodes: Mild upper abdominal lymphadenopathy seen in the gastrohepatic ligament measuring 10 mm on image 20 and in the right retrocrural region measuring 12 mm on image 16. No other pathologically enlarged lymph nodes identified within the abdomen or pelvis.  Bowel/Peritoneum: Gaseous distention is seen involving nondependent portion of colon, consistent with mild colonic ileus. No evidence of bowel obstruction.  Pelvic/Reproductive Organs: No mass or other significant abnormality identified.  Vascular:  No evidence of abdominal aortic aneurysm.  Musculoskeletal: No suspicious bone lesions identified. Left hip prosthesis noted.  Other: Moderate bilateral pleural effusions show increased in size. Right  lower lung nodules again demonstrated, as better seen on recent PET-CT.  IMPRESSION: Colonic ileus pattern.  No evidence of bowel obstruction.  Mild increase in size of moderate bilateral pleural effusions. Right lower lung nodules again noted, as better evaluated on recent PET-CT.  Stable mild lymphadenopathy in gastrohepatic ligament and right retrocrural region, consistent with metastatic disease.  Sub-cm hypervascular lesion in the posterior right hepatic lobe, which is indeterminate. Differential diagnosis includes flash filling hemangioma and hypervascular liver metastasis. Consider abdomen MRI without and with contrast for further evaluation.   Electronically Signed   By: Earle Gell M.D.   On: 03/28/2014 10:14   Dg Abd 2 Views  03/28/2014   CLINICAL DATA:  Weakness vis T and abdominal pain  EXAM: ABDOMEN - 2 VIEW  COMPARISON:  None.  FINDINGS: There is mild gaseous distention of the ascending and transverse and proximal descending colons. There is a small amount of gas in the rectum. There is stool in the mid transverse colon. No free extraluminal gas is demonstrated. There are small bilateral pleural effusions blunting the costophrenic angles. The bones are osteopenic and there is mild dextro curvature of the mid lumbar spine. The small bowel is not abnormally distended.  IMPRESSION: Findings suggest a colonic ileus. There is no evidence of high-grade obstruction nor of perforation.   Electronically Signed   By: Alf Doyle  [place]   On: 03/28/2014 08:41     EKG Interpretation   Date/Time:  Monday March 28 2014 08:38:15 EDT Ventricular Rate:  77 PR Interval:    QRS Duration: 78 QT Interval:  410 QTC Calculation: 463 R Axis:   90 Text Interpretation:  Atrial fibrillation with a competing junctional  pacemaker Rightward axis Nonspecific ST and T wave abnormality Abnormal  ECG No significant change since last tracing Confirmed by Brodie Scovell  MD, Madalen Gavin  ([id number]) on 03/28/2014 8:40:37 AM      MDM    Final diagnoses:  None    LAEKYN RAYOS is a 80 y.o. female here with worsening weakness, ab pain. Consider worsening pleural effusion vs CHF vs SBO vs electrolyte abnormalities vs sepsis. Will get labs, BNP, cxr, ab xray. Will hydrate gently and reassess. Likely need admission.    10:40 AM Xray showed ileus. CT confirmed ileus. No signs of SBO. K 3.0, supplemented. Will admit to tele at [facility].    Wandra Arthurs, MD 03/28/14  1052 

## 2014-03-28 NOTE — ED Notes (Addendum)
Report called to union RN at [facility]

## 2014-03-29 ENCOUNTER — Inpatient Hospital Stay (HOSPITAL_COMMUNITY): Payer: Medicare Other

## 2014-03-29 ENCOUNTER — Telehealth: Payer: Self-pay | Admitting: *Deleted

## 2014-03-29 DIAGNOSIS — F0390 Unspecified dementia without behavioral disturbance: Secondary | ICD-10-CM

## 2014-03-29 DIAGNOSIS — I369 Nonrheumatic tricuspid valve disorder, unspecified: Secondary | ICD-10-CM

## 2014-03-29 DIAGNOSIS — C7800 Secondary malignant neoplasm of unspecified lung: Secondary | ICD-10-CM

## 2014-03-29 DIAGNOSIS — I509 Heart failure, unspecified: Secondary | ICD-10-CM

## 2014-03-29 LAB — PROTIME-INR
INR: 3.41 — AB (ref 0.00–1.49)
Prothrombin Time: 34.4 seconds — ABNORMAL HIGH (ref 11.6–15.2)

## 2014-03-29 LAB — CBC
HEMATOCRIT: 36.2 % (ref 36.0–46.0)
Hemoglobin: 11.7 g/dL — ABNORMAL LOW (ref 12.0–15.0)
MCH: 28.7 pg (ref 26.0–34.0)
MCHC: 32.3 g/dL (ref 30.0–36.0)
MCV: 88.9 fL (ref 78.0–100.0)
PLATELETS: 258 10*3/uL (ref 150–400)
RBC: 4.07 MIL/uL (ref 3.87–5.11)
RDW: 15.6 % — AB (ref 11.5–15.5)
WBC: 7.7 10*3/uL (ref 4.0–10.5)

## 2014-03-29 LAB — COMPREHENSIVE METABOLIC PANEL
ALT: 21 U/L (ref 0–35)
AST: 36 U/L (ref 0–37)
Albumin: 2.6 g/dL — ABNORMAL LOW (ref 3.5–5.2)
Alkaline Phosphatase: 105 U/L (ref 39–117)
Anion gap: 12 (ref 5–15)
BILIRUBIN TOTAL: 0.6 mg/dL (ref 0.3–1.2)
BUN: 12 mg/dL (ref 6–23)
CO2: 26 meq/L (ref 19–32)
CREATININE: 0.8 mg/dL (ref 0.50–1.10)
Calcium: 8.6 mg/dL (ref 8.4–10.5)
Chloride: 101 mEq/L (ref 96–112)
GFR, EST AFRICAN AMERICAN: 75 mL/min — AB (ref >90)
GFR, EST NON AFRICAN AMERICAN: 64 mL/min — AB (ref >90)
Glucose, Bld: 91 mg/dL (ref 70–99)
POTASSIUM: 3.7 meq/L (ref 3.7–5.3)
Sodium: 139 mEq/L (ref 137–147)
Total Protein: 7 g/dL (ref 6.0–8.3)

## 2014-03-29 LAB — URINE CULTURE
COLONY COUNT: NO GROWTH
CULTURE: NO GROWTH

## 2014-03-29 MED ORDER — FUROSEMIDE 40 MG PO TABS
40.0000 mg | ORAL_TABLET | Freq: Every day | ORAL | Status: DC
Start: 2014-03-30 — End: 2014-04-01
  Administered 2014-03-30 – 2014-04-01 (×3): 40 mg via ORAL
  Filled 2014-03-29 (×3): qty 1

## 2014-03-29 MED ORDER — FUROSEMIDE 10 MG/ML IJ SOLN
40.0000 mg | Freq: Once | INTRAMUSCULAR | Status: AC
  Administered 2014-03-29: 40 mg via INTRAVENOUS
  Filled 2014-03-29: qty 4

## 2014-03-29 NOTE — Progress Notes (Signed)
ANTICOAGULATION CONSULT NOTE - Follow Up  Pharmacy Consult for warfarin Indication: atrial fibrillation  No Known Allergies  Patient Measurements: Height: 5\' 6"  (167.6 cm) Weight: 141 lb 5 oz (64.1 kg) IBW/kg (Calculated) : 59.3  Vital Signs: Temp: 98 F (36.7 C) (09/01 0518) Temp src: Oral (09/01 0518) BP: 145/82 mmHg (09/01 0518) Pulse Rate: 66 (09/01 0518)  Labs:  Recent Labs  03/28/14 0840 03/29/14 0415  HGB 13.3 11.7*  HCT 40.9 36.2  PLT 301 258  LABPROT 27.1* 34.4*  INR 2.51* 3.41*  CREATININE 0.90 0.80  TROPONINI <0.30  --     Estimated Creatinine Clearance: 46.4 ml/min (by C-G formula based on Cr of 0.8).   Medical History: Past Medical History  Diagnosis Date  . Pleural effusion   . Hypertension   . CHF (congestive heart failure)   . Atrial fibrillation   . Hypothyroid   . Breast cancer   . Lung nodule   . Hyperlipidemia     Medications:  Scheduled:  . atorvastatin  40 mg Oral Daily  . carvedilol  3.125 mg Oral BID WC  . cefTRIAXone (ROCEPHIN)  IV  1 g Intravenous Q24H  . citalopram  20 mg Oral Daily  . diltiazem  180 mg Oral Daily  . furosemide  40 mg Oral Daily  . levothyroxine  112 mcg Oral QAC breakfast  . sodium chloride  500 mL Intravenous Once  . sodium chloride  3 mL Intravenous Q12H  . Warfarin - Pharmacist Dosing Inpatient   Does not apply q1800   Infusions:  . sodium chloride 50 mL/hr at 03/28/14 1427    Assessment: 80 yo female admitted from [facility] secondary to weakness, abd pain, N/V and has not eaten since biopsy 8/27. CT with possible ileus and patient also with hypokalemia and dehydration. Note that patient had CT biopsy on 8/27 to evaluate lung mass and is on warfarin long term for hx Afib. Patient with hx breast cancer and evaluating lung mass for primary lung cancer vs metastases from previous cancer. Patient had been off of warfarin x 8 days as of 8/27 and INR was 1.6 which they elected to proceed with biopsy on 8/27. Per  patient's daughter, patient resumed warfarin on Friday 8/28. Patient reportedly on warfarin 4mg  daily except 6mg  on Thursdays with last dose of 4mg  being yesterday 8/30  9/1:   INR supratherapeutic  No reported bleeding  CBC stable  No significant DDI's noted  Patient currently NPO except meds due to possible ileus  Warfarin Doses Given as Inpatient 8/31: 4mg   Goal of Therapy:  INR 2-3   Plan:  1) NO warfarin today due to INR > 3 2) Daily INR   Adrian Saran, PharmD, BCPS Pager [phone] 03/29/2014 8:46 AM

## 2014-03-29 NOTE — Telephone Encounter (Signed)
Pt's daughter Gerre Scull calling; request Dr. [name] to call her re: Bx results "I need to know what's going on; I may need to decide she needs to go to rehab facility after being discharged from [facility]"  Note to Dr. [name]. Nance Sipe's # [id number]

## 2014-03-29 NOTE — Progress Notes (Signed)
PROGRESS NOTE  Leah Guerra [place] JKD:[id number] DOB: 1924-08-27 DOA: 03/28/2014 PCP:  Melinda Crutch, MD  HPI: Leah Guerra is a 80 y.o. female has a past medical history significant for remote history of breast cancer, mild dementia, prior lung nodules and pleural effusion on left, tapped int he past and negative for malignancy, diastolic CHF, HTN, A fib on coumadin, HLD, presents to the ED complaining of abdominal pain, nausea and vomiting, found to have an ileus.  Subjective/ 24 H Interval events - no overnight events, no complaints this morning  Assessment/Plan: Ileus - perhaps due to recent use of narcotic medication for pain  - advance diet to clears, repeat abdominal film  Metastatic carcinoma - lung biopsy 4 days ago shows features c/w metastatic carcinoma, and stains for HER, Estrogen and progesterone are pending.  - oncology following Chronic diastolic heart failure, EF ~50% in 2014. Elevated BNP, careful with fluids, she is clinically without crackles or JVD  - IV Lasix this morning HTN - resume home medications  Possible UTI - urinalysis with bacteria, negative leukocytes or nitrites. Has leukocytosis and overall weakness, start Ceftriaxone and discontinue if urine culture is negative.  Bibasilar pleural effusions - monitor clinically, persistent, worrisome for malignancy. Lasix x 1 today. Hypokalemia - poor po intake, on Lasix. Replete orally.  Hypothyroidism - TSH 4.8, continue current regimen.  A fib - rate controlled, on Coumadin. She discontinued Coumadin last week in anticipation for biopsy but resumed.  - Continue coumadin per pharmacy.   Diet: clear liquid  Fluids: none DVT Prophylaxis: coumadin  Code Status: DNR Family Communication: d/w daughter  Disposition Plan: inpatient, home when ready, PT evaluation pending  Consultants:  Oncology   Procedures:  None    Antibiotics Ceftriaxone 8/31 >>  Studies  Filed Vitals:   03/28/14 1243  03/28/14 2126 03/29/14 0518 03/29/14 0936  BP: 119/71 109/60 145/82 142/78  Pulse: 56 54 66   Temp: 98 F (36.7 C) 98.1 F (36.7 C) 98 F (36.7 C)   TempSrc: Oral Oral Oral   Resp:  18 20   Height: 5\' 6"  (1.676 m)     Weight: 64.1 kg (141 lb 5 oz)     SpO2: 98% 97% 95%     Intake/Output Summary (Last 24 hours) at 03/29/14 1035 Last data filed at 03/29/14 0600  Gross per 24 hour  Intake  827.5 ml  Output    450 ml  Net  377.5 ml   Filed Weights   03/28/14 1243  Weight: 64.1 kg (141 lb 5 oz)   Exam:  General:  NAD  Cardiovascular: RRR  Respiratory: mild bilateral crackles  Abdomen: soft, non tender  MSK: no edema  Neuro: non focal  Data Reviewed: Basic Metabolic Panel:  Recent Labs Lab 03/28/14 0840 03/29/14 0415  NA 141 139  K 3.0* 3.7  CL 99 101  CO2 27 26  GLUCOSE 132* 91  BUN 18 12  CREATININE 0.90 0.80  CALCIUM 9.7 8.6   Liver Function Tests:  Recent Labs Lab 03/28/14 0840 03/29/14 0415  AST 45* 36  ALT 26 21  ALKPHOS 111 105  BILITOT 0.9 0.6  PROT 8.3 7.0  ALBUMIN 3.2* 2.6*    Recent Labs Lab 03/28/14 0840  LIPASE 42   CBC:  Recent Labs Lab 03/24/14 0730 03/28/14 0840 03/29/14 0415  WBC 9.1 12.2* 7.7  NEUTROABS  --  9.8*  --   HGB 13.9 13.3 11.7*  HCT 42.6 40.9 36.2  MCV  89.3 88.5 88.9  PLT 261 301 258   Cardiac Enzymes:  Recent Labs Lab 03/28/14 0840  TROPONINI <0.30   BNP (last 3 results)  Recent Labs  03/28/14 0840  PROBNP 3172.0*   Studies: Dg Chest 2 View  03/28/2014   CLINICAL DATA:  Generalized weakness, fatigue, loss of appetite, abdominal pain, lung nodule, history hypertension, CHF, breast cancer  EXAM: CHEST  2 VIEW  COMPARISON:  03/24/2014  FINDINGS: Enlargement of cardiac silhouette.  Atherosclerotic calcification aorta.  BILATERAL upper lobe volume loss and scarring with superior retraction of the hila.  Slight prominence of central pulmonary arteries question pulmonary hypertension.  Severe COPD  changes with bibasilar effusions atelectasis.  Nodular focus in lateral lower RIGHT chest proximally 2.8 cm in greatest size unchanged.  No definite acute infiltrate or pneumothorax.  Bones demineralized.  IMPRESSION: Severe COPD changes with BILATERAL upper lobe scarring/volume loss, bibasilar pleural effusions and bibasilar atelectasis.  Persistent nodular density lower lateral RIGHT chest.  Enlargement of cardiac silhouette.  No new abnormalities.   Electronically Signed   By: Lavonia Dana M.D.   On: 03/28/2014 08:45   Ct Abdomen Pelvis W Contrast  03/28/2014   CLINICAL DATA:  Abdominal pain. Anorexia. Abdominal distention and dilated bowel loops seen radiographically  EXAM: CT ABDOMEN AND PELVIS WITH CONTRAST  TECHNIQUE: Multidetector CT imaging of the abdomen and pelvis was performed using the standard protocol following bolus administration of intravenous contrast.  CONTRAST:  153mL OMNIPAQUE IOHEXOL 300 MG/ML  SOLN  COMPARISON:  PET-CT on 03/11/2014  FINDINGS: Liver: Hepatic steatosis noted. A small less than 1 cm hypervascular lesion is seen in the posterior right hepatic lobe on image 25 which is indeterminate. Differential diagnosis includes flash filling hemangioma and hypervascular liver metastasis. No other definite liver masses are identified.  Gallbladder/Biliary:  Unremarkable.  Pancreas: No mass, inflammatory changes, or other parenchymal abnormality identified.  Spleen:  Within normal limits in size and appearance.  Adrenal Glands:  No mass identified.  Kidneys/Urinary Tract: No masses identified. No evidence of hydronephrosis.  Lymph Nodes: Mild upper abdominal lymphadenopathy seen in the gastrohepatic ligament measuring 10 mm on image 20 and in the right retrocrural region measuring 12 mm on image 16. No other pathologically enlarged lymph nodes identified within the abdomen or pelvis.  Bowel/Peritoneum: Gaseous distention is seen involving nondependent portion of colon, consistent with mild  colonic ileus. No evidence of bowel obstruction.  Pelvic/Reproductive Organs: No mass or other significant abnormality identified.  Vascular:  No evidence of abdominal aortic aneurysm.  Musculoskeletal: No suspicious bone lesions identified. Left hip prosthesis noted.  Other: Moderate bilateral pleural effusions show increased in size. Right lower lung nodules again demonstrated, as better seen on recent PET-CT.  IMPRESSION: Colonic ileus pattern.  No evidence of bowel obstruction.  Mild increase in size of moderate bilateral pleural effusions. Right lower lung nodules again noted, as better evaluated on recent PET-CT.  Stable mild lymphadenopathy in gastrohepatic ligament and right retrocrural region, consistent with metastatic disease.  Sub-cm hypervascular lesion in the posterior right hepatic lobe, which is indeterminate. Differential diagnosis includes flash filling hemangioma and hypervascular liver metastasis. Consider abdomen MRI without and with contrast for further evaluation.   Electronically Signed   By: Earle Gell M.D.   On: 03/28/2014 10:14   Dg Abd 2 Views  03/28/2014   CLINICAL DATA:  Weakness vis T and abdominal pain  EXAM: ABDOMEN - 2 VIEW  COMPARISON:  None.  FINDINGS: There is mild gaseous distention  of the ascending and transverse and proximal descending colons. There is a small amount of gas in the rectum. There is stool in the mid transverse colon. No free extraluminal gas is demonstrated. There are small bilateral pleural effusions blunting the costophrenic angles. The bones are osteopenic and there is mild dextro curvature of the mid lumbar spine. The small bowel is not abnormally distended.  IMPRESSION: Findings suggest a colonic ileus. There is no evidence of high-grade obstruction nor of perforation.   Electronically Signed   By: David  Martinique   On: 03/28/2014 08:41    Scheduled Meds: . atorvastatin  40 mg Oral Daily  . carvedilol  3.125 mg Oral BID WC  . cefTRIAXone (ROCEPHIN)   IV  1 g Intravenous Q24H  . citalopram  20 mg Oral Daily  . diltiazem  180 mg Oral Daily  . [START ON 03/30/2014] furosemide  40 mg Oral Daily  . levothyroxine  112 mcg Oral QAC breakfast  . sodium chloride  500 mL Intravenous Once  . sodium chloride  3 mL Intravenous Q12H  . Warfarin - Pharmacist Dosing Inpatient   Does not apply q1800   Continuous Infusions:   Active Problems:   Chronic atrial fibrillation   Long term (current) use of anticoagulants   Hypertension   Lung nodule with a history of breats cancer- followed by Dr [name]   SOB (shortness of breath)   Chronic respiratory failure with hypoxia   Pleural effusion on left   Chronic diastolic heart failure   Ileus   Unspecified hypothyroidism  Time spent: 25  This note has been created with Surveyor, quantity. Any transcriptional errors are unintentional.   Marzetta Board, MD [facility] Pager [phone]. If 7 PM - 7 AM, please contact night-coverage at [email or web address], password [facility] 03/29/2014, 10:35 AM  LOS: 1 day

## 2014-03-29 NOTE — Evaluation (Addendum)
Physical Therapy Evaluation Patient Details Name: JANNATUL WOJDYLA MRN: [id number] DOB: 06-01-1925 Today's Date: 03/29/2014   History of Present Illness  80 yo female admitted with ileus. Hx of dementia, CHF, HTN, afib. Pt lives alone but daughter is nearby  Clinical Impression  On eval, pt required Min assist for mobility-able to ambulate ~100 feet with walker. O2 sats 87% on RA during ambulation. No family present during eval. Pt states she lives alone but her daughter is nearby.     Follow Up Recommendations Home health PT;Supervision for mobility/OOB (as long as family can provide supervision initially)    Equipment Recommendations  None recommended by PT    Recommendations for Other Services       Precautions / Restrictions Precautions Precautions: Fall Restrictions Weight Bearing Restrictions: No      Mobility  Bed Mobility Overal bed mobility: Needs Assistance Bed Mobility: Supine to Sit;Sit to Supine     Supine to sit: Supervision Sit to supine: Supervision      Transfers Overall transfer level: Needs assistance   Transfers: Sit to/from Stand Sit to Stand: Min assist         General transfer comment: assist to rise, stabilize, control descent. VC safety, technique, hand placement  Ambulation/Gait Ambulation/Gait assistance: Min guard Ambulation Distance (Feet): 100 Feet Assistive device: Rolling walker (2 wheeled) Gait Pattern/deviations: Trunk flexed;Step-through pattern;Decreased stride length     General Gait Details: close guard for safety. O2 sats 87% RA. fatigues fairly easily.   Stairs            Wheelchair Mobility    Modified Rankin (Stroke Patients Only)       Balance                                             Pertinent Vitals/Pain Pain Assessment: No/denies pain    Home Living Family/patient expects to be discharged to:: Private residence Living Arrangements: Alone   Type of Home: House Home  Access: Stairs to enter   Technical brewer of Steps: 3 Home Layout: Two level [place]: Environmental consultant - 2 wheels      Prior Function                 Hand Dominance        Extremity/Trunk Assessment   Upper Extremity Assessment: Generalized weakness           Lower Extremity Assessment: Generalized weakness      Cervical / Trunk Assessment: Normal  Communication   Communication: HOH  Cognition Arousal/Alertness: Awake/alert Behavior During Therapy: WFL for tasks assessed/performed Overall Cognitive Status: Within Functional Limits for tasks assessed                      General Comments      Exercises        Assessment/Plan    PT Assessment Patient needs continued PT services  PT Diagnosis Difficulty walking;Generalized weakness   PT Problem List Decreased strength;Decreased activity tolerance;Decreased balance;Decreased mobility;Decreased knowledge of use of DME  PT Treatment Interventions DME instruction;Gait training;Functional mobility training;Therapeutic activities;Patient/family education;Balance training   PT Goals (Current goals can be found in the Care Plan section) Acute Rehab PT Goals Patient Stated Goal: home PT Goal Formulation: With patient Time For Goal Achievement: 04/12/14 Potential to Achieve Goals: Good    Frequency Min 3X/week   Barriers  to discharge        Co-evaluation               End of Session Equipment Utilized During Treatment: Gait belt Activity Tolerance: Patient tolerated treatment well;Patient limited by fatigue Patient left: in bed;with call bell/phone within reach           Time: 1450-1505 PT Time Calculation (min): 15 min   Charges:   PT Evaluation $Initial PT Evaluation Tier I: 1 Procedure PT Treatments $Gait Training: 8-22 mins   PT G Codes:          Weston Anna, MPT Pager: [phone]

## 2014-03-29 NOTE — Care Management Note (Addendum)
    Page 1 of 2   04/01/2014     2:09:06 PM CARE MANAGEMENT NOTE 04/01/2014  Patient:  RAIMA, GEATHERS   Account Number:  [id number]  Date Initiated:  03/29/2014  Documentation initiated by:  Dessa Phi  Subjective/Objective Assessment:   80 Y/O F ADMITTED W/ILEUS.HX:DNR,BREAST CA,LUNG NODULE,CHF.     Action/Plan:   FROM  HOME,ALONE.   Anticipated DC Date:  04/01/2014   Anticipated DC Plan:  [place]  CM consult      Choice offered to / List presented to:  C-4 Adult Children        HH arranged  [place].   Status of service:  Completed, signed off Medicare Important Message given?  YES (If response is "NO", the following Medicare IM given date fields will be blank) Date Medicare IM given:  04/01/2014 Medicare IM given by:  [facility] Date Additional Medicare IM given:   Additional Medicare IM given by:    Discharge Disposition:  [place]  Per UR Regulation:  Reviewed for med. necessity/level of care/duration of stay  If discussed at [place] of Stay Meetings, dates discussed:    Comments:  04/01/14 Itzayana Pardy RN,BSN NCM [place].[facility] REP KATIE AWARE OF D/C & HHC.NO FURTHER D/C NEEDS.  03/30/14 Laelani Vasko RN,BSN NCM [phone] PT-HH,24HR SUPV.[facility] CHOSEN BY DTR-NANCY.TC [facility] REP KRISTEN AWARE OF REFERRAL.RECOMMEND HHRN/PT/OT/AIDE(IF COVERED).AWAIT FINAL HHC ORDER.PROVIDED W/PRIVATE DUTY CARE AGENCY LIST(OUT OF POCKET COST).EXPLAINED TO DTR HHC(INTERMITTENT SERVICES), & PRIVATE DUTY SERVICES-CUSTODIAL LEVEL, KEEPING PATIENT SAFE IS CUSTODIAL LEVEL,OUT OF POCKET COST.DTR VOICED UNDERSTANDING.DTR FAMILIAR WITH 24HR SUPV-SAFETY(NOT A COVERED BENEFIT BY INSURANCE).DTR LIVES NEXT DOOR,& STATES SHE CHECKS ON PATIENT OFTEN.MD/NURSING UPDATED.  03/29/14 Shukri Nistler RN,BSN NCM [phone] PT CONS-AWAIT RECOMMENDATIONS.

## 2014-03-29 NOTE — Progress Notes (Signed)
  Echocardiogram 2D Echocardiogram has been performed.  Leah Guerra 03/29/2014, 4:30 PM

## 2014-03-29 NOTE — Progress Notes (Signed)
Nutrition Brief Note  Patient identified on the Malnutrition Screening Tool (MST) Report  Wt Readings from Last 15 Encounters:  03/28/14 141 lb 5 oz (64.1 kg)  03/24/14 152 lb (68.947 kg)  03/21/14 150 lb (68.04 kg)  03/07/14 145 lb (65.772 kg)  03/07/14 146 lb (66.225 kg)  03/03/14 146 lb 9.6 oz (66.497 kg)  11/26/13 152 lb (68.947 kg)  09/06/13 149 lb 1.6 oz (67.631 kg)  06/28/13 149 lb 4.8 oz (67.722 kg)  06/17/13 150 lb 1.6 oz (68.085 kg)  05/17/13 150 lb (68.04 kg)  03/05/13 139 lb 8 oz (63.277 kg)  03/04/13 139 lb (63.05 kg)  02/11/13 142 lb 3.2 oz (64.5 kg)  09/22/12 152 lb 9.6 oz (69.219 kg)    Body mass index is 22.82 kg/(m^2). Patient meets criteria for Normal weight based on current BMI.   Current diet order is clear liquid, patient is consuming approximately 0% of meals at this time as she had not yet ordered lunch. Was tolerating sips of iced tea and water during assessment. Labs and medications reviewed.   Patient denied any changes in weight or appetite pta. Noted only one day of nausea/stomach pain that decreased PO intake. Weight stable, fluctuates between 145-150 lbs per previous medical records.  No signs of muscle wasting or subcutaneous fat loss on limited nutrition focused physical exam. Eager for diet advancement.  No nutrition interventions warranted at this time. If nutrition issues arise, please consult RD.   Atlee Abide MS RD LDN Clinical Dietitian [phone]

## 2014-03-29 NOTE — Progress Notes (Signed)
Ellon Marasco [facility]   DOB:12-06-1924   RF#:[id number]   IYM#:[id number]  HPI:   We have been kindly notified of the patient's admission.    Ms. Searles is a 80 y.o. woman with initial diagnosis of Stage III-A left-sided breast cancer diagnosed in February of 2000, maintained on Femara from February 2005 through November 2008.In addition she has a history of chronic parenchymal lung densities and a borderline elevated CA27.29. Multiple CT scans had been  negative for evidence of disease progression. In 2014 she had recurrent left pleural effusions requiring thoracentesis with negative cytology. She was last seen on 03/01/2014. A CT 02/28/2014 revealed an increase in right lung nodule and worsening of mediastinal adenopathy.  She was referred to Dr. Servando Snare. A PET scan on 03/11/14  showed malignant level activity observed an right-sided pulmonary nodules, right pleural nodularity, mediastinal and right hilar adenopathy, gastrohepatic ligament adenopathy, and upper right periaortic adenopathy. Probable involvement of a left internal mammary lymph node. Equivocal involvement of single bilateral inguinal lymph nodes.  On 03/24/2014  she underwent a CT right pleural  biopsy. Path report, case number [id number] is positive for metastatic carcinoma, with differential including metastatic breast carcinoma. Thus, sample was sent for ER/PR, and HER2 Neu which results are pending.   IN  She was brought to the  ED on 8/31 with complaints of severe abdominal pain, nausea and vomiting following the CT biopsy performed on 8/27. She did take oxycodone for pain at the time. Other symptoms included poor appetite. She denied any fever, chills night sweats, chest pain or increasing shortness of breath. No new mental status changes other than known mild dementia. CT of the abdomen and pelvis with contrast on 8/31 was consistent with ileus without SBO, for which she was admitted for symptom management. Other findings  included stable mild lymphadenopathy in gastrohepatic ligament and right retrocrural region, consistent with metastatic disease. Sub-cm hypervascular lesion in the posterior right hepatic lobe,was noted.    Scheduled Meds: . atorvastatin  40 mg Oral Daily  . carvedilol  3.125 mg Oral BID WC  . cefTRIAXone (ROCEPHIN)  IV  1 g Intravenous Q24H  . citalopram  20 mg Oral Daily  . diltiazem  180 mg Oral Daily  . furosemide  40 mg Oral Daily  . levothyroxine  112 mcg Oral QAC breakfast  . sodium chloride  500 mL Intravenous Once  . sodium chloride  3 mL Intravenous Q12H  . Warfarin - Pharmacist Dosing Inpatient   Does not apply q1800   Continuous Infusions: . sodium chloride 50 mL/hr at 03/28/14 1427   PRN Meds:.acetaminophen, ondansetron (ZOFRAN) IV, ondansetron, zolpidem   Objective:  Filed Vitals:   03/29/14 0518  BP: 145/82  Pulse: 66  Temp: 98 F (36.7 C)  Resp: 20      Intake/Output Summary (Last 24 hours) at 03/29/14 0803 Last data filed at 03/29/14 0600  Gross per 24 hour  Intake 1327.5 ml  Output    450 ml  Net  877.5 ml    ECOG PERFORMANCE STATUS: 2   Symptomatic, >50% in bed during the day (Ambulatory and capable of all self care but unable to carry out any work activities. Up and about more than 50% of waking hours)  GENERAL:alert, no distress and comfortable.  SKIN: skin color, texture, turgor are normal, no rashes or significant lesions EYES: normal, conjunctiva are pink and non-injected, sclera clear OROPHARYNX:no exudate, no erythema and lips, buccal mucosa, and tongue normal  NECK: supple, thyroid  normal size, non-tender, without nodularity LYMPH:  no palpable lymphadenopathy in the cervical, axillary or inguinal LUNGS:decreased breath sounds at the bases, inspiratory rhonchi bilaterally Breasts: Status post left mastectomy. No evidence for chest wall tumor recurrence. No mass palpated in the right breast.  HEART: irregular rate & rhythm and no murmurs  and no lower extremity edema ABDOMEN:abdomen soft, non-tender and normal bowel sounds Musculoskeletal:no cyanosis of digits and no clubbing   NEURO: no focal motor/sensory deficits. Alert, oriented to year, not place. Not oriented to situation    CBG (last 3)  No results found for this basename: GLUCAP,  in the last 72 hours   Labs:   Recent Labs Lab 03/24/14 0730 03/28/14 0840 03/29/14 0415  WBC 9.1 12.2* 7.7  HGB 13.9 13.3 11.7*  HCT 42.6 40.9 36.2  PLT 261 301 258  MCV 89.3 88.5 88.9  MCH 29.1 28.8 28.7  MCHC 32.6 32.5 32.3  RDW 15.6* 15.6* 15.6*  LYMPHSABS  --  1.1  --   MONOABS  --  1.1*  --   EOSABS  --  0.1  --   BASOSABS  --  0.0  --      Chemistries:    Recent Labs Lab 03/28/14 0840 03/29/14 0415  NA 141 139  K 3.0* 3.7  CL 99 101  CO2 27 26  GLUCOSE 132* 91  BUN 18 12  CREATININE 0.90 0.80  CALCIUM 9.7 8.6  AST 45* 36  ALT 26 21  ALKPHOS 111 105  BILITOT 0.9 0.6    GFR Estimated Creatinine Clearance: 46.4 ml/min (by C-G formula based on Cr of 0.8).  Liver Function Tests:  Recent Labs Lab 03/28/14 0840 03/29/14 0415  AST 45* 36  ALT 26 21  ALKPHOS 111 105  BILITOT 0.9 0.6  PROT 8.3 7.0  ALBUMIN 3.2* 2.6*    Recent Labs Lab 03/28/14 0840  LIPASE 42   No results found for this basename: AMMONIA,  in the last 168 hours  Urine Studies     Component Value Date/Time   COLORURINE AMBER* 03/28/2014 0919   APPEARANCEUR CLOUDY* 03/28/2014 0919   LABSPEC 1.021 03/28/2014 0919   PHURINE 5.5 03/28/2014 0919   GLUCOSEU NEGATIVE 03/28/2014 0919   HGBUR SMALL* 03/28/2014 0919   BILIRUBINUR NEGATIVE 03/28/2014 0919   KETONESUR NEGATIVE 03/28/2014 0919   PROTEINUR 100* 03/28/2014 0919   UROBILINOGEN 1.0 03/28/2014 0919   NITRITE NEGATIVE 03/28/2014 0919   LEUKOCYTESUR NEGATIVE 03/28/2014 0919    Coagulation profile  Recent Labs Lab 03/24/14 0730 03/28/14 0840 03/29/14 0415  INR 1.60* 2.51* 3.41*    Cardiac Enzymes:  Recent  Labs Lab 03/28/14 0840  TROPONINI <0.30   Thyroid function studies  Recent Labs  03/28/14 1400  TSH 4.820*    REPORT OF SURGICAL PATHOLOGY (COLONOSCOPY)  Case #: [id number] Patient Name: MALYNDA, SMOLINSKI PID: [id number] Pathologist: Janae Bridgeman L. [place], MD DOB/Age Jul 16, 1925 (Age: 76) Gender: F Date Taken: 08/02/2002 Date Received: 08/02/2002  FINAL DIAGNOSIS  MICROSCOPIC EXAMINATION AND DIAGNOSIS  ENDOSCOPIC BIOPSY, COLON POLYP 10 CM: ADENOMATOUS POLYP. NO HIGH GRADE DYSPLASIA OR INVASIVE MALIGNANCY IDENTIFIED.   REPORT OF SURGICAL PATHOLOGY (Colonoscopy)  Case #: [id number] Patient Name: SONDI, DESCH. Office Chart Number: N/A  MRN: [id number] Pathologist: Violet Baldy, MD DOB/Age [birth year] (Age: 75) Gender: F Date Taken: 11/22/2008 Date Received: 11/22/2008  FINAL DIAGNOSIS  MICROSCOPIC EXAMINATION AND DIAGNOSIS  COLON AT SPLENIC FLEXURE, POLYP: - BENIGN COLONIC MUCOSA WITH A SMALL LYMPHOID AGGREGATE.- NO ADENOMATOUS EPITHELIUM  OR MALIGNANCY IDENTIFIED.  FINAL  Patient: BYRDIE, MIYAZAKI cMted: 03/24/2014 Client: [place]  Accession: [id number] Received: 03/24/2014  FINAL DIAGNOSIS  Diagnosis  Pleura, biopsy, right posterior thorax  - METASTATIC CARCINOMA.  Microscopic Comment  The morphologic differential includes metastatic breast carcinoma. Estrogen receptor, progesterone receptor and Her 2 neu will be performed. (JDP:gt, 03/25/14)  Claudette Laws MD  Pathologist, Electronic Signature  (Case signed 03/25/2014)        Imaging Studies:  Dg Chest 2 View  03/28/2014   CLINICAL DATA:  Generalized weakness, fatigue, loss of appetite, abdominal pain, lung nodule, history hypertension, CHF, breast cancer  EXAM: CHEST  2 VIEW  COMPARISON:  03/24/2014  FINDINGS: Enlargement of cardiac silhouette.  Atherosclerotic calcification aorta.  BILATERAL upper lobe volume loss and scarring with superior retraction of the hila.  Slight  prominence of central pulmonary arteries question pulmonary hypertension.  Severe COPD changes with bibasilar effusions atelectasis.  Nodular focus in lateral lower RIGHT chest proximally 2.8 cm in greatest size unchanged.  No definite acute infiltrate or pneumothorax.  Bones demineralized.  IMPRESSION: Severe COPD changes with BILATERAL upper lobe scarring/volume loss, bibasilar pleural effusions and bibasilar atelectasis.  Persistent nodular density lower lateral RIGHT chest.  Enlargement of cardiac silhouette.  No new abnormalities.   Electronically Signed   By: Lavonia Dana M.D.   On: 03/28/2014 08:45   Ct Abdomen Pelvis W Contrast  03/28/2014   CLINICAL DATA:  Abdominal pain. Anorexia. Abdominal distention and dilated bowel loops seen radiographically  EXAM: CT ABDOMEN AND PELVIS WITH CONTRAST  TECHNIQUE: Multidetector CT imaging of the abdomen and pelvis was performed using the standard protocol following bolus administration of intravenous contrast.  CONTRAST:  154m OMNIPAQUE IOHEXOL 300 MG/ML  SOLN  COMPARISON:  PET-CT on 03/11/2014  FINDINGS: Liver: Hepatic steatosis noted. A small less than 1 cm hypervascular lesion is seen in the posterior right hepatic lobe on image 25 which is indeterminate. Differential diagnosis includes flash filling hemangioma and hypervascular liver metastasis. No other definite liver masses are identified.  Gallbladder/Biliary:  Unremarkable.  Pancreas: No mass, inflammatory changes, or other parenchymal abnormality identified.  Spleen:  Within normal limits in size and appearance.  Adrenal Glands:  No mass identified.  Kidneys/Urinary Tract: No masses identified. No evidence of hydronephrosis.  Lymph Nodes: Mild upper abdominal lymphadenopathy seen in the gastrohepatic ligament measuring 10 mm on image 20 and in the right retrocrural region measuring 12 mm on image 16. No other pathologically enlarged lymph nodes identified within the abdomen or pelvis.  Bowel/Peritoneum:  Gaseous distention is seen involving nondependent portion of colon, consistent with mild colonic ileus. No evidence of bowel obstruction.  Pelvic/Reproductive Organs: No mass or other significant abnormality identified.  Vascular:  No evidence of abdominal aortic aneurysm.  Musculoskeletal: No suspicious bone lesions identified. Left hip prosthesis noted.  Other: Moderate bilateral pleural effusions show increased in size. Right lower lung nodules again demonstrated, as better seen on recent PET-CT.  IMPRESSION: Colonic ileus pattern.  No evidence of bowel obstruction.  Mild increase in size of moderate bilateral pleural effusions. Right lower lung nodules again noted, as better evaluated on recent PET-CT.  Stable mild lymphadenopathy in gastrohepatic ligament and right retrocrural region, consistent with metastatic disease.  Sub-cm hypervascular lesion in the posterior right hepatic lobe, which is indeterminate. Differential diagnosis includes flash filling hemangioma and hypervascular liver metastasis. Consider abdomen MRI without and with contrast for further evaluation.   Electronically Signed   By: JJenny Reichmann  Kris Hartmann M.D.   On: 03/28/2014 10:14   Dg Abd 2 Views  03/28/2014   CLINICAL DATA:  Weakness vis T and abdominal pain  EXAM: ABDOMEN - 2 VIEW  COMPARISON:  None.  FINDINGS: There is mild gaseous distention of the ascending and transverse and proximal descending colons. There is a small amount of gas in the rectum. There is stool in the mid transverse colon. No free extraluminal gas is demonstrated. There are small bilateral pleural effusions blunting the costophrenic angles. The bones are osteopenic and there is mild dextro curvature of the mid lumbar spine. The small bowel is not abnormally distended.  IMPRESSION: Findings suggest a colonic ileus. There is no evidence of high-grade obstruction nor of perforation.   Electronically Signed   By: David  [place]   On: 03/28/2014 08:41   NUCLEAR MEDICINE PET SKULL  BASE TO THIGH  COMPARISON: Multiple exams, including 02/28/2014 and 09/02/2003  FINDINGS:  NECK  No hypermetabolic lymph nodes in the neck.  CHEST  Malignant activity pulmonary nodules, pleural nodularity, and mediastinal and hilar adenopathy noted on the right. Findings include a 1.7 cm precarinal nodule with maximum standard uptake value 6.8, a 1.3 cm subcarinal node with maximum standard uptake value 7.3, a tiny right hilar node with maximum standard uptake  value 4.1, bandlike nodularity along the minor fissure with posterior component having maximum standard uptake value of 5.0 and anterior component with maximum standard uptake value 4.9 ; right lower lobe posterior nodules with the dominant 1.8 cm nodule having maximum standard uptake value of 5.1 ; a pleural nodule adjacent to the medial left eleventh rib measuring 7 mm in diameter with a maximum standard uptake value of 4.9. In addition there is a subtle left internal mammary lymph node with maximum standard uptake value 2.8. Bilateral pleural effusions. Stable left apical volume loss/consolidation. High activity observed focally in the distal thoracic esophagus, possibly within the esophagus itself or in a paraesophageal node, maximum standard uptake value 8.8.  ABDOMEN/PELVIS  At the hiatus, a 1.1 cm short axis lymph node has maximum standard uptake value 8.0. In the gastrohepatic ligament, a 0.8 cm short axis lymph node has maximum standard uptake value 5.4. Indistinctly marginated right periaortic lymph node adjacent to the hemidiaphragmatic crus in the upper abdomen has maximum standard uptake value of 4.0, mildly hypermetabolic. Faint activity in a 1.2 cm right inguinal lymph node, maximum standard uptake value 2.4. Faint activity in a left inguinal lymph node measuring 1.5 cm in short axis, maximum standard uptake value 2.2.  SKELETON  No focal hypermetabolic activity to suggest skeletal metastasis.  IMPRESSION:  1. Malignant level  activity observed an right-sided pulmonary nodules, right pleural nodularity, mediastinal and right hilar adenopathy, gastrohepatic ligament adenopathy, and upper right periaortic adenopathy. Probable involvement of a left internal mammary lymph node. Equivocal involvement of single bilateral  inguinal lymph nodes.    Assessment/Plan: 80 y.o.  1. Stage III-A left-sided breast cancer        diagnosed in February of 2000. She was maintained on Femara from February 2005 through November 2008.  2.   History of chronic parenchymal lung densities and a borderline elevated CA 27.29       History of Pleural Effusion s/p thoracentesis Multiple CT scans have been negative for evidence of disease progression until recently.        A PET scan on 03/11/14  showed malignant level activity observed an right-sided pulmonary nodules, right pleural nodularity,mediastinal and right hilar adenopathy, gastrohepatic  ligament adenopathy, and upper right periaortic                      adenopathy. Probable involvement of a left internal mammary lymph node. Equivocal involvement of single bilateral inguinal lymph nodes.        On 03/24/14 when she had a CT right pleural biopsy.       Path report, case number [id number] is positive for metastatic carcinoma, with differential including metastatic breast  carcinoma. Thus, sample was sent for ER/PR, and HER2 Neu which results are pending.   IN will await for the report to become available to proceed with further recommendations. An appointment with Dr. [name] to be  arranged after discharge.   3. Ilieus  CT of the abdomen and pelvis with contrast on 8/31 was consistent with ileus without SBO, for which she was admitted for symptom management. prior colonoscopies had been negative, most recently on 11/22/08, where a benign      colonic polyp was seen         4. Atrial Fibrillation         5. DNR         6. Discharge planning:  Consider Case Management involvement in the  care of this patient once discharged. She has senile dementia and in view of her current clinical status, she will need nursing care as outpatient, as she lives buy herself.  Other medical issues including nausea and vomiting, decreased appetite related to ileus,  as per admitting team       7.  Dementia   **Disclaimer: This note was dictated with voice recognition software. Similar sounding words can inadvertently be transcribed and this note may contain transcription errors which may not have been corrected upon publication of note.Sharene Butters E, PA-C 03/29/2014  8:03 AM  Ms. Loveall was examined and the chart was reviewed. At the lung biopsy confirms metastatic carcinoma. We are waiting on the breast cancer prognostic profile. She most likely has metastatic breast cancer. We will consider treatment options when the breast profile is available.  Ms. Dorfman appears to have progressive dementia. Her daughter was not present this morning. I suspect she will need nursing home placement or an assisted living arrangement.  We will recommend hormonal therapy if the tumor returned ER positive. She does not appear to be a candidate for systemic chemotherapy.  The small pleural effusions are most likely related to heart failure. The cytology on pleural fluid has been negative in the past.  Julieanne Manson, M.D.

## 2014-03-30 DIAGNOSIS — Z170 Estrogen receptor positive status [ER+]: Secondary | ICD-10-CM

## 2014-03-30 DIAGNOSIS — I509 Heart failure, unspecified: Secondary | ICD-10-CM

## 2014-03-30 DIAGNOSIS — I5031 Acute diastolic (congestive) heart failure: Secondary | ICD-10-CM

## 2014-03-30 DIAGNOSIS — I5032 Chronic diastolic (congestive) heart failure: Secondary | ICD-10-CM

## 2014-03-30 DIAGNOSIS — R0902 Hypoxemia: Secondary | ICD-10-CM

## 2014-03-30 DIAGNOSIS — I4891 Unspecified atrial fibrillation: Secondary | ICD-10-CM

## 2014-03-30 DIAGNOSIS — J9610 Chronic respiratory failure, unspecified whether with hypoxia or hypercapnia: Secondary | ICD-10-CM

## 2014-03-30 DIAGNOSIS — E876 Hypokalemia: Secondary | ICD-10-CM

## 2014-03-30 DIAGNOSIS — J90 Pleural effusion, not elsewhere classified: Secondary | ICD-10-CM

## 2014-03-30 DIAGNOSIS — C50919 Malignant neoplasm of unspecified site of unspecified female breast: Secondary | ICD-10-CM

## 2014-03-30 LAB — CBC
HCT: 37.5 % (ref 36.0–46.0)
HEMOGLOBIN: 12.2 g/dL (ref 12.0–15.0)
MCH: 28.2 pg (ref 26.0–34.0)
MCHC: 32.5 g/dL (ref 30.0–36.0)
MCV: 86.6 fL (ref 78.0–100.0)
Platelets: 297 10*3/uL (ref 150–400)
RBC: 4.33 MIL/uL (ref 3.87–5.11)
RDW: 15.2 % (ref 11.5–15.5)
WBC: 7.5 10*3/uL (ref 4.0–10.5)

## 2014-03-30 LAB — BASIC METABOLIC PANEL
Anion gap: 13 (ref 5–15)
BUN: 11 mg/dL (ref 6–23)
CO2: 26 mEq/L (ref 19–32)
CREATININE: 0.74 mg/dL (ref 0.50–1.10)
Calcium: 8.5 mg/dL (ref 8.4–10.5)
Chloride: 100 mEq/L (ref 96–112)
GFR calc Af Amer: 86 mL/min — ABNORMAL LOW (ref >90)
GFR calc non Af Amer: 74 mL/min — ABNORMAL LOW (ref >90)
GLUCOSE: 91 mg/dL (ref 70–99)
POTASSIUM: 3.2 meq/L — AB (ref 3.7–5.3)
Sodium: 139 mEq/L (ref 137–147)

## 2014-03-30 LAB — PROTIME-INR
INR: 4.21 — ABNORMAL HIGH (ref 0.00–1.49)
Prothrombin Time: 40.6 seconds — ABNORMAL HIGH (ref 11.6–15.2)

## 2014-03-30 LAB — MAGNESIUM: Magnesium: 2 mg/dL (ref 1.5–2.5)

## 2014-03-30 LAB — TROPONIN I: Troponin I: <0.3 ng/mL (ref <0.30)

## 2014-03-30 MED ORDER — POTASSIUM CHLORIDE CRYS ER 20 MEQ PO TBCR
40.0000 meq | EXTENDED_RELEASE_TABLET | Freq: Four times a day (QID) | ORAL | Status: AC
  Administered 2014-03-30 (×2): 40 meq via ORAL
  Filled 2014-03-30 (×2): qty 2

## 2014-03-30 MED ORDER — FUROSEMIDE 10 MG/ML IJ SOLN
40.0000 mg | Freq: Once | INTRAMUSCULAR | Status: AC
  Administered 2014-03-30: 40 mg via INTRAVENOUS
  Filled 2014-03-30: qty 4

## 2014-03-30 MED ORDER — LETROZOLE 2.5 MG PO TABS
2.5000 mg | ORAL_TABLET | Freq: Every day | ORAL | Status: DC
  Administered 2014-03-30 – 2014-04-01 (×3): 2.5 mg via ORAL
  Filled 2014-03-30 (×3): qty 1

## 2014-03-30 NOTE — Progress Notes (Signed)
Dr. Hartford Poli notified of 2.2 sec and 1.99 sec pauses.

## 2014-03-30 NOTE — Progress Notes (Signed)
Physical Therapy Treatment Patient Details Name: Leah Guerra MRN: [id number] DOB: 07-16-1925 Today's Date: 03/30/2014    History of Present Illness 80 yo female admitted with ileus. Hx of dementia, CHF, HTN, afib. Pt lives alone but daughter is nearby    PT Comments    Pt in bed on 1 lt sats 98%.  Removed oxygen and assited OOB to amb to BR.  Pt required increased time and assist for mild gait instability.  Amb in hallway RA sats decreased to 87%.  Pt tolerated session but required freq rest breaks.  Positioned in recliner.    Follow Up Recommendations  Home health PT;Supervision for mobility/OOB     Equipment Recommendations  None recommended by PT    Recommendations for Other Services       Precautions / Restrictions Precautions Precautions: Fall Restrictions Weight Bearing Restrictions: No    Mobility  Bed Mobility Overal bed mobility: Needs Assistance Bed Mobility: Supine to Sit     Supine to sit: Supervision     General bed mobility comments: increased time  Transfers Overall transfer level: Needs assistance Equipment used: Rolling walker (2 wheeled) Transfers: Sit to/from Stand Sit to Stand: Min guard;Min assist         General transfer comment: assist to rise, stabilize, control descent. VC safety, technique, hand placement  Ambulation/Gait Ambulation/Gait assistance: Min guard;Supervision Ambulation Distance (Feet): 115 Feet Assistive device: Rolling walker (2 wheeled) Gait Pattern/deviations: Step-to pattern;Step-through pattern;Decreased stride length;Trunk flexed Gait velocity: decreased   General Gait Details: close guard for safety. O2 sats 87% RA. fatigues fairly easily.    Stairs            Wheelchair Mobility    Modified Rankin (Stroke Patients Only)       Balance                                    Cognition                            Exercises      General Comments         Pertinent Vitals/Pain      Home Living                      Prior Function            PT Goals (current goals can now be found in the care plan section) Progress towards PT goals: Progressing toward goals    Frequency  Min 3X/week    PT Plan      Co-evaluation             End of Session Equipment Utilized During Treatment: Gait belt Activity Tolerance: Patient tolerated treatment well;Patient limited by fatigue Patient left: in chair;with call bell/phone within reach;with chair alarm set     Time: 6213-0865 PT Time Calculation (min): 24 min  Charges:  $Gait Training: 8-22 mins $Therapeutic Activity: 8-22 mins                    G Codes:      Rica Koyanagi  PTA [facility]  Acute  Rehab Pager      [phone]

## 2014-03-30 NOTE — Progress Notes (Signed)
Leah Guerra [facility]   DOB:10/16/1924   VV#:[id number]   LJQ#:[id number]  HPI:  She is up in the bedside recliner. She appears comfortable. She remains confused.    Scheduled Meds: . atorvastatin  40 mg Oral Daily  . carvedilol  3.125 mg Oral BID WC  . cefTRIAXone (ROCEPHIN)  IV  1 g Intravenous Q24H  . citalopram  20 mg Oral Daily  . furosemide  40 mg Oral Daily  . levothyroxine  112 mcg Oral QAC breakfast  . sodium chloride  500 mL Intravenous Once  . sodium chloride  3 mL Intravenous Q12H  . Warfarin - Pharmacist Dosing Inpatient   Does not apply q1800   Continuous Infusions:   PRN Meds:.acetaminophen, ondansetron (ZOFRAN) IV, ondansetron, zolpidem   Objective:  Filed Vitals:   03/30/14 0430  BP: 132/76  Pulse: 64  Temp: 98.2 F (36.8 C)  Resp: 20      Intake/Output Summary (Last 24 hours) at 03/30/14 1245 Last data filed at 03/30/14 0805  Gross per 24 hour  Intake    410 ml  Output   1675 ml  Net  -1265 ml    Physical exam not performed today   Labs:   Recent Labs Lab 03/24/14 0730 03/28/14 0840 03/29/14 0415 03/30/14 0415  WBC 9.1 12.2* 7.7 7.5  HGB 13.9 13.3 11.7* 12.2  HCT 42.6 40.9 36.2 37.5  PLT 261 301 258 297  MCV 89.3 88.5 88.9 86.6  MCH 29.1 28.8 28.7 28.2  MCHC 32.6 32.5 32.3 32.5  RDW 15.6* 15.6* 15.6* 15.2  LYMPHSABS  --  1.1  --   --   MONOABS  --  1.1*  --   --   EOSABS  --  0.1  --   --   BASOSABS  --  0.0  --   --      Chemistries:    Recent Labs Lab 03/28/14 0840 03/29/14 0415 03/30/14 0415 03/30/14 1133  NA 141 139 139  --   K 3.0* 3.7 3.2*  --   CL 99 101 100  --   CO2 27 26 26   --   GLUCOSE 132* 91 91  --   BUN 18 12 11   --   CREATININE 0.90 0.80 0.74  --   CALCIUM 9.7 8.6 8.5  --   MG  --   --   --  2.0  AST 45* 36  --   --   ALT 26 21  --   --   ALKPHOS 111 105  --   --   BILITOT 0.9 0.6  --   --     GFR Estimated Creatinine Clearance: 46.4 ml/min (by C-G formula based on Cr of 0.74).  Liver  Function Tests:  Recent Labs Lab 03/28/14 0840 03/29/14 0415  AST 45* 36  ALT 26 21  ALKPHOS 111 105  BILITOT 0.9 0.6  PROT 8.3 7.0  ALBUMIN 3.2* 2.6*    Recent Labs Lab 03/28/14 0840  LIPASE 42   No results found for this basename: AMMONIA,  in the last 168 hours  Urine Studies     Component Value Date/Time   COLORURINE AMBER* 03/28/2014 0919   APPEARANCEUR CLOUDY* 03/28/2014 0919   LABSPEC 1.021 03/28/2014 0919   PHURINE 5.5 03/28/2014 0919   GLUCOSEU NEGATIVE 03/28/2014 0919   HGBUR SMALL* 03/28/2014 0919   BILIRUBINUR NEGATIVE 03/28/2014 0919   KETONESUR NEGATIVE 03/28/2014 0919   PROTEINUR 100* 03/28/2014 0919   UROBILINOGEN 1.0 03/28/2014  [place] 03/28/2014 0919   LEUKOCYTESUR NEGATIVE 03/28/2014 0919    Coagulation profile  Recent Labs Lab 03/24/14 0730 03/28/14 0840 03/29/14 0415 03/30/14 0415  INR 1.60* 2.51* 3.41* 4.21*    Cardiac Enzymes:  Recent Labs Lab 03/28/14 0840 03/30/14 1133  TROPONINI <0.30 <0.30   Thyroid function studies  Recent Labs  03/28/14 1400  TSH 4.820*    FINAL  Patient: Leah Guerra, Leah Guerra cMted: 03/24/2014 Client: [place]  Accession: [id number] Received: 03/24/2014  FINAL DIAGNOSIS  Diagnosis  Pleura, biopsy, right posterior thorax  - METASTATIC CARCINOMA.  Microscopic Comment  The morphologic differential includes metastatic breast carcinoma. Estrogen receptor, progesterone receptor and Her 2 neu will be performed. (JDP:gt, 03/25/14)  Claudette Laws MD  Pathologist, Electronic Signature  (Case signed 03/25/2014)        Imaging Studies:  Dg Abd Portable 2v  03/29/2014   CLINICAL DATA:  Assess ileus  EXAM: PORTABLE ABDOMEN - 2 VIEW  COMPARISON:  CT 03/28/2014  FINDINGS: Oral contrast material is demonstrated throughout the Leah Guerra. Slight improvement in mild dilatation of the cecum and ascending Leah Guerra. No evidence for small bowel dilatation. No evidence for pneumatosis.  Cross-table lateral demonstrates no evidence for free intraperitoneal air. Left hip arthroplasty. Lumbar spine degenerative change.  IMPRESSION: Minimal dilatation the cecum and ascending Leah Guerra, suggestive of mild colonic ileus, improved from prior CT. No evidence for small bowel obstruction.   Electronically Signed   By: Lovey Newcomer M.D.   On: 03/29/2014 11:25   NUCLEAR MEDICINE PET SKULL BASE TO THIGH  COMPARISON: Multiple exams, including 02/28/2014 and 09/02/2003  FINDINGS:  NECK  No hypermetabolic lymph nodes in the neck.  CHEST  Malignant activity pulmonary nodules, pleural nodularity, and mediastinal and hilar adenopathy noted on the right. Findings include a 1.7 cm precarinal nodule with maximum standard uptake value 6.8, a 1.3 cm subcarinal node with maximum standard uptake value 7.3, a tiny right hilar node with maximum standard uptake  value 4.1, bandlike nodularity along the minor fissure with posterior component having maximum standard uptake value of 5.0 and anterior component with maximum standard uptake value 4.9 ; right lower lobe posterior nodules with the dominant 1.8 cm nodule having maximum standard uptake value of 5.1 ; a pleural nodule adjacent to the medial left eleventh rib measuring 7 mm in diameter with a maximum standard uptake value of 4.9. In addition there is a subtle left internal mammary lymph node with maximum standard uptake value 2.8. Bilateral pleural effusions. Stable left apical volume loss/consolidation. High activity observed focally in the distal thoracic esophagus, possibly within the esophagus itself or in a paraesophageal node, maximum standard uptake value 8.8.  ABDOMEN/PELVIS  At the hiatus, a 1.1 cm short axis lymph node has maximum standard uptake value 8.0. In the gastrohepatic ligament, a 0.8 cm short axis lymph node has maximum standard uptake value 5.4. Indistinctly marginated right periaortic lymph node adjacent to the hemidiaphragmatic crus in the  upper abdomen has maximum standard uptake value of 4.0, mildly hypermetabolic. Faint activity in a 1.2 cm right inguinal lymph node, maximum standard uptake value 2.4. Faint activity in a left inguinal lymph node measuring 1.5 cm in short axis, maximum standard uptake value 2.2.  SKELETON  No focal hypermetabolic activity to suggest skeletal metastasis.  IMPRESSION:  1. Malignant level activity observed an right-sided pulmonary nodules, right pleural nodularity, mediastinal and right hilar adenopathy, gastrohepatic ligament adenopathy, and upper right periaortic adenopathy. Probable involvement of a left  internal mammary lymph node. Equivocal involvement of single bilateral  inguinal lymph nodes.    Assessment/Plan: 80 y.o.  1. Stage III-A left-sided breast cancer        diagnosed in February of 2000. She was maintained on Femara from February 2005 through November 2008.  2.   History of chronic parenchymal lung densities and a borderline elevated CA 27.29       History of Pleural Effusion s/p thoracentesis Multiple CT scans have been negative for evidence of disease progression until recently.        A PET scan on 03/11/14  showed malignant level activity observed an right-sided pulmonary nodules, right pleural nodularity,mediastinal and right hilar adenopathy, gastrohepatic ligament adenopathy, and upper right periaortic                      adenopathy. Probable involvement of a left internal mammary lymph node. Equivocal involvement of single bilateral inguinal lymph nodes.        On 03/24/14 when she had a CT right pleural biopsy.       Path report, case number [id number] is positive for metastatic carcinoma, with differential including metastatic breast  carcinoma. ER positive, PR positive, and HER-2 negative. IN    3. Ilieus on hospital admission          4. Atrial Fibrillation         5.  Dementia   **Disclaimer: This note was dictated with voice recognition software. Similar  sounding words can inadvertently be transcribed and this note may contain transcription errors which may not have been corrected upon publication of note.Betsy Coder, MD 03/30/2014  12:45 PM  The lung nodule biopsy confirms metastatic breast cancer, ER positive/PR positive. I recommend aromatase inhibitor therapy with letrozole. I discussed the case with her daughter by telephone last night. She acknowledges that Ms. Futrell has progressive dementia and is no longer able to live alone.  Recommendations: 1. Begin letrozole 2.5 mg daily for treatment of metastatic breast cancer 2. Followup as scheduled at the [facility].  I will be out 03/31/2014 through 04/04/2014. Please call oncology as needed. Julieanne Manson, M.D.

## 2014-03-30 NOTE — Progress Notes (Addendum)
PROGRESS NOTE  Leah Guerra XBW:[id number] DOB: June 02, 1925 DOA: 03/28/2014 PCP:  Melinda Crutch, MD  HPI: Leah Guerra is a 80 y.o. female has a past medical history significant for remote history of breast cancer, mild dementia, prior lung nodules and pleural effusion on left, tapped int he past and negative for malignancy, diastolic CHF, HTN, A fib on coumadin, HLD, presents to the ED complaining of abdominal pain, nausea and vomiting, found to have an ileus.  Subjective/ 24 H Interval events Admitted to the hospital with ileus, had 2 bowel movements yesterday and one documented bowel movement earlier today.  Assessment/Plan:  Ileus -Cannot rule out the recent use of narcotic pain medications as cause. -On clears, repeated abdominal film showed improvement of the colonic ileus. -Patient has had 3 bowel movements since admission to the hospital. Advance to full liquids  Metastatic carcinoma  -Lung biopsy 4 days ago shows features c/w metastatic carcinoma, and stains for HER, Estrogen and progesterone are pending.  -Oncology following.  Chronic diastolic heart failure -EF ~50% in 2014. Elevated BNP, careful with fluids, she is clinically without crackles or JVD  -IV Lasix this morning  HTN - resume home medications   Possible UTI  -Urinalysis with bacteria, negative leukocytes or nitrites.  -Has leukocytosis and overall weakness, negative urine culture, discontinue ceftriaxone.  Bibasilar pleural effusions - monitor clinically, persistent, worrisome for malignancy. Repeat Lasix again today.  Hypokalemia - poor po intake, on Lasix. Replete orally.   Hypothyroidism - TSH 4.8, continue current regimen.   A fib  -Rate controlled, on Coumadin. She discontinued Coumadin last week in anticipation for biopsy but resumed.  -Continue coumadin per pharmacy.   Cardiac pause -Called by the nurse that patient has 1.99 second pause. I reviewed to telemetry. -She is  patient does have atrial flutter/fibrillation with variable conduction. -Patient is on Cardizem and Coreg, discontinue the Cardizem. Note that patient is DO NOT RESUSCITATE.  Diet: clear liquid  Fluids: none DVT Prophylaxis: coumadin  Code Status: DNR Family Communication:  Disposition Plan: inpatient, home when ready, PT evaluation pending  Consultants:  Oncology   Procedures:  None    Antibiotics Ceftriaxone 8/31 >>  Studies  Filed Vitals:   03/29/14 1345 03/29/14 2200 03/30/14 0430 03/30/14 1410  BP: 106/74 148/76 132/76 85/61  Pulse: 77 67 64 67  Temp: 97.4 F (36.3 C) 97.9 F (36.6 C) 98.2 F (36.8 C) 97.8 F (36.6 C)  TempSrc: Oral Oral Oral Oral  Resp: 18 22 20 20   Height:      Weight:      SpO2: 92% 98% 98% 96%    Intake/Output Summary (Last 24 hours) at 03/30/14 1435 Last data filed at 03/30/14 0805  Gross per 24 hour  Intake    170 ml  Output   1175 ml  Net  -1005 ml   Filed Weights   03/28/14 1243  Weight: 64.1 kg (141 lb 5 oz)   Exam:  General:  NAD  Cardiovascular: RRR  Respiratory: mild bilateral crackles  Abdomen: soft, non tender  MSK: no edema  Neuro: non focal  Data Reviewed: Basic Metabolic Panel:  Recent Labs Lab 03/28/14 0840 03/29/14 0415 03/30/14 0415 03/30/14 1133  NA 141 139 139  --   K 3.0* 3.7 3.2*  --   CL 99 101 100  --   CO2 27 26 26   --   GLUCOSE 132* 91 91  --   BUN 18 12 11   --  CREATININE 0.90 0.80 0.74  --   CALCIUM 9.7 8.6 8.5  --   MG  --   --   --  2.0   Liver Function Tests:  Recent Labs Lab 03/28/14 0840 03/29/14 0415  AST 45* 36  ALT 26 21  ALKPHOS 111 105  BILITOT 0.9 0.6  PROT 8.3 7.0  ALBUMIN 3.2* 2.6*    Recent Labs Lab 03/28/14 0840  LIPASE 42   CBC:  Recent Labs Lab 03/24/14 0730 03/28/14 0840 03/29/14 0415 03/30/14 0415  WBC 9.1 12.2* 7.7 7.5  NEUTROABS  --  9.8*  --   --   HGB 13.9 13.3 11.7* 12.2  HCT 42.6 40.9 36.2 37.5  MCV 89.3 88.5 88.9 86.6  PLT  261 301 258 297   Cardiac Enzymes:  Recent Labs Lab 03/28/14 0840 03/30/14 1133  TROPONINI <0.30 <0.30   BNP (last 3 results)  Recent Labs  03/28/14 0840  PROBNP 3172.0*   Studies: Dg Abd Portable 2v  03/29/2014   CLINICAL DATA:  Assess ileus  EXAM: PORTABLE ABDOMEN - 2 VIEW  COMPARISON:  CT 03/28/2014  FINDINGS: Oral contrast material is demonstrated throughout the colon. Slight improvement in mild dilatation of the cecum and ascending colon. No evidence for small bowel dilatation. No evidence for pneumatosis. Cross-table lateral demonstrates no evidence for free intraperitoneal air. Left hip arthroplasty. Lumbar spine degenerative change.  IMPRESSION: Minimal dilatation the cecum and ascending colon, suggestive of mild colonic ileus, improved from prior CT. No evidence for small bowel obstruction.   Electronically Signed   By: Lovey Newcomer M.D.   On: 03/29/2014 11:25    Scheduled Meds: . atorvastatin  40 mg Oral Daily  . carvedilol  3.125 mg Oral BID WC  . cefTRIAXone (ROCEPHIN)  IV  1 g Intravenous Q24H  . citalopram  20 mg Oral Daily  . furosemide  40 mg Oral Daily  . letrozole  2.5 mg Oral Daily  . levothyroxine  112 mcg Oral QAC breakfast  . sodium chloride  500 mL Intravenous Once  . sodium chloride  3 mL Intravenous Q12H  . Warfarin - Pharmacist Dosing Inpatient   Does not apply q1800   Continuous Infusions:   Active Problems:   Chronic atrial fibrillation   Long term (current) use of anticoagulants   Hypertension   Lung nodule with a history of breats cancer- followed by Dr [name]   SOB (shortness of breath)   Chronic respiratory failure with hypoxia   Pleural effusion on left   Chronic diastolic heart failure   Ileus   Unspecified hypothyroidism  Time spent: 25  This note has been created with Surveyor, quantity. Any transcriptional errors are unintentional.   Birdie Hopes, MD  [facility] Pager  [phone]. If 7 PM - 7 AM, please contact night-coverage at [email or web address], password [facility] 03/30/2014, 2:35 PM  LOS: 2 days

## 2014-03-30 NOTE — Progress Notes (Signed)
ANTICOAGULATION CONSULT NOTE - Follow Up  Pharmacy Consult for warfarin Indication: atrial fibrillation  No Known Allergies  Patient Measurements: Height: 5\' 6"  (167.6 cm) Weight: 141 lb 5 oz (64.1 kg) IBW/kg (Calculated) : 59.3  Vital Signs: Temp: 98.2 F (36.8 C) (09/02 0430) Temp src: Oral (09/02 0430) BP: 132/76 mmHg (09/02 0430) Pulse Rate: 64 (09/02 0430)  Labs:  Recent Labs  03/28/14 0840 03/29/14 0415 03/30/14 0415  HGB 13.3 11.7* 12.2  HCT 40.9 36.2 37.5  PLT 301 258 297  LABPROT 27.1* 34.4* 40.6*  INR 2.51* 3.41* 4.21*  CREATININE 0.90 0.80 0.74  TROPONINI <0.30  --   --     Estimated Creatinine Clearance: 46.4 ml/min (by C-G formula based on Cr of 0.74).   Medications:  Scheduled:  . atorvastatin  40 mg Oral Daily  . carvedilol  3.125 mg Oral BID WC  . cefTRIAXone (ROCEPHIN)  IV  1 g Intravenous Q24H  . citalopram  20 mg Oral Daily  . diltiazem  180 mg Oral Daily  . furosemide  40 mg Oral Daily  . levothyroxine  112 mcg Oral QAC breakfast  . sodium chloride  500 mL Intravenous Once  . sodium chloride  3 mL Intravenous Q12H  . Warfarin - Pharmacist Dosing Inpatient   Does not apply q1800    Assessment: 80 yo female admitted from [facility] secondary to weakness, abd pain, N/V and has not eaten since biopsy 8/27. CT with possible ileus and patient also with hypokalemia and dehydration. Note that patient had CT biopsy on 8/27 to evaluate lung mass and is on warfarin long term for hx Afib. Patient with hx breast cancer and evaluating lung mass for primary lung cancer vs metastases from previous cancer. Patient had been off of warfarin x 8 days as of 8/27 and INR was 1.6 which they elected to proceed with biopsy on 8/27. Per patient's daughter, patient resumed warfarin on Friday 8/28. Patient reportedly on warfarin 4mg  daily except 6mg  on Thursdays with last dose of 4mg  being yesterday, 8/30.  Warfarin Doses Given as Inpatient 8/31: 4mg  9/1: none  9/2:    INR 4.21 is supratherapeutic and increasing despite holding warfarin.  No reported bleeding or complications.  CBC stable  No significant DDI's noted  Diet: Clear liquids, ileus improved on 9/1 xray.  Goal of Therapy:  INR 2-3   Plan:   NO warfarin today due to INR > 3  Daily INR   Gretta Arab PharmD, BCPS Pager [phone] 03/30/2014 11:00 AM

## 2014-03-31 DIAGNOSIS — Z7901 Long term (current) use of anticoagulants: Secondary | ICD-10-CM

## 2014-03-31 DIAGNOSIS — R0602 Shortness of breath: Secondary | ICD-10-CM

## 2014-03-31 LAB — CBC
HCT: 40.4 % (ref 36.0–46.0)
Hemoglobin: 12.9 g/dL (ref 12.0–15.0)
MCH: 27.8 pg (ref 26.0–34.0)
MCHC: 31.9 g/dL (ref 30.0–36.0)
MCV: 87.1 fL (ref 78.0–100.0)
Platelets: 334 10*3/uL (ref 150–400)
RBC: 4.64 MIL/uL (ref 3.87–5.11)
RDW: 15.2 % (ref 11.5–15.5)
WBC: 7.2 10*3/uL (ref 4.0–10.5)

## 2014-03-31 LAB — BASIC METABOLIC PANEL
ANION GAP: 13 (ref 5–15)
BUN: 9 mg/dL (ref 6–23)
CO2: 26 meq/L (ref 19–32)
Calcium: 8.6 mg/dL (ref 8.4–10.5)
Chloride: 99 mEq/L (ref 96–112)
Creatinine, Ser: 0.78 mg/dL (ref 0.50–1.10)
GFR calc Af Amer: 85 mL/min — ABNORMAL LOW (ref >90)
GFR calc non Af Amer: 73 mL/min — ABNORMAL LOW (ref >90)
GLUCOSE: 94 mg/dL (ref 70–99)
Potassium: 4.2 mEq/L (ref 3.7–5.3)
SODIUM: 138 meq/L (ref 137–147)

## 2014-03-31 LAB — PROTIME-INR
INR: 3.95 — AB (ref 0.00–1.49)
Prothrombin Time: 38.6 seconds — ABNORMAL HIGH (ref 11.6–15.2)

## 2014-03-31 MED ORDER — POLYETHYLENE GLYCOL 3350 17 G PO PACK
17.0000 g | PACK | Freq: Every day | ORAL | Status: DC
  Administered 2014-03-31 – 2014-04-01 (×2): 17 g via ORAL
  Filled 2014-03-31 (×3): qty 1

## 2014-03-31 NOTE — Progress Notes (Signed)
Assumed care of patient at 1600, patient up in chair and has no needs at this time. Agree with previous RN's assessment. Will continue to monitor patient and follow up with plan of care.

## 2014-03-31 NOTE — Progress Notes (Signed)
Physical Therapy Treatment Patient Details Name: Leah Guerra MRN: [id number] DOB: Oct 31, 1924 Today's Date: 04/22/14    History of Present Illness 80 yo female admitted with ileus. Hx of dementia, CHF, HTN, afib. Pt lives alone but daughter is nearby    PT Comments    Pt ambulated in hallway and SpO2 on room air above 92% throughout session.  Follow Up Recommendations  Home health PT;Supervision for mobility/OOB     Equipment Recommendations  None recommended by PT    Recommendations for Other Services       Precautions / Restrictions Precautions Precautions: Fall    Mobility  Bed Mobility               General bed mobility comments: pt up in recliner on arrival  Transfers Overall transfer level: Needs assistance Equipment used: Rolling walker (2 wheeled) Transfers: Sit to/from Stand Sit to Stand: Min guard         General transfer comment: verbal cues for hand placement and bringing RW back to chair  Ambulation/Gait Ambulation/Gait assistance: Min guard Ambulation Distance (Feet): 120 Feet Assistive device: Rolling walker (2 wheeled) Gait Pattern/deviations: Step-through pattern;Decreased stride length Gait velocity: decreased   General Gait Details: min/guard for safety, fatigues quickly, SpO2 93% on room air   Stairs            Wheelchair Mobility    Modified Rankin (Stroke Patients Only)       Balance                                    Cognition Arousal/Alertness: Awake/alert Behavior During Therapy: WFL for tasks assessed/performed Overall Cognitive Status: Impaired/Different from baseline Area of Impairment: Memory     Memory: Decreased short-term memory              Exercises      General Comments        Pertinent Vitals/Pain Pain Assessment: No/denies pain SpO2 at rest 98% room air SpO2 during ambulation 93% room air SpO2 upon end of session 95% room air    Home Living                      Prior Function            PT Goals (current goals can now be found in the care plan section) Progress towards PT goals: Progressing toward goals    Frequency  Min 3X/week    PT Plan Current plan remains appropriate    Co-evaluation             End of Session Equipment Utilized During Treatment: Gait belt Activity Tolerance: Patient limited by fatigue Patient left: in chair;with call bell/phone within reach;with chair alarm set;with family/visitor present     Time: 1400-1417 PT Time Calculation (min): 17 min  Charges:  $Gait Training: 8-22 mins                    G Codes:      Emilliano Dilworth,KATHrine E 04-22-2014, 3:11 PM Carmelia Bake, PT, DPT 04-22-14 Pager: [phone]

## 2014-03-31 NOTE — Progress Notes (Signed)
ANTICOAGULATION CONSULT NOTE - Follow Up  Pharmacy Consult for warfarin Indication: atrial fibrillation  No Known Allergies  Patient Measurements: Height: 5\' 6"  (167.6 cm) Weight: 136 lb 0.4 oz (61.7 kg) IBW/kg (Calculated) : 59.3  Vital Signs: Temp: 97.5 F (36.4 C) (09/03 0901) Temp src: Oral (09/03 0901) BP: 139/83 mmHg (09/03 0901) Pulse Rate: 64 (09/03 0901)  Labs:  Recent Labs  03/29/14 0415 03/30/14 0415 03/30/14 1133 03/31/14 0425  HGB 11.7* 12.2  --  12.9  HCT 36.2 37.5  --  40.4  PLT 258 297  --  334  LABPROT 34.4* 40.6*  --  38.6*  INR 3.41* 4.21*  --  3.95*  CREATININE 0.80 0.74  --  0.78  TROPONINI  --   --  <0.30  --     Estimated Creatinine Clearance: 46.4 ml/min (by C-G formula based on Cr of 0.78).   Medications:  Scheduled:  . atorvastatin  40 mg Oral Daily  . carvedilol  3.125 mg Oral BID WC  . cefTRIAXone (ROCEPHIN)  IV  1 g Intravenous Q24H  . citalopram  20 mg Oral Daily  . furosemide  40 mg Oral Daily  . letrozole  2.5 mg Oral Daily  . levothyroxine  112 mcg Oral QAC breakfast  . sodium chloride  500 mL Intravenous Once  . sodium chloride  3 mL Intravenous Q12H  . Warfarin - Pharmacist Dosing Inpatient   Does not apply q1800    Assessment: 80 yo female admitted from [facility] secondary to weakness, abd pain, N/V and has not eaten since biopsy 8/27. CT with possible ileus and patient also with hypokalemia and dehydration. Note that patient had CT biopsy on 8/27 to evaluate lung mass and is on warfarin long term for hx Afib. Patient with hx breast cancer and evaluating lung mass for primary lung cancer vs metastases from previous cancer. Patient had been off of warfarin x 8 days as of 8/27 and INR was 1.6 which they elected to proceed with biopsy on 8/27. Per patient's daughter, patient resumed warfarin on Friday 8/28. Patient reportedly on warfarin 4mg  daily except 6mg  on Thursdays with last dose of 4mg  being yesterday, 8/30.  Warfarin Doses  Given as Inpatient 8/31: 4mg  9/1: none 9/2: none  9/3:   INR 3.95 is supratherapeutic, but now decreasing (warfarin held since 9/1)  No reported bleeding or complications.  CBC stable  No significant DDI's noted  Diet: Advanced to full liquids, ileus improved on 9/1 xray.  Goal of Therapy:  INR 2-3   Plan:   NO warfarin today due to INR > 3  Daily INR   Gretta Arab PharmD, BCPS Pager [phone] 03/31/2014 1:55 PM

## 2014-03-31 NOTE — Progress Notes (Signed)
PROGRESS NOTE  Leah Guerra DOB: [birth year] DOA: 03/28/2014 PCP:  Melinda Crutch, MD  HPI: Leah Guerra is a 80 y.o. female has a past medical history significant for remote history of breast cancer, mild dementia, prior lung nodules and pleural effusion on left, tapped int he past and negative for malignancy, diastolic CHF, HTN, A fib on coumadin, HLD, presents to the ED complaining of abdominal pain, nausea and vomiting, found to have an ileus.  Subjective/ 24 H Interval events Had 2 bowel movements since yesterday. Improved. Still INR in the high side.  Assessment/Plan:  Ileus -Cannot rule out the recent use of narcotic pain medications as cause. -On clears, repeated abdominal film showed improvement of the colonic ileus. -Had 2 bowel movements yesterday. Advance diet to soft diet.  Metastatic carcinoma  -Lung biopsy 4 days ago shows features c/w metastatic carcinoma, and stains for HER, Estrogen and progesterone are pending.  -Oncology following.  Chronic diastolic heart failure -EF ~50% in 2014. Elevated BNP, careful with fluids, she is clinically without crackles or JVD  -IV Lasix this morning  HTN - resume home medications   Possible UTI  -Urinalysis with bacteria, negative leukocytes or nitrites.  -Has leukocytosis and overall weakness, negative urine culture, discontinue ceftriaxone.  Bibasilar pleural effusions - monitor clinically, persistent, worrisome for malignancy. Repeat Lasix again today.  Hypokalemia - poor po intake, on Lasix. Replete orally.   Hypothyroidism - TSH 4.8, continue current regimen.   A fib  -Rate controlled, on Coumadin. She discontinued Coumadin last week in anticipation for biopsy but resumed.  -Continue coumadin per pharmacy.   Cardiac pause -Had a1.99 second pause on 9/2. I reviewed the telemetry. -She is patient does have atrial flutter/fibrillation with variable conduction. -Patient is on Cardizem and  Coreg, discontinue the Cardizem. Note that patient is DNR/DNI.  Diet: clear liquid  Fluids: none DVT Prophylaxis: coumadin  Code Status: DNR Family Communication:  Disposition Plan: inpatient, home when ready, PT evaluation pending  Consultants:  Oncology   Procedures:  None    Antibiotics Ceftriaxone 8/31 >>9/2  Studies  Filed Vitals:   03/31/14 0500 03/31/14 0506 03/31/14 0901 03/31/14 1248  BP:  143/79 139/83 104/47  Pulse:  71 64 65  Temp:  97.9 F (36.6 C) 97.5 F (36.4 C) 97.5 F (36.4 C)  TempSrc:  Oral Oral Oral  Resp:  18 18 18   Height:      Weight: 61.7 kg (136 lb 0.4 oz)     SpO2:  93% 94% 95%    Intake/Output Summary (Last 24 hours) at 03/31/14 1445 Last data filed at 03/31/14 1440  Gross per 24 hour  Intake   1040 ml  Output    400 ml  Net    640 ml   Filed Weights   03/28/14 1243 03/30/14 1758 03/31/14 0500  Weight: 64.1 kg (141 lb 5 oz) 62.4 kg (137 lb 9.1 oz) 61.7 kg (136 lb 0.4 oz)   Exam:  General:  NAD  Cardiovascular: RRR  Respiratory: mild bilateral crackles  Abdomen: soft, non tender  MSK: no edema  Neuro: non focal  Data Reviewed: Basic Metabolic Panel:  Recent Labs Lab 03/28/14 0840 03/29/14 0415 03/30/14 0415 03/30/14 1133 03/31/14 0425  NA 141 139 139  --  138  K 3.0* 3.7 3.2*  --  4.2  CL 99 101 100  --  99  CO2 27 26 26   --  26  GLUCOSE 132* 91 91  --  94  BUN 18 12 11   --  9  CREATININE 0.90 0.80 0.74  --  0.78  CALCIUM 9.7 8.6 8.5  --  8.6  MG  --   --   --  2.0  --    Liver Function Tests:  Recent Labs Lab 03/28/14 0840 03/29/14 0415  AST 45* 36  ALT 26 21  ALKPHOS 111 105  BILITOT 0.9 0.6  PROT 8.3 7.0  ALBUMIN 3.2* 2.6*    Recent Labs Lab 03/28/14 0840  LIPASE 42   CBC:  Recent Labs Lab 03/28/14 0840 03/29/14 0415 03/30/14 0415 03/31/14 0425  WBC 12.2* 7.7 7.5 7.2  NEUTROABS 9.8*  --   --   --   HGB 13.3 11.7* 12.2 12.9  HCT 40.9 36.2 37.5 40.4  MCV 88.5 88.9 86.6 87.1    PLT 301 258 297 334   Cardiac Enzymes:  Recent Labs Lab 03/28/14 0840 03/30/14 1133  TROPONINI <0.30 <0.30   BNP (last 3 results)  Recent Labs  03/28/14 0840  PROBNP 3172.0*   Studies: No results found.  Scheduled Meds: . atorvastatin  40 mg Oral Daily  . carvedilol  3.125 mg Oral BID WC  . cefTRIAXone (ROCEPHIN)  IV  1 g Intravenous Q24H  . citalopram  20 mg Oral Daily  . furosemide  40 mg Oral Daily  . letrozole  2.5 mg Oral Daily  . levothyroxine  112 mcg Oral QAC breakfast  . polyethylene glycol  17 g Oral Daily  . sodium chloride  500 mL Intravenous Once  . sodium chloride  3 mL Intravenous Q12H  . Warfarin - Pharmacist Dosing Inpatient   Does not apply q1800   Continuous Infusions:   Active Problems:   Chronic atrial fibrillation   Long term (current) use of anticoagulants   Hypertension   Lung nodule with a history of breats cancer- followed by Dr [name]   SOB (shortness of breath)   Chronic respiratory failure with hypoxia   Pleural effusion on left   Chronic diastolic heart failure   Ileus   Unspecified hypothyroidism  Time spent: 35  This note has been created with Surveyor, quantity. Any transcriptional errors are unintentional.   Birdie Hopes, MD  [facility] Pager [phone]. If 7 PM - 7 AM, please contact night-coverage at [email or web address], password [facility] 03/31/2014, 2:45 PM  LOS: 3 days

## 2014-04-01 DIAGNOSIS — E039 Hypothyroidism, unspecified: Secondary | ICD-10-CM

## 2014-04-01 LAB — CBC
HEMATOCRIT: 41 % (ref 36.0–46.0)
Hemoglobin: 12.9 g/dL (ref 12.0–15.0)
MCH: 28 pg (ref 26.0–34.0)
MCHC: 31.5 g/dL (ref 30.0–36.0)
MCV: 89.1 fL (ref 78.0–100.0)
Platelets: 266 10*3/uL (ref 150–400)
RBC: 4.6 MIL/uL (ref 3.87–5.11)
RDW: 15.4 % (ref 11.5–15.5)
WBC: 6.8 10*3/uL (ref 4.0–10.5)

## 2014-04-01 LAB — BASIC METABOLIC PANEL
Anion gap: 14 (ref 5–15)
BUN: 11 mg/dL (ref 6–23)
CO2: 22 meq/L (ref 19–32)
CREATININE: 0.79 mg/dL (ref 0.50–1.10)
Calcium: 8.7 mg/dL (ref 8.4–10.5)
Chloride: 99 mEq/L (ref 96–112)
GFR calc Af Amer: 84 mL/min — ABNORMAL LOW (ref >90)
GFR calc non Af Amer: 73 mL/min — ABNORMAL LOW (ref >90)
GLUCOSE: 90 mg/dL (ref 70–99)
Potassium: 4.5 mEq/L (ref 3.7–5.3)
Sodium: 135 mEq/L — ABNORMAL LOW (ref 137–147)

## 2014-04-01 LAB — PROTIME-INR
INR: 2.32 — ABNORMAL HIGH (ref 0.00–1.49)
Prothrombin Time: 25.5 seconds — ABNORMAL HIGH (ref 11.6–15.2)

## 2014-04-01 MED ORDER — LEVOTHYROXINE SODIUM 125 MCG PO TABS: 125.0000 ug | ORAL_TABLET | Freq: Every day | ORAL | Status: DC

## 2014-04-01 MED ORDER — POLYETHYLENE GLYCOL 3350 17 G PO PACK: 17.0000 g | PACK | Freq: Every day | ORAL | Status: DC

## 2014-04-01 MED ORDER — WARFARIN SODIUM 2 MG PO TABS: 2.0000 mg | ORAL_TABLET | Freq: Every day | ORAL | Status: DC

## 2014-04-01 MED ORDER — WARFARIN SODIUM 2 MG PO TABS
2.0000 mg | ORAL_TABLET | Freq: Every day | ORAL | Status: DC
Start: 2014-04-01 — End: 2014-04-01

## 2014-04-01 MED ORDER — WARFARIN SODIUM 2 MG PO TABS
2.0000 mg | ORAL_TABLET | Freq: Once | ORAL | Status: DC
  Filled 2014-04-01: qty 1

## 2014-04-01 NOTE — Discharge Summary (Addendum)
Physician Discharge Summary  Delvina Mizzell [facility] LZJ:[id number] DOB: Jul 01, 1925 DOA: 03/28/2014  PCP:  Melinda Crutch, MD  Admit date: 03/28/2014 Discharge date: 04/01/2014  Time spent: 40 minutes  Recommendations for Outpatient Follow-up:  1. Followup with Dr. Harrington Challenger on 9/8 at 3 PM. 2. Check INR on 9/7 or 9/8. 3. Followup with Dr. [name] within one week for discussion of the biopsy results.  Discharge Diagnoses:  Active Problems:   Chronic atrial fibrillation   Long term (current) use of anticoagulants   Hypertension   Lung nodule with a history of breats cancer- followed by Dr [name]   SOB (shortness of breath)   Chronic respiratory failure with hypoxia   Pleural effusion on left   Chronic diastolic heart failure   Ileus   Unspecified hypothyroidism   Discharge Condition: Stable  Diet recommendation: Heart healthy  Filed Weights   03/30/14 1758 03/31/14 0500 04/01/14 0531  Weight: 62.4 kg (137 lb 9.1 oz) 61.7 kg (136 lb 0.4 oz) 62.234 kg (137 lb 3.2 oz)    History of present illness:  Leah Guerra is a 80 y.o. female has a past medical history significant for remote history of breast cancer, mild dementia, prior lung nodules and pleural effusion on left, tapped int he past and negative for malignancy, diastolic CHF, HTN, A fib on coumadin, HLD, presents to the ED complaining of abdominal pain, nausea and vomiting. Patient is not entirely sure why she is on the hospital, so history is per chart and daughter [name]. Patient has had right lung nodule biopsy on the 27th in interventional radiology. Following the procedure, patient has been complaining of poor wellbeing and of pain at the biopsy site. She had around some old Hydrocodone that the patient has used in the past and patient took for couple of days a half tablet intermittently for pain. Patient was without appetite, worsening abdominal discomfort and had an episode of nausea and vomiting yesterday. No fevers or  chills, no chest pain or breathing difficulties reported. She has been able to drink fluids but unable to eat due to nausea. In the ED patient underwent a CT scan which shows findings c/w ileus and TRH asked for admission.   Hospital Course:   Ileus  -Cannot rule out the recent use of narcotic pain medications as cause.  -Started initially on clear liquids, repeat abdominal film next day showed improvement. -Patient had several bowel movements prior to discharge. -I have asked her to take MiraLax every day and hold if she has diarrhea.  Supratherapeutic INR -Patient presented to the hospital with INR 4.2, Coumadin held. -Unclear to me why her INR was that high, appreciate pharmacist help. -2 mg of Coumadin daily on discharge, check INR on Monday or Tuesday. -Patient needs evaluation to decide to continue Coumadin or to hold it, she does have dementia and presented with supratherapeutic INR. -Unclear if she was taking more Coumadin than she supposed to.  Metastatic carcinoma  -Lung biopsy on 8/27 shows features c/w metastatic carcinoma. -This is consistent with metastatic breast cancer with positive estrogen/progesterone, HER-2 not detected -Seen by Dr. [name] in the hospital and she needs to followup as outpatient.  Chronic diastolic heart failure  -LV EF is around 50% in 2014, has slightly elevated BNP. No symptoms or signs of decompensation. -Lasix continued.  A fib  -Rate controlled, on Coumadin. She discontinued Coumadin last week in anticipation for biopsy but resumed.  -Continue coumadin per pharmacy. -Patient has bradycardia with pauses, continue Coreg and  discontinue Cardizem.  HTN - resume home medications   Possible UTI  -Urinalysis with bacteria, negative leukocytes or nitrites.  -Has leukocytosis and overall weakness, negative urine culture, discontinue ceftriaxone.   Bibasilar pleural effusions  -Monitor clinically, persistent, worrisome for malignancy. Repeat  Lasix again today.   Hypokalemia - poor po intake, on Lasix. Replete orally.   Hypothyroidism -TSH 4.8, continue current regimen.   Cardiac pause  -Had a 1.99 second pause on 9/2, initially staff for followup this is a sinus pause. I reviewed the telemetry.  -She is patient does have atrial flutter/fibrillation with variable conduction.  -Patient was on Cardizem and Coreg, discontinued the Cardizem. Note that patient is DNR/DNI.      Procedures:  None  Consultations:  Oncology  Discharge Exam: Filed Vitals:   04/01/14 1408  BP: 139/84  Pulse: 72  Temp: 98.1 F (36.7 C)  Resp: 16   General: Alert and awake, oriented x3, not in any acute distress. HEENT: anicteric sclera, pupils reactive to light and accommodation, EOMI CVS: S1-S2 clear, no murmur rubs or gallops Chest: clear to auscultation bilaterally, no wheezing, rales or rhonchi Abdomen: soft nontender, nondistended, normal bowel sounds, no organomegaly Extremities: no cyanosis, clubbing or edema noted bilaterally Neuro: Cranial nerves II-XII intact, no focal neurological deficits  Discharge Instructions You were cared for by a hospitalist during your hospital stay. If you have any questions about your discharge medications or the care you received while you were in the hospital after you are discharged, you can call the unit and asked to speak with the hospitalist on call if the hospitalist that took care of you is not available. Once you are discharged, your primary care physician will handle any further medical issues. Please note that NO REFILLS for any discharge medications will be authorized once you are discharged, as it is imperative that you return to your primary care physician (or establish a relationship with a primary care physician if you do not have one) for your aftercare needs so that they can reassess your need for medications and monitor your lab values.  Discharge Instructions   Diet - low sodium  heart healthy    Complete by:  As directed      Increase activity slowly    Complete by:  As directed           Current Discharge Medication List    START taking these medications   Details  polyethylene glycol (MIRALAX / GLYCOLAX) packet Take 17 g by mouth daily. Qty: 14 each, Refills: 0      CONTINUE these medications which have CHANGED   Details  levothyroxine (SYNTHROID, LEVOTHROID) 125 MCG tablet Take 1 tablet (125 mcg total) by mouth daily before breakfast. Qty: 30 tablet, Refills: 0    warfarin (COUMADIN) 2 MG tablet Take 1 tablet (2 mg total) by mouth daily at 6 PM. Qty: 30 tablet, Refills: 0      CONTINUE these medications which have NOT CHANGED   Details  atorvastatin (LIPITOR) 40 MG tablet Take 1 tablet (40 mg total) by mouth daily. Qty: 90 tablet, Refills: 1    carvedilol (COREG) 3.125 MG tablet Take 3.125 mg by mouth 2 (two) times daily with a meal.    citalopram (CELEXA) 20 MG tablet Take 20 mg by mouth daily.     furosemide (LASIX) 40 MG tablet Take 40 mg by mouth daily.    HYDROcodone-acetaminophen (NORCO/VICODIN) 5-325 MG per tablet Take 0.5 tablets by mouth every 6 (six)  hours as needed (back pain).    zolpidem (AMBIEN) 5 MG tablet Take 5 mg by mouth at bedtime as needed for sleep.      STOP taking these medications     diltiazem (DILACOR XR) 180 MG 24 hr capsule        No Known Allergies Follow-up Information   Follow up with  Melinda Crutch, MD On 04/05/2014. (At 3 PM)    Specialty:  Family Medicine   Contact information:   [address] [place] [address] [phone]       Follow up with Betsy Coder, MD In 1 week.   Specialty:  Oncology   Contact information:   [place] [address] [phone]       Follow up with [facility]. (HHPT/AIDE.)    Contact information:   [place] [address] [phone]        The results of significant diagnostics from this  hospitalization (including imaging, microbiology, ancillary and laboratory) are listed below for reference.    Significant Diagnostic Studies: Dg Chest 1 View  03/24/2014   CLINICAL DATA:  Post biopsy  EXAM: CHEST - 1 VIEW  COMPARISON:  PET-CT dated 03/11/2014  FINDINGS: No pneumothorax is seen.  Nodular opacity in the right mid/ lower lung. Subpleural nodular opacity along the left lower hemithorax.  Small bilateral pleural effusions, left greater than right.  Underlying chronic interstitial markings/ emphysematous changes.  Postsurgical changes in the left chest wall/ axilla.  Mild cardiomegaly.  IMPRESSION: No pneumothorax is seen.  Nodular opacity in the right mid/lower lung. Subpleural nodule opacity along the left lower hemithorax.  Small bilateral pleural effusions, left greater than right.   Electronically Signed   By: Julian Hy M.D.   On: 03/24/2014 12:34   Dg Chest 2 View  03/28/2014   CLINICAL DATA:  Generalized weakness, fatigue, loss of appetite, abdominal pain, lung nodule, history hypertension, CHF, breast cancer  EXAM: CHEST  2 VIEW  COMPARISON:  03/24/2014  FINDINGS: Enlargement of cardiac silhouette.  Atherosclerotic calcification aorta.  BILATERAL upper lobe volume loss and scarring with superior retraction of the hila.  Slight prominence of central pulmonary arteries question pulmonary hypertension.  Severe COPD changes with bibasilar effusions atelectasis.  Nodular focus in lateral lower RIGHT chest proximally 2.8 cm in greatest size unchanged.  No definite acute infiltrate or pneumothorax.  Bones demineralized.  IMPRESSION: Severe COPD changes with BILATERAL upper lobe scarring/volume loss, bibasilar pleural effusions and bibasilar atelectasis.  Persistent nodular density lower lateral RIGHT chest.  Enlargement of cardiac silhouette.  No new abnormalities.   Electronically Signed   By: Lavonia Dana M.D.   On: 03/28/2014 08:45   Ct Abdomen Pelvis W Contrast  03/28/2014    CLINICAL DATA:  Abdominal pain. Anorexia. Abdominal distention and dilated bowel loops seen radiographically  EXAM: CT ABDOMEN AND PELVIS WITH CONTRAST  TECHNIQUE: Multidetector CT imaging of the abdomen and pelvis was performed using the standard protocol following bolus administration of intravenous contrast.  CONTRAST:  178m OMNIPAQUE IOHEXOL 300 MG/ML  SOLN  COMPARISON:  PET-CT on 03/11/2014  FINDINGS: Liver: Hepatic steatosis noted. A small less than 1 cm hypervascular lesion is seen in the posterior right hepatic lobe on image 25 which is indeterminate. Differential diagnosis includes flash filling hemangioma and hypervascular liver metastasis. No other definite liver masses are identified.  Gallbladder/Biliary:  Unremarkable.  Pancreas: No mass, inflammatory changes, or other parenchymal abnormality identified.  Spleen:  Within normal limits in size and appearance.  Adrenal Glands:  No mass identified.  Kidneys/Urinary Tract: No masses identified. No evidence of hydronephrosis.  Lymph Nodes: Mild upper abdominal lymphadenopathy seen in the gastrohepatic ligament measuring 10 mm on image 20 and in the right retrocrural region measuring 12 mm on image 16. No other pathologically enlarged lymph nodes identified within the abdomen or pelvis.  Bowel/Peritoneum: Gaseous distention is seen involving nondependent portion of colon, consistent with mild colonic ileus. No evidence of bowel obstruction.  Pelvic/Reproductive Organs: No mass or other significant abnormality identified.  Vascular:  No evidence of abdominal aortic aneurysm.  Musculoskeletal: No suspicious bone lesions identified. Left hip prosthesis noted.  Other: Moderate bilateral pleural effusions show increased in size. Right lower lung nodules again demonstrated, as better seen on recent PET-CT.  IMPRESSION: Colonic ileus pattern.  No evidence of bowel obstruction.  Mild increase in size of moderate bilateral pleural effusions. Right lower lung nodules  again noted, as better evaluated on recent PET-CT.  Stable mild lymphadenopathy in gastrohepatic ligament and right retrocrural region, consistent with metastatic disease.  Sub-cm hypervascular lesion in the posterior right hepatic lobe, which is indeterminate. Differential diagnosis includes flash filling hemangioma and hypervascular liver metastasis. Consider abdomen MRI without and with contrast for further evaluation.   Electronically Signed   By: Earle Gell M.D.   On: 03/28/2014 10:14   Nm Pet Image Initial (pi) Skull Base To Thigh  03/11/2014   CLINICAL DATA:  Initial treatment strategy for enlarging lung nodule and adenopathy ; remote history breast cancer.  EXAM: NUCLEAR MEDICINE PET SKULL BASE TO THIGH  TECHNIQUE: 7.3 mCi F-18 FDG was injected intravenously. Full-ring PET imaging was performed from the skull base to thigh after the radiotracer. CT data was obtained and used for attenuation correction and anatomic localization.  FASTING BLOOD GLUCOSE:  Value: 96 mg/dl  COMPARISON:  Multiple exams, including 02/28/2014 and 09/02/2003  FINDINGS: NECK  No hypermetabolic lymph nodes in the neck.  CHEST  Malignant activity pulmonary nodules, pleural nodularity, and mediastinal and hilar adenopathy noted on the right. Findings include a 1.7 cm precarinal nodule with maximum standard uptake value 6.8, a 1.3 cm subcarinal node with maximum standard uptake value 7.3, a tiny right hilar node with maximum standard uptake value 4.1, bandlike nodularity along the minor fissure with posterior component having maximum standard uptake value of 5.0 and anterior component with maximum standard uptake value 4.9 ; right lower lobe posterior nodules with the dominant 1.8 cm nodule having maximum standard uptake value of 5.1 ; a pleural nodule adjacent to the medial left eleventh rib measuring 7 mm in diameter with a maximum standard uptake value of 4.9. In addition there is a subtle left internal mammary lymph node with  maximum standard uptake value 2.8.  Bilateral pleural effusions. Stable left apical volume loss/consolidation. High activity observed focally in the distal thoracic esophagus, possibly within the esophagus itself or in a paraesophageal node, maximum standard uptake value 8.8.  ABDOMEN/PELVIS  At the hiatus, a 1.1 cm short axis lymph node has maximum standard uptake value 8.0. In the gastrohepatic ligament, a 0.8 cm short axis lymph node has maximum standard uptake value 5.4. Indistinctly marginated right periaortic lymph node adjacent to the hemidiaphragmatic crus in the upper abdomen has maximum standard uptake value of 4.0, mildly hypermetabolic.  Faint activity in a 1.2 cm right inguinal lymph node, maximum standard uptake value 2.4. Faint activity in a left inguinal lymph node measuring 1.5 cm  in short axis, maximum standard uptake value 2.2.  SKELETON  No focal hypermetabolic activity to suggest skeletal metastasis.  IMPRESSION: 1. Malignant level activity observed an right-sided pulmonary nodules, right pleural nodularity, mediastinal and right hilar adenopathy, gastrohepatic ligament adenopathy, and upper right periaortic adenopathy. Probable involvement of a left internal mammary lymph node. Equivocal involvement of single bilateral inguinal lymph nodes.   Electronically Signed   By: Sherryl Barters M.D.   On: 03/11/2014 16:03   Ct Biopsy  03/24/2014   CLINICAL DATA:  80 year old female with an enlarging lung nodule and a multifocal adenopathy. She does have a remote history of breast cancer. She is initially scheduled for a biopsy last week but had an elevated INR of 2.3 secondary to anticoagulation with Coumadin. Today, her INR is 1.6 which is slightly above are threshold for a solid organ biopsy. There is a small (7 mm) the subpleural soft tissue nodule which is hypermetabolic on the prior PET-CT adjacent to the neck of a lower right rib. We can approach this small nodule with very little risk of  bleeding. This was discussed with the patient and her family who agreed to proceed.  EXAM: CT BIOPSY  Date: 03/24/2014  PROCEDURE: 1. CT-guided biopsy of small PET positive subpleural nodule Interventional Radiologist:  Criselda Peaches, MD  ANESTHESIA/SEDATION: Moderate (conscious) sedation was used. 0.25 mg Versed, 12.5 mcg Fentanyl were administered intravenously. The patient's vital signs were monitored continuously by radiology nursing throughout the procedure.  Sedation Time: 16 minutes  TECHNIQUE: Informed consent was obtained from the patient following explanation of the procedure, risks, benefits and alternatives. The patient understands, agrees and consents for the procedure. All questions were addressed. A time out was performed.  A planning axial CT scan was performed. The small nodule corresponding with the hypermetabolic focus in the right posteromedial subpleural space was successfully identified. The region was sterilely prepped and draped in the usual fashion using Betadine skin prep.  Local anesthesia was attained by infiltration with 1% lidocaine. Under CT fluoroscopic guidance, a 17 gauge trocar needle was advanced through a 10 a few mm of the small soft tissue nodule. Multiple 18 gauge core biopsies were then obtained using the BioPince automated biopsy device.  The biopsy specimens were placed in formalin and sent to pathology for further analysis. The trocar needle was removed. Post biopsy axial CT imaging demonstrates no evidence of acute complication.  COMPLICATIONS: None immediate  IMPRESSION: CT-guided core biopsy of small (7 mm) subpleural soft tissue nodule in the posteromedial aspect of the right lower thorax. This lesion was targeted secondary to the patient's mild persistent coagulopathy. If this fails to yield diagnostic tissue, consider thoracentesis for cytology versus bronchoscopic biopsy of the mediastinal adenopathy.  Signed,  Criselda Peaches, MD  [facility]  [facility]   Electronically Signed   By: Jacqulynn Cadet M.D.   On: 03/24/2014 17:01   Ct Chest Limited W/o Cm  03/21/2014   CLINICAL DATA:  Remote history of breast cancer, now with enlarging hyper pulmonary and pleural nodules as well as adenopathy worrisome for metastatic disease. Please perform CT-guided biopsy for tissue diagnostic purposes.  EXAM: CT CHEST WITHOUT CONTRAST  TECHNIQUE: Multidetector CT imaging of the chest was performed following the standard protocol without IV contrast.  COMPARISON:  PET-CT - 03/11/2014; chest CT - 02/28/2014  FINDINGS: The patient was positioned prone on the CT table and limited CT imaging was performed focused on the known hypermetabolic approximately  0.8 cm right sided pleural nodule (image 33, series 2) as well as the approximately 1.8 x 1.1 cm hypermetabolic nodule within the right lower lobe (image 30, series 2).  Prior to the initiation of the procedure, I became aware of the patient's persistently elevated INR despite being off of her Coumadin for the past 5 days. As such, the procedure was canceled.  IMPRESSION: No attempt at CT-guided biopsy secondary to patient's persistently elevated INR. The patient will be rescheduled for attempted CT-guided biopsy on Thursday (8/27) of this week.   Electronically Signed   By: Sandi Mariscal M.D.   On: 03/21/2014 14:54   Dg Abd 2 Views  03/28/2014   CLINICAL DATA:  Weakness vis T and abdominal pain  EXAM: ABDOMEN - 2 VIEW  COMPARISON:  None.  FINDINGS: There is mild gaseous distention of the ascending and transverse and proximal descending colons. There is a small amount of gas in the rectum. There is stool in the mid transverse colon. No free extraluminal gas is demonstrated. There are small bilateral pleural effusions blunting the costophrenic angles. The bones are osteopenic and there is mild dextro curvature of the mid lumbar spine. The small bowel is not abnormally  distended.  IMPRESSION: Findings suggest a colonic ileus. There is no evidence of high-grade obstruction nor of perforation.   Electronically Signed   By: David  Martinique   On: 03/28/2014 08:41   Dg Abd Portable 2v  03/29/2014   CLINICAL DATA:  Assess ileus  EXAM: PORTABLE ABDOMEN - 2 VIEW  COMPARISON:  CT 03/28/2014  FINDINGS: Oral contrast material is demonstrated throughout the colon. Slight improvement in mild dilatation of the cecum and ascending colon. No evidence for small bowel dilatation. No evidence for pneumatosis. Cross-table lateral demonstrates no evidence for free intraperitoneal air. Left hip arthroplasty. Lumbar spine degenerative change.  IMPRESSION: Minimal dilatation the cecum and ascending colon, suggestive of mild colonic ileus, improved from prior CT. No evidence for small bowel obstruction.   Electronically Signed   By: Lovey Newcomer M.D.   On: 03/29/2014 11:25    Microbiology: Recent Results (from the past 240 hour(s))  URINE CULTURE     Status: None   Collection Time    03/28/14  9:19 AM      Result Value Ref Range Status   Specimen Description URINE, CATHETERIZED   Final   Special Requests NONE   Final   Culture  Setup Time     Final   Value: 03/28/2014 15:19     Performed at [facility]     Final   Value: NO GROWTH     Performed at [facility]   Culture     Final   Value: NO GROWTH     Performed at [facility]   Report Status 03/29/2014 FINAL   Final     Labs: Basic Metabolic Panel:  Recent Labs Lab 03/28/14 0840 03/29/14 0415 03/30/14 0415 03/30/14 1133 03/31/14 0425 04/01/14 0451  NA 141 139 139  --  138 135*  K 3.0* 3.7 3.2*  --  4.2 4.5  CL 99 101 100  --  99 99  CO2 _0 --  26 22  GLUCOSE 132* 91 91  --  94 90  BUN _1 --  9 11  CREATININE 0.90 0.80 0.74  --  0.78 0.79  CALCIUM 9.7 8.6 8.5  --  8.6 8.7  MG  --   --   --  2.0  --   --    Liver Function Tests:  Recent Labs Lab  03/28/14 0840 03/29/14 0415  AST 45* 36  ALT 26 21  ALKPHOS 111 105  BILITOT 0.9 0.6  PROT 8.3 7.0  ALBUMIN 3.2* 2.6*    Recent Labs Lab 03/28/14 0840  LIPASE 42   No results found for this basename: AMMONIA,  in the last 168 hours CBC:  Recent Labs Lab 03/28/14 0840 03/29/14 0415 03/30/14 0415 03/31/14 0425 04/01/14 0451  WBC 12.2* 7.7 7.5 7.2 6.8  NEUTROABS 9.8*  --   --   --   --   HGB 13.3 11.7* 12.2 12.9 12.9  HCT 40.9 36.2 37.5 40.4 41.0  MCV 88.5 88.9 86.6 87.1 89.1  PLT 301 258 297 334 266   Cardiac Enzymes:  Recent Labs Lab 03/28/14 0840 03/30/14 1133  TROPONINI <0.30 <0.30   BNP: BNP (last 3 results)  Recent Labs  03/28/14 0840  PROBNP 3172.0*   CBG: No results found for this basename: GLUCAP,  in the last 168 hours     Signed:  Jayleene Glaeser A  [facility] 04/01/2014, 2:18 PM

## 2014-04-01 NOTE — Progress Notes (Addendum)
ANTICOAGULATION CONSULT NOTE - Follow Up  Pharmacy Consult for warfarin Indication: atrial fibrillation  No Known Allergies  Patient Measurements: Height: 5\' 6"  (167.6 cm) Weight: 137 lb 3.2 oz (62.234 kg) IBW/kg (Calculated) : 59.3  Vital Signs: Temp: 98.3 F (36.8 C) (09/04 0531) Temp src: Oral (09/04 0531) BP: 143/50 mmHg (09/04 0531) Pulse Rate: 70 (09/04 0531)  Labs:  Recent Labs  03/30/14 0415 03/30/14 1133 03/31/14 0425 04/01/14 0451  HGB 12.2  --  12.9 12.9  HCT 37.5  --  40.4 41.0  PLT 297  --  334 266  LABPROT 40.6*  --  38.6* 25.5*  INR 4.21*  --  3.95* 2.32*  CREATININE 0.74  --  0.78 0.79  TROPONINI  --  <0.30  --   --     Estimated Creatinine Clearance: 46.4 ml/min (by C-G formula based on Cr of 0.79).   Medications:  Scheduled:  . atorvastatin  40 mg Oral Daily  . carvedilol  3.125 mg Oral BID WC  . citalopram  20 mg Oral Daily  . furosemide  40 mg Oral Daily  . letrozole  2.5 mg Oral Daily  . levothyroxine  112 mcg Oral QAC breakfast  . polyethylene glycol  17 g Oral Daily  . sodium chloride  3 mL Intravenous Q12H  . Warfarin - Pharmacist Dosing Inpatient   Does not apply q1800    Assessment: 80 yo female admitted from [facility] secondary to weakness, abd pain, N/V and has not eaten since biopsy 8/27. CT with possible ileus and patient also with hypokalemia and dehydration. Note that patient had CT biopsy on 8/27 to evaluate lung mass and is on warfarin long term for hx Afib. Patient with hx breast cancer and evaluating lung mass for primary lung cancer vs metastases from previous cancer. Patient had been off of warfarin x 8 days as of 8/27 and INR was 1.6 which they elected to proceed with biopsy on 8/27. Per patient's daughter, patient resumed warfarin on Friday 8/28. Patient reportedly on warfarin 4mg  daily except 6mg  on Thursdays with last dose of 4mg  being yesterday, 8/30.  Warfarin Doses Given as Inpatient 8/31: 4mg  9/1: none 9/2: none 9/3:  none  9/4:   INR 2.3, therapeutic after holding x3 days  No reported bleeding or complications.  CBC stable, WNL  No significant DDI's noted  Diet: Advanced to soft diet, ileus improved on 9/1 xray.  Very little PO intake documented.  Goal of Therapy:  INR 2-3   Plan:   Warfarin 2mg  PO tonight at 1800  Conservative dosing due to recent supratherapeutic INRs  Daily INR   Gretta Arab PharmD, BCPS Pager [phone] 04/01/2014 8:14 AM    Addendum:  For discharge, would recommend resuming LOW dose warfarin 2mg  daily with INR recheck on Monday, 9/7.  Gretta Arab PharmD, BCPS Pager [phone] 04/01/2014 9:06 AM

## 2014-04-07 ENCOUNTER — Ambulatory Visit (INDEPENDENT_AMBULATORY_CARE_PROVIDER_SITE_OTHER): Payer: Medicare Other | Admitting: Cardiothoracic Surgery

## 2014-04-07 ENCOUNTER — Encounter: Payer: Self-pay | Admitting: Cardiothoracic Surgery

## 2014-04-07 VITALS — BP 136/88 | HR 70 | Resp 20 | Ht 66.0 in | Wt 137.0 lb

## 2014-04-07 DIAGNOSIS — Z9889 Other specified postprocedural states: Secondary | ICD-10-CM

## 2014-04-07 DIAGNOSIS — Z853 Personal history of malignant neoplasm of breast: Secondary | ICD-10-CM

## 2014-04-07 DIAGNOSIS — R911 Solitary pulmonary nodule: Secondary | ICD-10-CM

## 2014-04-07 NOTE — Progress Notes (Signed)
[address]       [place] [address]             [phone]      Tannie M Kwolek [place] Medical Record #[id number] Date of Birth: 10-01-1924  Referring: Melinda Crutch, MD Primary Care:  Melinda Crutch, MD  Chief Complaint:   POST OP FOLLOW UP  History of Present Illness:     Patient returns after recent lung biopsy, needle. Because of markedly elevated protimes the patient's needle biopsy was delayed.  Ultimately was done confirming metastatic carcinoma of the breast. Following needle biopsy the patient was admitted with weakness and ileus. Subsequently she was discharged home with physical therapy and has been doing relatively well according to her daughter. The patient's dementia is such that she does not remember even having a needle biopsy.      Past Medical History  Diagnosis Date  . Pleural effusion   . Hypertension   . CHF (congestive heart failure)   . Atrial fibrillation   . Hypothyroid   . Breast cancer   . Lung nodule   . Hyperlipidemia      History  Smoking status  . Never Smoker   Smokeless tobacco  . Never Used    History  Alcohol Use No     No Known Allergies  Current Outpatient Prescriptions  Medication Sig Dispense Refill  . atorvastatin (LIPITOR) 40 MG tablet Take 1 tablet (40 mg total) by mouth daily.  90 tablet  1  . carvedilol (COREG) 3.125 MG tablet Take 3.125 mg by mouth 2 (two) times daily with a meal.      . citalopram (CELEXA) 20 MG tablet Take 20 mg by mouth daily.       . furosemide (LASIX) 40 MG tablet Take 40 mg by mouth daily.      Marland Kitchen HYDROcodone-acetaminophen (NORCO/VICODIN) 5-325 MG per tablet Take 0.5 tablets by mouth every 6 (six) hours as needed (back pain).      Marland Kitchen levothyroxine (SYNTHROID, LEVOTHROID) 125 MCG tablet Take 1 tablet (125 mcg total) by mouth daily before breakfast.  30 tablet  0  . polyethylene glycol (MIRALAX / GLYCOLAX) packet Take 17 g by mouth daily.  14 each  0  . warfarin  (COUMADIN) 2 MG tablet Take 1 tablet (2 mg total) by mouth daily at 6 PM.  30 tablet  0  . zolpidem (AMBIEN) 5 MG tablet Take 5 mg by mouth at bedtime as needed for sleep.       No current facility-administered medications for this visit.       Physical Exam: BP 136/88  Pulse 70  Resp 20  Ht 5\' 6"  (1.676 m)  Wt 137 lb (62.143 kg)  BMI 22.12 kg/m2  SpO2 94%  General appearance: alert, cooperative and slowed mentation Neurologic: intact Heart: irregularly irregular rhythm Lungs: diminished breath sounds bibasilar Abdomen: soft, non-tender; bowel sounds normal; no masses,  no organomegaly Extremities: extremities normal, atraumatic, no cyanosis or edema and Homans sign is negative, no sign of DVT The patient is cooperative but has obvious severe memory difficulty.  Diagnostic Studies & Laboratory data:     Recent Radiology Findings:   No results found.    Recent Lab Findings: Lab Results  Component Value Date   WBC 6.8 04/01/2014   HGB 12.9 04/01/2014   HCT 41.0 04/01/2014   PLT 266 04/01/2014   GLUCOSE 90 04/01/2014   ALT 21 03/29/2014   AST  36 03/29/2014   NA 135* 04/01/2014   K 4.5 04/01/2014   CL 99 04/01/2014   CREATININE 0.79 04/01/2014   BUN 11 04/01/2014   CO2 22 04/01/2014   TSH 4.820* 03/28/2014   INR 2.32* 04/01/2014      Assessment / Plan:     The pathology report confirming metastatic breast cancer was discussed with the patient and her daughter. The patient's to see Dr. [name] early next week to discuss therapy, the daughter notes that the patient was given one dose of anti- estrogen medication, but was not given any followup prescription so as not currently on any hormonal therapy.  I plan to see the patient back as needed   Grace Isaac MD      [address] [place] [address] Office [phone]   Beeper [phone]  04/07/2014 2:55 PM

## 2014-04-12 ENCOUNTER — Other Ambulatory Visit: Payer: Self-pay | Admitting: *Deleted

## 2014-04-12 ENCOUNTER — Ambulatory Visit (HOSPITAL_BASED_OUTPATIENT_CLINIC_OR_DEPARTMENT_OTHER): Payer: Medicare Other | Admitting: Oncology

## 2014-04-12 ENCOUNTER — Telehealth: Payer: Self-pay | Admitting: Oncology

## 2014-04-12 VITALS — BP 115/79 | HR 72 | Temp 98.6°F | Resp 19 | Ht 66.0 in | Wt 136.1 lb

## 2014-04-12 DIAGNOSIS — R918 Other nonspecific abnormal finding of lung field: Secondary | ICD-10-CM

## 2014-04-12 DIAGNOSIS — C50919 Malignant neoplasm of unspecified site of unspecified female breast: Secondary | ICD-10-CM

## 2014-04-12 DIAGNOSIS — I4891 Unspecified atrial fibrillation: Secondary | ICD-10-CM

## 2014-04-12 DIAGNOSIS — Z853 Personal history of malignant neoplasm of breast: Secondary | ICD-10-CM

## 2014-04-12 MED ORDER — LETROZOLE 2.5 MG PO TABS: 2.5000 mg | ORAL_TABLET | Freq: Every day | ORAL | Status: AC

## 2014-04-12 NOTE — Progress Notes (Signed)
  [facility] OFFICE PROGRESS NOTE   Diagnosis: Breast cancer  INTERVAL HISTORY:   She was admitted 03/28/2014 with nausea/vomiting and abdominal pain. She was diagnosed with an ileus. Her symptoms resolved during a hospital admission. Biopsy of a lung nodule 03/24/2014 returned consistent with metastatic breast cancer. The tumor returned hormone receptor positive and HER-2 negative. I recommended starting Femara while she was in the hospital. She has not started Femara.  Leah Guerra returns today with her daughter. She has no specific complaint. No dyspnea.  Objective:  Vital signs in last 24 hours:  Blood pressure 136/103, pulse 75, temperature 98.6 F (37 C), temperature source Oral, resp. rate 19, height $RemoveBe'5\' 6"'KsPjxvsLg$  (1.676 m), weight 136 lb 1.6 oz (61.735 kg), SpO2 95.00%.    HEENT: Neck without mass Lymphatics: No cervical, supraclavicular, or axillary nodes Resp: Lungs clear bilaterally, no respiratory distress, decreased breath sounds at the bases, dullness at the posterior lower one third bilaterally Cardio: Irregular GI: No hepatomegaly, nontender Vascular: No leg edema Breasts: Status post left mastectomy. No evidence for chest wall tumor recurrence.  Medications: I have reviewed the patient's current medications.  Assessment/Plan: 1. Stage III-A left-sided breast cancer diagnosed in February of 2000. She was maintained on Femara from February 2005 through November 2008. 2. History of chronic parenchymal lung densities and a borderline elevated CA27.29. Multiple CT scans were negative for evidence of disease progression.  CT of the chest on 02/11/2013 showed a stable right lung nodule.  CT chest 10/28/2013 showed an enlarging spiculated right lower lobe pulmonary nodule; stable mild mediastinal lymphadenopathy; new mild patchy subpleural opacities in the right middle and lower lobes felt likely to be infectious/inflammatory; small bilateral pleural effusions  which were overall stable.  CT chest 02/28/2014 with increase in the size of the right lower lobe nodule; interval increase in size and number of perilymphatic micro-and macro nodules throughout the right lung; worsening of mediastinal adenopathy; slight decrease in small left pleural effusion; small right pleural effusion unchanged. PET scan 03/11/2014 with malignant level activity involving right-sided pulmonary nodules, right pleural nodularity, mediastinal, and right hilar adenopathy there was also evidence of metastatic gastric neck ligament and periaortic adenopathy CT guided biopsy of a right subpleural nodule 03/24/2014 with pathology confirming metastatic carcinoma-ER positive, PR positive, HER-2/neu 3. Left pleural effusion-pleural fluid cytology negative while in [place] in June 2014, status post a diagnostic/therapeutic thoracentesis 02/09/2013-negative for malignancy. 4. Atrial fibrillation. 5. Status post a fall with a left hip fracture June 2014. 6. Memory loss-progressive 7. Hospital admission with an ileus 03/28/2014    Disposition:  Leah Guerra has been diagnosed with metastatic breast cancer. She appears asymptomatic from the breast cancer at present. A staging PET scan reveals multiple hypermetabolic lung nodules and chest/abdomen lymph nodes. I recommend treatment with Femara. We reviewed the potential toxicities associated with Femara. Leah Guerra has progressive dementia. Her daughter will contact us if she develops new symptoms.  She will return for an office visit and chest x-ray in approximately one month. We will consider discontinuing Femara and consolidating her medical regimen if the dementia progresses.  Betsy Coder, MD  04/12/2014  12:33 PM

## 2014-04-12 NOTE — Telephone Encounter (Signed)
gv adn printed appt sched and avs for pt for OCT °

## 2014-04-18 ENCOUNTER — Ambulatory Visit (INDEPENDENT_AMBULATORY_CARE_PROVIDER_SITE_OTHER): Payer: Medicare Other | Admitting: Pharmacist Clinician (PhC)/ Clinical Pharmacy Specialist

## 2014-04-18 DIAGNOSIS — Z7901 Long term (current) use of anticoagulants: Secondary | ICD-10-CM

## 2014-04-18 DIAGNOSIS — I4891 Unspecified atrial fibrillation: Secondary | ICD-10-CM

## 2014-04-18 DIAGNOSIS — I482 Chronic atrial fibrillation, unspecified: Secondary | ICD-10-CM

## 2014-04-18 LAB — POCT INR: INR: 1.6

## 2014-04-28 ENCOUNTER — Telehealth: Payer: Self-pay | Admitting: Oncology

## 2014-04-28 NOTE — Telephone Encounter (Signed)
pt cld to r/s appt-gave pt new time & date-pt understood

## 2014-05-02 ENCOUNTER — Ambulatory Visit (INDEPENDENT_AMBULATORY_CARE_PROVIDER_SITE_OTHER): Payer: Medicare Other | Admitting: Pharmacist Clinician (PhC)/ Clinical Pharmacy Specialist

## 2014-05-02 DIAGNOSIS — I482 Chronic atrial fibrillation, unspecified: Secondary | ICD-10-CM

## 2014-05-02 DIAGNOSIS — Z7901 Long term (current) use of anticoagulants: Secondary | ICD-10-CM

## 2014-05-02 DIAGNOSIS — I4891 Unspecified atrial fibrillation: Secondary | ICD-10-CM

## 2014-05-02 LAB — POCT INR: INR: 1.3

## 2014-05-13 ENCOUNTER — Ambulatory Visit (INDEPENDENT_AMBULATORY_CARE_PROVIDER_SITE_OTHER): Payer: Medicare Other | Admitting: Pharmacist Clinician (PhC)/ Clinical Pharmacy Specialist

## 2014-05-13 DIAGNOSIS — I4891 Unspecified atrial fibrillation: Secondary | ICD-10-CM

## 2014-05-13 DIAGNOSIS — I482 Chronic atrial fibrillation, unspecified: Secondary | ICD-10-CM

## 2014-05-13 DIAGNOSIS — Z7901 Long term (current) use of anticoagulants: Secondary | ICD-10-CM

## 2014-05-13 LAB — POCT INR: INR: 3.8

## 2014-05-17 ENCOUNTER — Ambulatory Visit: Payer: Medicare Other | Admitting: Nurse Practitioner

## 2014-05-30 ENCOUNTER — Ambulatory Visit (INDEPENDENT_AMBULATORY_CARE_PROVIDER_SITE_OTHER): Payer: Medicare Other | Admitting: Pharmacist Clinician (PhC)/ Clinical Pharmacy Specialist

## 2014-05-30 ENCOUNTER — Ambulatory Visit (HOSPITAL_COMMUNITY)
Admission: RE | Admit: 2014-05-30 | Discharge: 2014-05-30 | Disposition: A | Discharge status: Home / Self Care | Payer: Medicare Other | Source: Ambulatory Visit | Attending: Oncology | Admitting: Oncology

## 2014-05-30 DIAGNOSIS — I482 Chronic atrial fibrillation, unspecified: Secondary | ICD-10-CM

## 2014-05-30 DIAGNOSIS — I4891 Unspecified atrial fibrillation: Secondary | ICD-10-CM

## 2014-05-30 DIAGNOSIS — J90 Pleural effusion, not elsewhere classified: Secondary | ICD-10-CM | POA: Insufficient documentation

## 2014-05-30 DIAGNOSIS — C50919 Malignant neoplasm of unspecified site of unspecified female breast: Secondary | ICD-10-CM | POA: Diagnosis not present

## 2014-05-30 DIAGNOSIS — R918 Other nonspecific abnormal finding of lung field: Secondary | ICD-10-CM | POA: Diagnosis present

## 2014-05-30 DIAGNOSIS — Z7901 Long term (current) use of anticoagulants: Secondary | ICD-10-CM

## 2014-05-30 LAB — POCT INR: INR: 1.4

## 2014-05-31 ENCOUNTER — Telehealth: Payer: Self-pay | Admitting: Oncology

## 2014-05-31 ENCOUNTER — Ambulatory Visit (HOSPITAL_BASED_OUTPATIENT_CLINIC_OR_DEPARTMENT_OTHER): Payer: Medicare Other | Admitting: Nurse Practitioner

## 2014-05-31 VITALS — BP 132/75 | HR 90 | Temp 97.8°F | Resp 16 | Wt 140.3 lb

## 2014-05-31 DIAGNOSIS — J90 Pleural effusion, not elsewhere classified: Secondary | ICD-10-CM

## 2014-05-31 DIAGNOSIS — I4891 Unspecified atrial fibrillation: Secondary | ICD-10-CM

## 2014-05-31 DIAGNOSIS — R911 Solitary pulmonary nodule: Secondary | ICD-10-CM

## 2014-05-31 DIAGNOSIS — C50919 Malignant neoplasm of unspecified site of unspecified female breast: Secondary | ICD-10-CM

## 2014-05-31 DIAGNOSIS — Z853 Personal history of malignant neoplasm of breast: Secondary | ICD-10-CM

## 2014-05-31 NOTE — Telephone Encounter (Signed)
gv relative appt schedule for nov. pt to have cxr on day on f/u (12/15) relative aware. Also pt not able to stay for lab today and will return tomorrow 06/01/14).

## 2014-05-31 NOTE — Progress Notes (Signed)
[place] OFFICE PROGRESS NOTE   Diagnosis:  Breast cancer  INTERVAL HISTORY:   Leah Guerra returns as scheduled. She continues Femara. No hot flashes. She intermittently complains of bilateral knee pain. This tends to occur with prolonged sitting. She denies shortness of breath. No fever or cough. She has a good appetite. Her daughter thinks Leah Guerra's memory may be slightly worse.  Objective:  Vital signs in last 24 hours:  Blood pressure 132/75, pulse 90, temperature 97.8 F (36.6 C), resp. rate 16, weight 140 lb 4.8 oz (63.64 kg).    HEENT: no thrush or ulcers. Lymphatics: no palpable cervical, supraclavicular or axillary lymph nodes. Resp: lungs clear bilaterally. Cardio: irregular. GI: abdomen soft and nontender. No hepatomegaly. Vascular: very minimal lower leg edema bilaterally. Neuro:alert. Mild confusion.  Breasts: status post left mastectomy. No evidence for chest wall tumor recurrence.    Lab Results:  Lab Results  Component Value Date   WBC 6.8 04/01/2014   HGB 12.9 04/01/2014   HCT 41.0 04/01/2014   MCV 89.1 04/01/2014   PLT 266 04/01/2014   NEUTROABS 9.8* 03/28/2014    Imaging:  Dg Chest 2 View  05/30/2014   CLINICAL DATA:  Followup lung nodules and pleural effusions. History of breast cancer.  EXAM: CHEST  2 VIEW  COMPARISON:  03/28/2014  FINDINGS: Chronic cardiomegaly. Stable appearance of the hila, with left suprahilar fullness again noted. Small bilateral pleural effusions, left larger than right, similar to previous. An ovoid nodule in the peripheral right lung base is similar in size, approximately 28 mm in length. There is a linear opacity in the left chest which likely represents scar, stable from previous. The density appears decreased from prior, although this could be technical. Unchanged biapical pleural parenchymal scarring. Status post left axillary dissection.  IMPRESSION: 1. Persistent pulmonary nodule on the  right. 2. Unchanged small pleural effusions bilaterally.   Electronically Signed   By: Jorje Guild M.D.   On: 05/30/2014 13:16    Medications: I have reviewed the patient's current medications.  Assessment/Plan: 1. Stage III-A left-sided breast cancer diagnosed in February of 2000. She was maintained on Femara from February 2005 through November 2008. 2. History of chronic parenchymal lung densities and a borderline elevated CA27.29. Multiple CT scans were negative for evidence of disease progression.  CT of the chest on 02/11/2013 showed a stable right lung nodule.   CT chest 10/28/2013 showed an enlarging spiculated right lower lobe pulmonary nodule; stable mild mediastinal lymphadenopathy; new mild patchy subpleural opacities in the right middle and lower lobes felt likely to be infectious/inflammatory; small bilateral pleural effusions which were overall stable.   CT chest 02/28/2014 with increase in the size of the right lower lobe nodule; interval increase in size and number of perilymphatic micro-and macro nodules throughout the right lung; worsening of mediastinal adenopathy; slight decrease in small left pleural effusion; small right pleural effusion unchanged.  PET scan 03/11/2014 with malignant level activity involving right-sided pulmonary nodules, right pleural nodularity, mediastinal, and right hilar adenopathy there was also evidence of metastatic gastric neck ligament and periaortic adenopathy  CT guided biopsy of a right subpleural nodule 03/24/2014 with pathology confirming metastatic carcinoma-ER positive, PR positive, HER-2/neu negative.  Initiation of Femara 04/12/2014.  Chest x-ray 05/30/2014 with a persistent pulmonary nodule on the right and unchanged small bilateral pleural effusions. 3. Left pleural effusion-pleural fluid cytology negative while in [place] in June 2014, status post a diagnostic/therapeutic thoracentesis 02/09/2013-negative for  malignancy. 4. Atrial  fibrillation. 5. Status post a fall with a left hip fracture June 2014. 6. Memory loss-progressive 7. Hospital admission with an ileus 03/28/2014   Disposition: Leah Guerra appears stable. She continues Femara. We will have her return to the lab today for a baseline CA 27-29. She will return for a followup visit, chest x-ray and CA 27-29 in 6 weeks. She or her daughter will contact the office in the interim with any problems.  Plan reviewed with Dr. [name].    Ned Card ANP/GNP-BC   05/31/2014  9:55 AM

## 2014-06-01 ENCOUNTER — Other Ambulatory Visit: Payer: Medicare Other

## 2014-06-01 DIAGNOSIS — R911 Solitary pulmonary nodule: Secondary | ICD-10-CM

## 2014-06-01 DIAGNOSIS — C50919 Malignant neoplasm of unspecified site of unspecified female breast: Secondary | ICD-10-CM

## 2014-06-01 LAB — CANCER ANTIGEN 27.29: CA 27.29: 64 U/mL — ABNORMAL HIGH (ref 0–39)

## 2014-06-13 ENCOUNTER — Ambulatory Visit: Payer: Medicare Other | Admitting: Pharmacist Clinician (PhC)/ Clinical Pharmacy Specialist

## 2014-06-14 IMAGING — US US THORACENTESIS ASP PLEURAL SPACE W/IMG GUIDE
1 series · 6 of 6 positions shown · non-contrast
Comparison: None

CLINICAL DATA: Bilateral pleural effusions; left larger than right

ULTRASOUND GUIDED left THORACENTESIS

[Series 1: us thoracentesis asp pleural space w/img guide · 0.31mm/px · 6 of 6 slices shown]
[im 1/6]
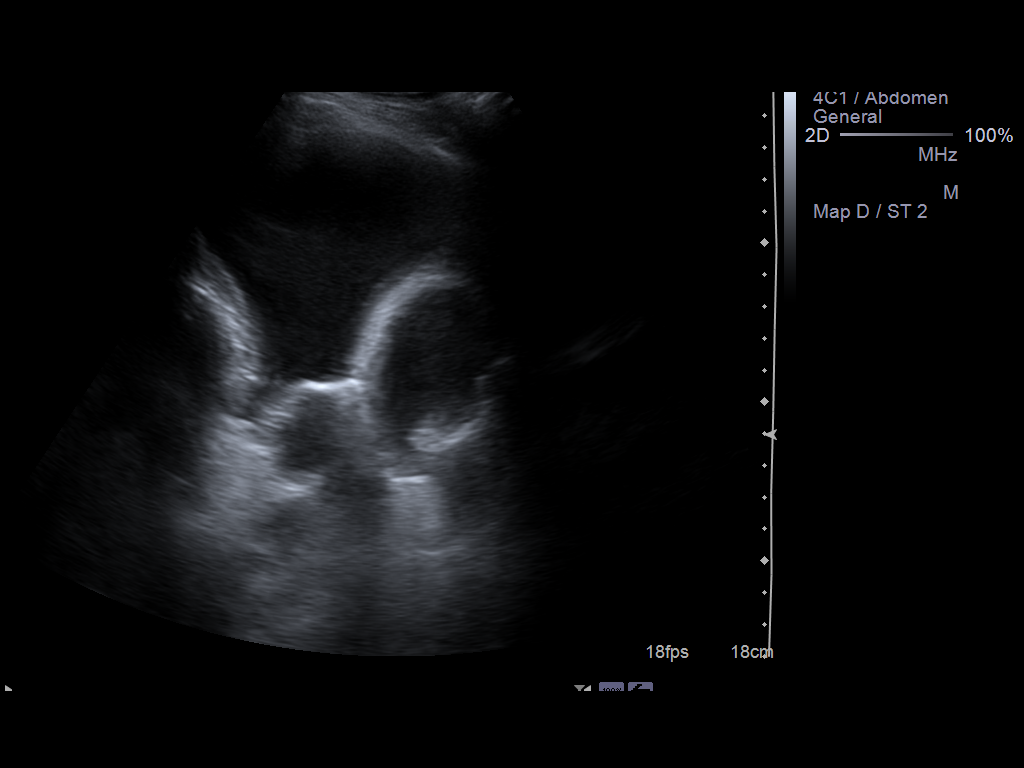
[im 2/6]
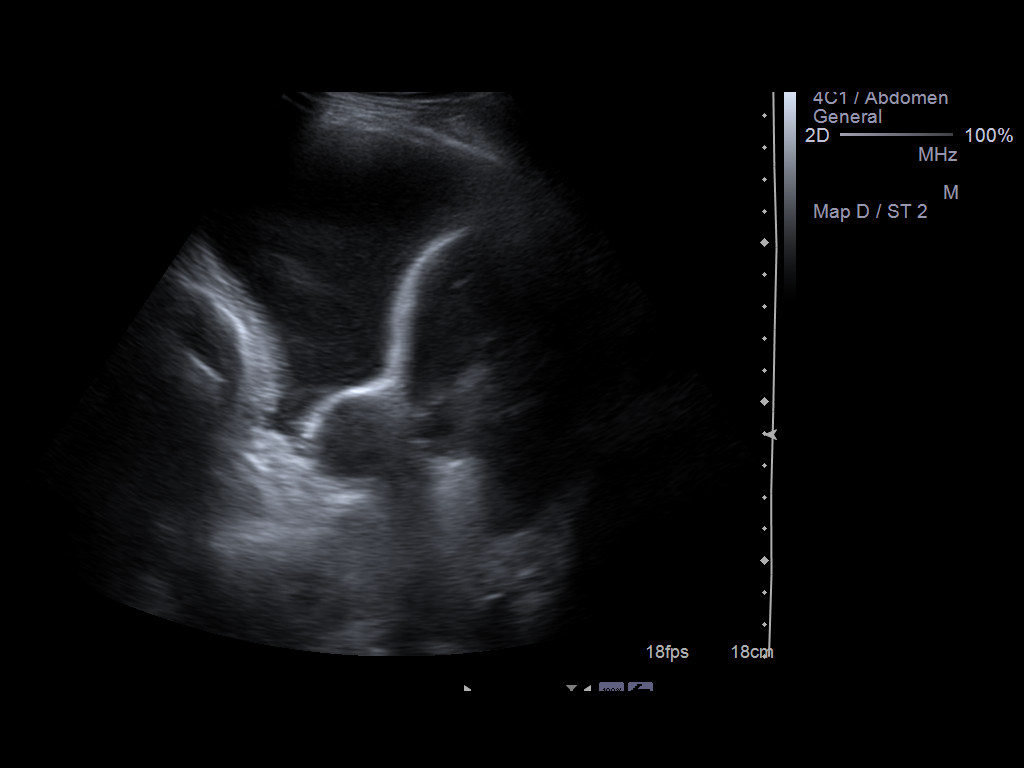
[im 3/6]
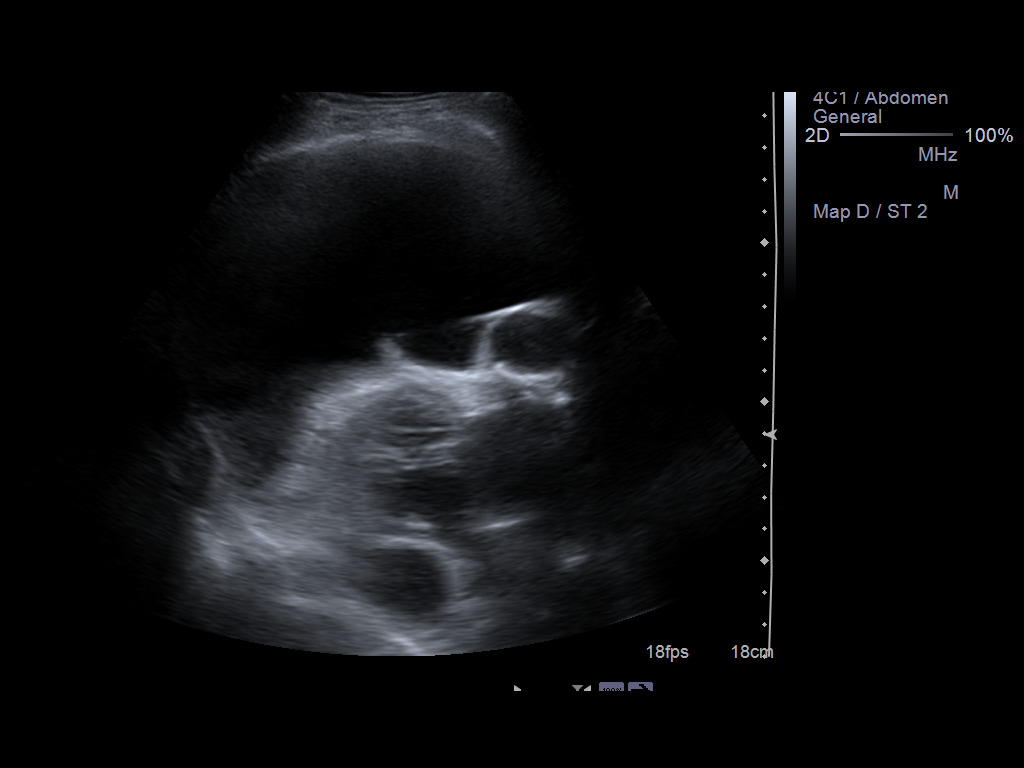
[im 4/6]
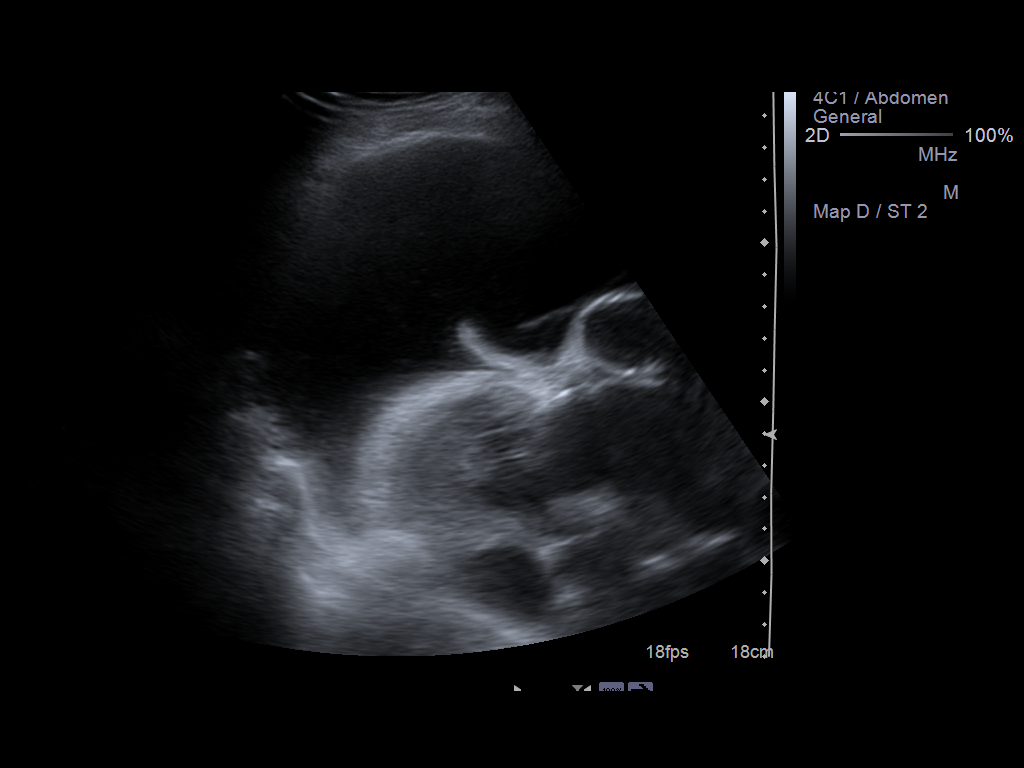
[im 5/6]
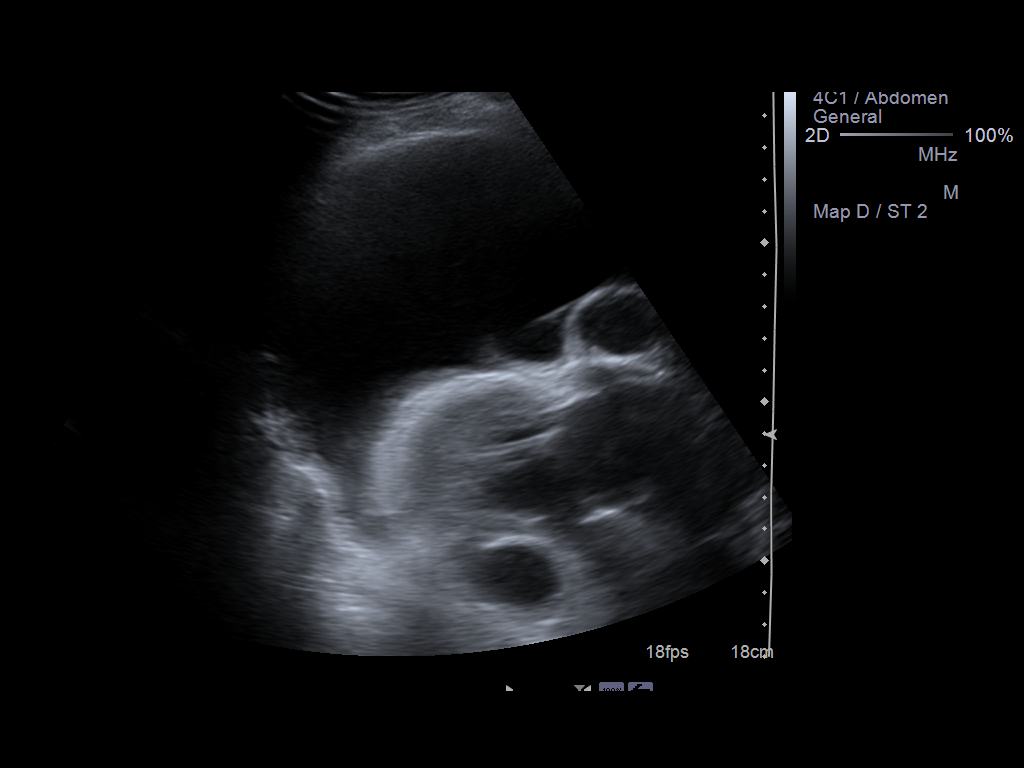
[im 6/6]
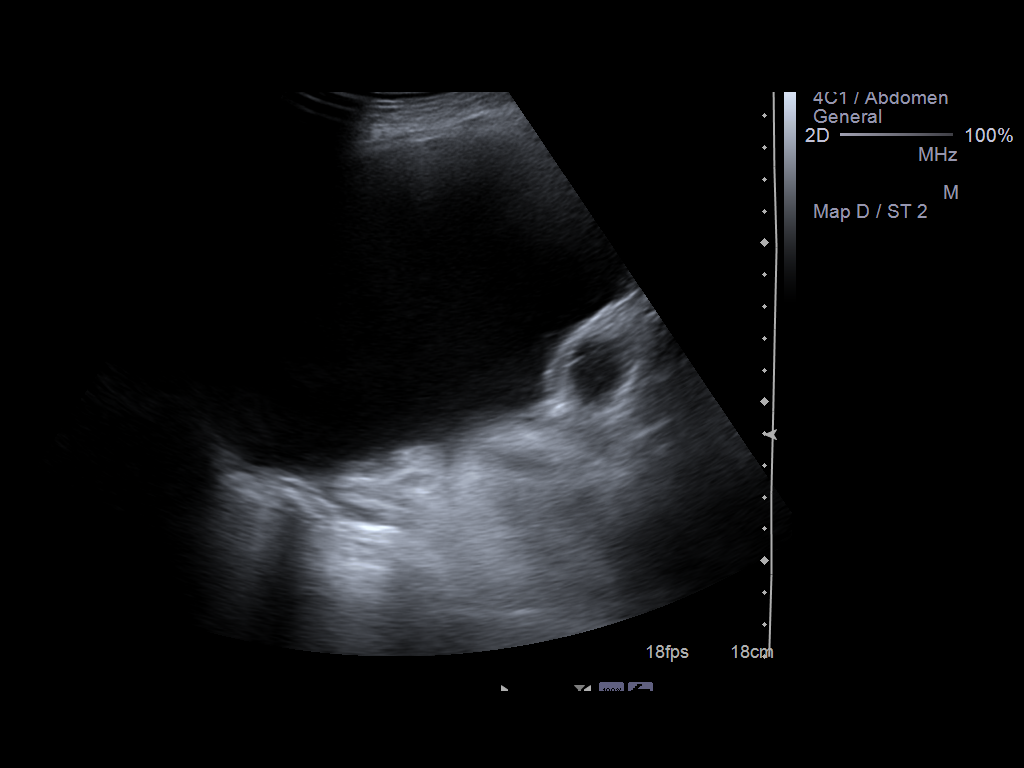

[6 of 6 positions shown; findings below may reference images not displayed]

An ultrasound guided thoracentesis was thoroughly discussed with
the patient and questions answered.  The benefits, risks,
alternatives and complications were also discussed.  The patient
understands and wishes to proceed with the procedure.  Written
consent was obtained.

Ultrasound was performed to localize and mark an adequate pocket of
fluid in the left chest.  The area was then prepped and draped in
the normal sterile fashion.  1% Lidocaine was used for local
anesthesia.  Under ultrasound guidance a 19 gauge Yueh catheter was
introduced.  Thoracentesis was performed.  The catheter was removed
and a dressing applied.

Complications:  None
FINDINGS: A total of approximately 1 liter of yellow fluid was
removed. A fluid sample was sent for laboratory analysis.
IMPRESSION: Successful ultrasound guided left thoracentesis yielding 1 liter of
pleural fluid.

Pt started coughing; needed to stop procedure.

Read by: Wien, Julkifli.-DAYADAY

## 2014-06-14 IMAGING — CR DG CHEST 1V
1 series · 1 of 1 positions shown · non-contrast
Comparison: 02/08/2013

CLINICAL DATA: Left pleural effusion, status post thoracentesis.

CHEST - 1 VIEW

[x chest ap]
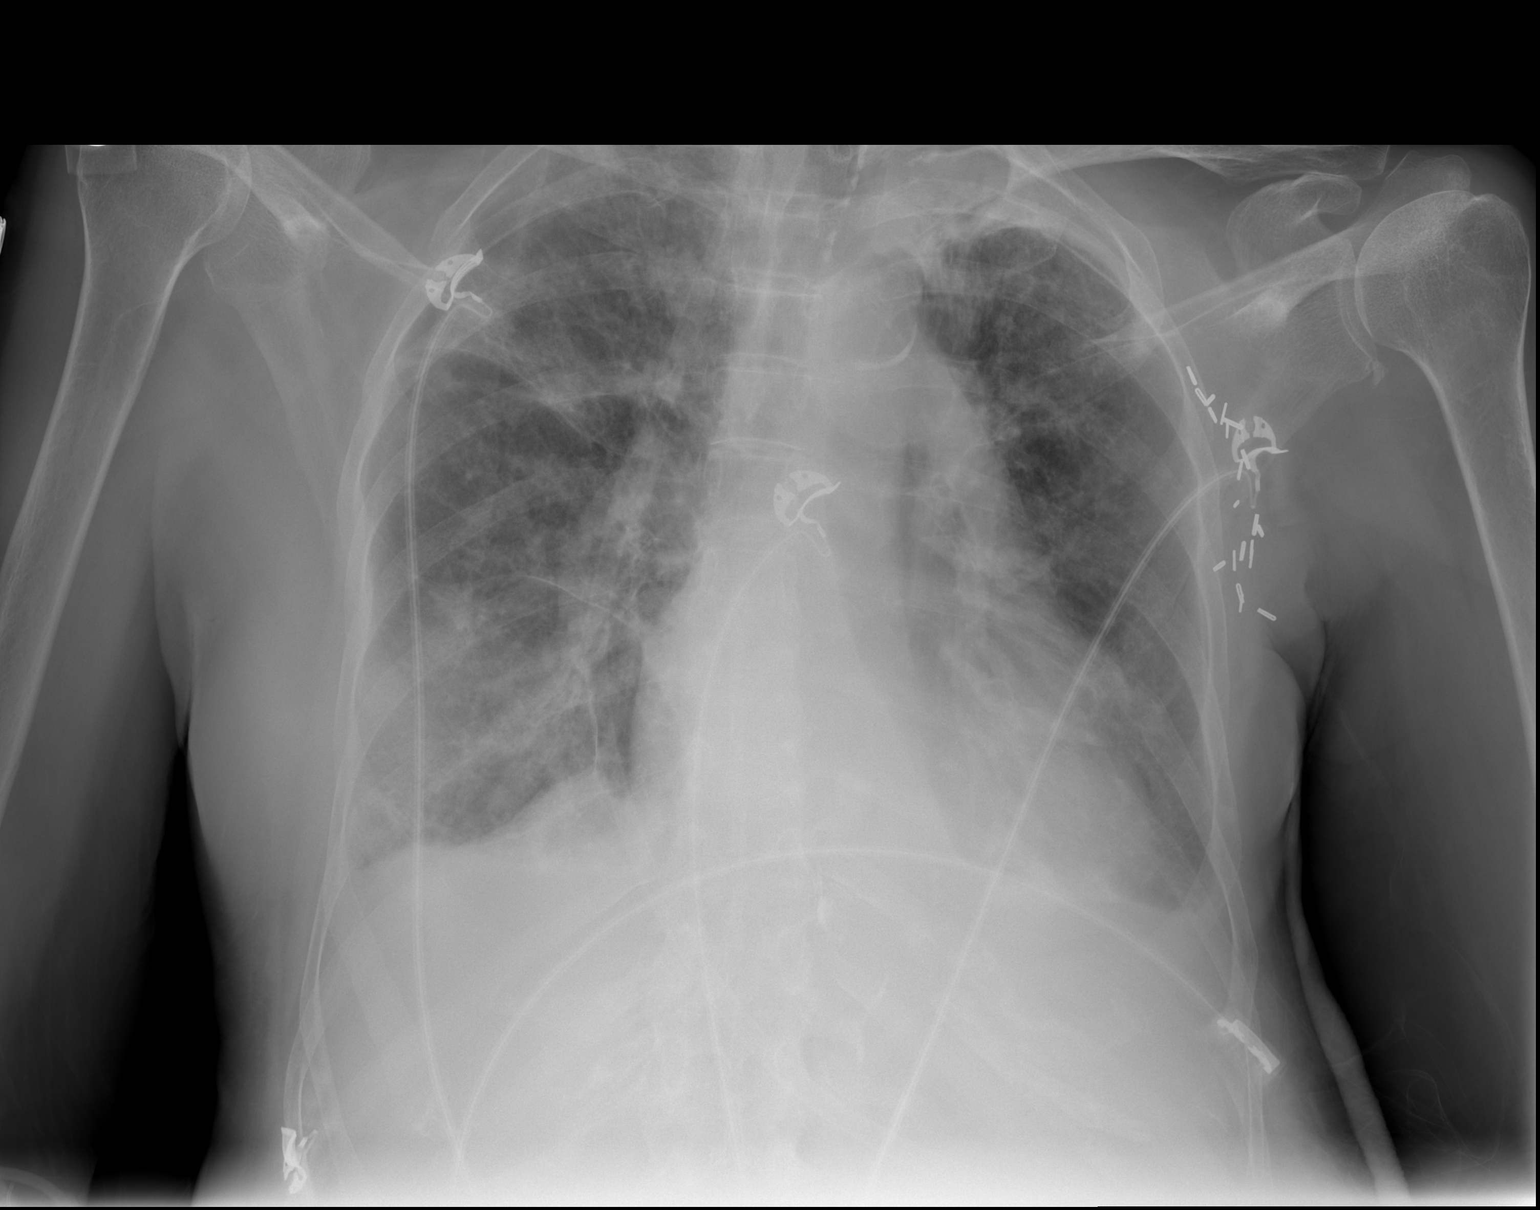

[1 of 1 positions shown; findings below may reference images not displayed]

FINDINGS: Significant reduction in size of left pleural fluid
collection.  Residual density at the left lung apex and left lung
base.  Underlying cardiomegaly noted.

Compared to yesterday, there is increased interstitial and patchy
airspace opacity favoring edema.  Nodularity in the right mid lung
persists.
IMPRESSION: 1.  Reduced size left pleural fluid collection, without
pneumothorax.
2.  Compared to yesterday, there is increased interstitial and
patchy airspace edema.
3.  Nodularity in the right midlung- malignancy is not excluded.
4.  Mild cardiomegaly.

## 2014-06-27 ENCOUNTER — Ambulatory Visit (INDEPENDENT_AMBULATORY_CARE_PROVIDER_SITE_OTHER): Payer: Medicare Other | Admitting: Pharmacist Clinician (PhC)/ Clinical Pharmacy Specialist

## 2014-06-27 DIAGNOSIS — I482 Chronic atrial fibrillation, unspecified: Secondary | ICD-10-CM

## 2014-06-27 DIAGNOSIS — Z7901 Long term (current) use of anticoagulants: Secondary | ICD-10-CM

## 2014-06-27 DIAGNOSIS — I4891 Unspecified atrial fibrillation: Secondary | ICD-10-CM

## 2014-06-27 LAB — POCT INR: INR: 1.9

## 2014-06-29 ENCOUNTER — Telehealth: Payer: Self-pay | Admitting: Oncology

## 2014-06-29 NOTE — Telephone Encounter (Signed)
Lvm for pt's daughter [name] advising appt chg from 12/15 to 12/22 @ 12.15pm.

## 2014-07-05 ENCOUNTER — Telehealth: Payer: Self-pay | Admitting: Cardiovascular Disease

## 2014-07-05 ENCOUNTER — Telehealth: Payer: Self-pay | Admitting: *Deleted

## 2014-07-05 NOTE — Telephone Encounter (Signed)
Returned call to patient's daughter [name].She stated her mother needs a Hospice Pallative Care Evaluation.Stated she talked to a Hospice nurse and she should qualify for a evaluation given her age and diagnosis of AFib and CHF.Spoke to [employer] he stated he can go out to patient's home to evaluate her, will need Dr.Berry to sign original order for evaluation and other orders will go to PCP.Message sent to [facility] for review.

## 2014-07-05 NOTE — Telephone Encounter (Signed)
Pt's daughter, [name], called requesting hospice referral.  States "her in-home care recommended that she needs hospice"  Per Dr. [name]; asked pt's daughter if any issues/problems at this time.  [name] reports no problems "just going off advice of the home care RN"  Notified [name] that MD will evaluate at next visit re: hospice referral.  [name] verbalized understanding and confirmed appt for 07/19/14.

## 2014-07-05 NOTE — Telephone Encounter (Signed)
Happy to sign the order but AFIB and CHF probably don't qualify her for Hospice/Palliative Care

## 2014-07-05 NOTE — Telephone Encounter (Signed)
She is trying to get a refferal for [facility] for an evaluation.. Please call,she will give you the details.

## 2014-07-12 ENCOUNTER — Ambulatory Visit: Payer: Medicare Other | Admitting: Oncology

## 2014-07-16 ENCOUNTER — Encounter (HOSPITAL_COMMUNITY): Payer: Self-pay | Admitting: *Deleted

## 2014-07-16 ENCOUNTER — Emergency Department (HOSPITAL_COMMUNITY): Payer: Medicare Other

## 2014-07-16 ENCOUNTER — Observation Stay (HOSPITAL_COMMUNITY)
Admission: EM | Admit: 2014-07-16 | Discharge: 2014-07-19 | Disposition: A | Discharge status: Skilled Nursing Facility | Payer: Medicare Other | Attending: Internal Medicine | Admitting: Internal Medicine

## 2014-07-16 DIAGNOSIS — R9431 Abnormal electrocardiogram [ECG] [EKG]: Secondary | ICD-10-CM

## 2014-07-16 DIAGNOSIS — I4891 Unspecified atrial fibrillation: Secondary | ICD-10-CM | POA: Diagnosis not present

## 2014-07-16 DIAGNOSIS — E039 Hypothyroidism, unspecified: Secondary | ICD-10-CM | POA: Diagnosis present

## 2014-07-16 DIAGNOSIS — Z853 Personal history of malignant neoplasm of breast: Secondary | ICD-10-CM | POA: Insufficient documentation

## 2014-07-16 DIAGNOSIS — E785 Hyperlipidemia, unspecified: Secondary | ICD-10-CM | POA: Diagnosis not present

## 2014-07-16 DIAGNOSIS — R911 Solitary pulmonary nodule: Secondary | ICD-10-CM | POA: Diagnosis not present

## 2014-07-16 DIAGNOSIS — Z66 Do not resuscitate: Secondary | ICD-10-CM | POA: Insufficient documentation

## 2014-07-16 DIAGNOSIS — J90 Pleural effusion, not elsewhere classified: Secondary | ICD-10-CM

## 2014-07-16 DIAGNOSIS — F329 Major depressive disorder, single episode, unspecified: Secondary | ICD-10-CM | POA: Insufficient documentation

## 2014-07-16 DIAGNOSIS — W1830XA Fall on same level, unspecified, initial encounter: Secondary | ICD-10-CM | POA: Diagnosis not present

## 2014-07-16 DIAGNOSIS — I482 Chronic atrial fibrillation, unspecified: Secondary | ICD-10-CM | POA: Diagnosis present

## 2014-07-16 DIAGNOSIS — W19XXXA Unspecified fall, initial encounter: Secondary | ICD-10-CM

## 2014-07-16 DIAGNOSIS — I5032 Chronic diastolic (congestive) heart failure: Secondary | ICD-10-CM

## 2014-07-16 DIAGNOSIS — F0390 Unspecified dementia without behavioral disturbance: Secondary | ICD-10-CM | POA: Diagnosis not present

## 2014-07-16 DIAGNOSIS — I4581 Long QT syndrome: Secondary | ICD-10-CM | POA: Diagnosis not present

## 2014-07-16 DIAGNOSIS — R55 Syncope and collapse: Secondary | ICD-10-CM | POA: Diagnosis present

## 2014-07-16 DIAGNOSIS — Z7901 Long term (current) use of anticoagulants: Secondary | ICD-10-CM

## 2014-07-16 DIAGNOSIS — C50919 Malignant neoplasm of unspecified site of unspecified female breast: Secondary | ICD-10-CM | POA: Insufficient documentation

## 2014-07-16 DIAGNOSIS — Y92000 Kitchen of unspecified non-institutional (private) residence as  the place of occurrence of the external cause: Secondary | ICD-10-CM | POA: Insufficient documentation

## 2014-07-16 DIAGNOSIS — E876 Hypokalemia: Secondary | ICD-10-CM | POA: Insufficient documentation

## 2014-07-16 DIAGNOSIS — I10 Essential (primary) hypertension: Secondary | ICD-10-CM | POA: Diagnosis present

## 2014-07-16 LAB — COMPREHENSIVE METABOLIC PANEL
ALBUMIN: 3.3 g/dL — AB (ref 3.5–5.2)
ALK PHOS: 114 U/L (ref 39–117)
ALT: 9 U/L (ref 0–35)
ANION GAP: 14 (ref 5–15)
AST: 21 U/L (ref 0–37)
BILIRUBIN TOTAL: 0.8 mg/dL (ref 0.3–1.2)
BUN: 10 mg/dL (ref 6–23)
CHLORIDE: 101 meq/L (ref 96–112)
CO2: 29 mEq/L (ref 19–32)
Calcium: 9.5 mg/dL (ref 8.4–10.5)
Creatinine, Ser: 0.81 mg/dL (ref 0.50–1.10)
GFR calc Af Amer: 74 mL/min — ABNORMAL LOW (ref >90)
GFR calc non Af Amer: 63 mL/min — ABNORMAL LOW (ref >90)
Glucose, Bld: 89 mg/dL (ref 70–99)
POTASSIUM: 3.1 meq/L — AB (ref 3.7–5.3)
SODIUM: 144 meq/L (ref 137–147)
Total Protein: 8.1 g/dL (ref 6.0–8.3)

## 2014-07-16 LAB — URINALYSIS, ROUTINE W REFLEX MICROSCOPIC
Bilirubin Urine: NEGATIVE
Glucose, UA: NEGATIVE mg/dL
Ketones, ur: NEGATIVE mg/dL
Nitrite: NEGATIVE
PH: 6.5 (ref 5.0–8.0)
Protein, ur: 100 mg/dL — AB
SPECIFIC GRAVITY, URINE: 1.019 (ref 1.005–1.030)
Urobilinogen, UA: 1 mg/dL (ref 0.0–1.0)

## 2014-07-16 LAB — CBC WITH DIFFERENTIAL/PLATELET
BASOS PCT: 0 % (ref 0–1)
Basophils Absolute: 0 10*3/uL (ref 0.0–0.1)
Eosinophils Absolute: 0.1 10*3/uL (ref 0.0–0.7)
Eosinophils Relative: 2 % (ref 0–5)
HCT: 42.2 % (ref 36.0–46.0)
HEMOGLOBIN: 13.3 g/dL (ref 12.0–15.0)
LYMPHS ABS: 1.4 10*3/uL (ref 0.7–4.0)
Lymphocytes Relative: 22 % (ref 12–46)
MCH: 28.8 pg (ref 26.0–34.0)
MCHC: 31.5 g/dL (ref 30.0–36.0)
MCV: 91.3 fL (ref 78.0–100.0)
MONOS PCT: 7 % (ref 3–12)
Monocytes Absolute: 0.4 10*3/uL (ref 0.1–1.0)
NEUTROS PCT: 69 % (ref 43–77)
Neutro Abs: 4.2 10*3/uL (ref 1.7–7.7)
PLATELETS: 196 10*3/uL (ref 150–400)
RBC: 4.62 MIL/uL (ref 3.87–5.11)
RDW: 14.6 % (ref 11.5–15.5)
WBC: 6.1 10*3/uL (ref 4.0–10.5)

## 2014-07-16 LAB — URINE MICROSCOPIC-ADD ON

## 2014-07-16 LAB — PROTIME-INR
INR: 1.34 (ref 0.00–1.49)
Prothrombin Time: 16.7 seconds — ABNORMAL HIGH (ref 11.6–15.2)

## 2014-07-16 LAB — I-STAT TROPONIN, ED: TROPONIN I, POC: 0.01 ng/mL (ref 0.00–0.08)

## 2014-07-16 LAB — CBG MONITORING, ED: Glucose-Capillary: 71 mg/dL (ref 70–99)

## 2014-07-16 MED ORDER — ZOLPIDEM TARTRATE 5 MG PO TABS
2.5000 mg | ORAL_TABLET | Freq: Every day | ORAL | Status: DC
  Administered 2014-07-18: 2.5 mg via ORAL
  Filled 2014-07-16: qty 1

## 2014-07-16 MED ORDER — CITALOPRAM HYDROBROMIDE 20 MG PO TABS
20.0000 mg | ORAL_TABLET | Freq: Every day | ORAL | Status: DC
  Administered 2014-07-16: 20 mg via ORAL
  Filled 2014-07-16 (×2): qty 1

## 2014-07-16 MED ORDER — SODIUM CHLORIDE 0.9 % IJ SOLN: 3.0000 mL | INTRAMUSCULAR | Status: DC | PRN

## 2014-07-16 MED ORDER — CARVEDILOL 3.125 MG PO TABS
3.1250 mg | ORAL_TABLET | Freq: Two times a day (BID) | ORAL | Status: DC
  Administered 2014-07-16 – 2014-07-18 (×5): 3.125 mg via ORAL
  Filled 2014-07-16 (×8): qty 1

## 2014-07-16 MED ORDER — ACETAMINOPHEN 325 MG PO TABS: 650.0000 mg | ORAL_TABLET | Freq: Four times a day (QID) | ORAL | Status: DC | PRN

## 2014-07-16 MED ORDER — ONDANSETRON HCL 4 MG/2ML IJ SOLN: 4.0000 mg | Freq: Three times a day (TID) | INTRAMUSCULAR | Status: DC | PRN

## 2014-07-16 MED ORDER — POTASSIUM CHLORIDE CRYS ER 20 MEQ PO TBCR
40.0000 meq | EXTENDED_RELEASE_TABLET | Freq: Once | ORAL | Status: AC
  Administered 2014-07-16: 40 meq via ORAL
  Filled 2014-07-16: qty 2

## 2014-07-16 MED ORDER — SODIUM CHLORIDE 0.9 % IV SOLN: 250.0000 mL | INTRAVENOUS | Status: DC | PRN

## 2014-07-16 MED ORDER — LEVOTHYROXINE SODIUM 125 MCG PO TABS
125.0000 ug | ORAL_TABLET | Freq: Every day | ORAL | Status: DC
  Administered 2014-07-17: 125 ug via ORAL
  Filled 2014-07-16 (×2): qty 1

## 2014-07-16 MED ORDER — LETROZOLE 2.5 MG PO TABS
2.5000 mg | ORAL_TABLET | Freq: Every day | ORAL | Status: DC
  Administered 2014-07-16 – 2014-07-19 (×4): 2.5 mg via ORAL
  Filled 2014-07-16 (×4): qty 1

## 2014-07-16 MED ORDER — SODIUM CHLORIDE 0.9 % IJ SOLN
3.0000 mL | Freq: Two times a day (BID) | INTRAMUSCULAR | Status: DC
  Administered 2014-07-16 – 2014-07-19 (×5): 3 mL via INTRAVENOUS

## 2014-07-16 MED ORDER — ACETAMINOPHEN 650 MG RE SUPP: 650.0000 mg | Freq: Four times a day (QID) | RECTAL | Status: DC | PRN

## 2014-07-16 MED ORDER — MORPHINE SULFATE 2 MG/ML IJ SOLN
2.0000 mg | INTRAMUSCULAR | Status: DC | PRN
Start: 2014-07-16 — End: 2014-07-19
  Administered 2014-07-19: 2 mg via INTRAVENOUS
  Filled 2014-07-16: qty 1

## 2014-07-16 MED ORDER — FUROSEMIDE 40 MG PO TABS
40.0000 mg | ORAL_TABLET | Freq: Every day | ORAL | Status: DC
  Administered 2014-07-16 – 2014-07-19 (×4): 40 mg via ORAL
  Filled 2014-07-16 (×5): qty 1

## 2014-07-16 MED ORDER — SODIUM CHLORIDE 0.9 % IJ SOLN
3.0000 mL | Freq: Two times a day (BID) | INTRAMUSCULAR | Status: DC
  Administered 2014-07-16 – 2014-07-18 (×4): 3 mL via INTRAVENOUS

## 2014-07-16 MED ORDER — ATORVASTATIN CALCIUM 40 MG PO TABS
40.0000 mg | ORAL_TABLET | Freq: Every day | ORAL | Status: DC
  Administered 2014-07-16 – 2014-07-19 (×4): 40 mg via ORAL
  Filled 2014-07-16 (×4): qty 1

## 2014-07-16 NOTE — ED Notes (Signed)
IV team at bedside 

## 2014-07-16 NOTE — ED Notes (Signed)
SpO2 while asleep 88% RA. Woke pt and instructed to take deep breaths; only increased to 90% RA. Pt placed on 2 L nasal cannula.

## 2014-07-16 NOTE — ED Provider Notes (Signed)
CSN: [id number]     Arrival date & time 07/16/14  1150 History   First MD Initiated Contact with Patient 07/16/14 1151     Chief Complaint  Patient presents with  . Fall  . Head Injury      HPI Pt from home via [facility] with c/o head injury, laceration to bridge of nose, skin tear to left hand and right wrist, lumbar/sacral back pain, lower rib pain, found on kitchen floor. Pt hx of dementia, lives alone. Last spoken with at 11 am yesterday. Pt in NAD, alert.  Patient's mental status is at baseline according to family Past Medical History  Diagnosis Date  . Pleural effusion   . Hypertension   . CHF (congestive heart failure)   . Atrial fibrillation   . Hypothyroid   . Breast cancer   . Lung nodule   . Hyperlipidemia    Past Surgical History  Procedure Laterality Date  . Joint replacement    . Masectomy Left   . Fracture surgery     History reviewed. No pertinent family history. History  Substance Use Topics  . Smoking status: Never Smoker   . Smokeless tobacco: Never Used  . Alcohol Use: No   OB History    No data available     Review of Systems  Unable to perform ROS     Allergies  Review of patient's allergies indicates no known allergies.  Home Medications   Prior to Admission medications   Medication Sig Start Date End Date Taking? Authorizing Provider  levothyroxine (SYNTHROID, LEVOTHROID) 125 MCG tablet Take 1 tablet (125 mcg total) by mouth daily before breakfast. 04/01/14  Yes Verlee Monte, MD  warfarin (COUMADIN) 2 MG tablet Take 1 tablet (2 mg total) by mouth daily at 6 PM. 04/01/14  Yes Verlee Monte, MD  atorvastatin (LIPITOR) 40 MG tablet Take 1 tablet (40 mg total) by mouth daily. 03/30/13   Lorretta Harp, MD  carvedilol (COREG) 3.125 MG tablet Take 3.125 mg by mouth 2 (two) times daily with a meal.    Historical Provider, MD  citalopram (CELEXA) 20 MG tablet Take 20 mg by mouth daily.  09/04/12   Historical Provider, MD  furosemide (LASIX) 40 MG  tablet Take 40 mg by mouth daily.    Historical Provider, MD  HYDROcodone-acetaminophen (NORCO/VICODIN) 5-325 MG per tablet Take 0.5 tablets by mouth every 6 (six) hours as needed (back pain).    Historical Provider, MD  letrozole (FEMARA) 2.5 MG tablet Take 1 tablet (2.5 mg total) by mouth daily. 04/12/14   Ladell Pier, MD  polyethylene glycol [facility]aveling) packet Take 17 g by mouth daily. 04/01/14   Verlee Monte, MD  zolpidem (AMBIEN) 5 MG tablet Take 5 mg by mouth at bedtime as needed for sleep.    Historical Provider, MD   BP 128/85 mmHg  Pulse 62  Temp(Src) 98.1 F (36.7 C) (Oral)  Resp 18  Ht 5\' 4"  (1.626 m)  Wt 135 lb 5.8 oz (61.4 kg)  BMI 23.22 kg/m2  SpO2 92% Physical Exam  Constitutional: She appears well-developed and well-nourished. No distress.  HENT:  Head: Normocephalic.  Nose:    Eyes: Pupils are equal, round, and reactive to light.  Neck: Spinous process tenderness present.  Cardiovascular: Normal rate and intact distal pulses.   Pulmonary/Chest: No respiratory distress.  Abdominal: Normal appearance. She exhibits no distension. There is no tenderness. There is no rebound.  Musculoskeletal: Normal range of motion.  Left hand: She exhibits no deformity.       Hands: Neurological: She is alert. No cranial nerve deficit. GCS eye subscore is 4. GCS verbal subscore is 5. GCS motor subscore is 6.  Skin: Skin is warm and dry. No rash noted.  Psychiatric: Her behavior is normal.  Nursing note and vitals reviewed.   ED Course  Procedures (including critical care time) Labs Review Labs Reviewed  URINALYSIS, ROUTINE W REFLEX MICROSCOPIC - Abnormal; Notable for the following:    APPearance CLOUDY (*)    Hgb urine dipstick SMALL (*)    Protein, ur 100 (*)    Leukocytes, UA SMALL (*)    All other components within normal limits  COMPREHENSIVE METABOLIC PANEL - Abnormal; Notable for the following:    Potassium 3.1 (*)    Albumin 3.3 (*)    GFR calc non  Af Amer 63 (*)    GFR calc Af Amer 74 (*)    All other components within normal limits  PROTIME-INR - Abnormal; Notable for the following:    Prothrombin Time 16.7 (*)    All other components within normal limits  BASIC METABOLIC PANEL - Abnormal; Notable for the following:    Potassium 3.4 (*)    GFR calc non Af Amer 55 (*)    GFR calc Af Amer 64 (*)    All other components within normal limits  CBC - Abnormal; Notable for the following:    Hemoglobin 11.8 (*)    All other components within normal limits  TSH - Abnormal; Notable for the following:    TSH 15.770 (*)    All other components within normal limits  URINE MICROSCOPIC-ADD ON - Abnormal; Notable for the following:    Bacteria, UA FEW (*)    All other components within normal limits  CBC WITH DIFFERENTIAL  MAGNESIUM  PRO B NATRIURETIC PEPTIDE  I-STAT TROPOININ, ED  CBG MONITORING, ED    Imaging Review Dg Chest 1 View  07/16/2014   CLINICAL DATA:  Fall, congestive heart failure  EXAM: CHEST - 1 VIEW  COMPARISON:  05/30/2014, CT 8 3,015, PET-CT 03/11/2014  FINDINGS: Stable enlarged cardiac silhouette. There are bilateral pleural effusions not changed. There is chronic interstitial lung disease. There is increase in linear opacities the left right lung. There is nodularity in the right lower lobe which is not changed. These correspond to nodule described on comparison CT of 02/28/2014 and PET-CT . Stable consolidation at the left lung apex.  IMPRESSION: 1. Interval increased interstitial edema. 2. Stable cardiomegaly and bilateral pleural effusions. 3. Stable nodularity in the right lower lobe and consolidation in the left apex   Electronically Signed   By: Suzy Bouchard M.D.   On: 07/16/2014 14:44   Dg Pelvis 1-2 Views  07/16/2014   CLINICAL DATA:  Fall; Pain appears to be in hips and pelvis  EXAM: PELVIS - 1-2 VIEW  COMPARISON:  Radiograph 03/29/2014  FINDINGS: Left hip prosthetic noted. No evidence of fracture or  dislocation of left right hip. No pelvic or sacral fracture.  IMPRESSION: No fracture or dislocation.  No pelvic fracture.   Electronically Signed   By: Suzy Bouchard M.D.   On: 07/16/2014 14:41   Ct Head Wo Contrast  07/16/2014   CLINICAL DATA:  Fall, history of breast cancer  EXAM: CT HEAD WITHOUT CONTRAST  CT CERVICAL SPINE WITHOUT CONTRAST  TECHNIQUE: Multidetector CT imaging of the head and cervical spine was performed following the standard protocol without  intravenous contrast. Multiplanar CT image reconstructions of the cervical spine were also generated.  COMPARISON:  None.  FINDINGS: CT HEAD FINDINGS  No evidence of parenchymal hemorrhage or extra-axial fluid collection. No mass lesion, mass effect, or midline shift.  No CT evidence of acute infarction.  Subcortical white matter and periventricular small vessel ischemic changes. Mild intracranial atherosclerosis.  Global cortical atrophy with secondary ventricular prominence.  The visualized paranasal sinuses are essentially clear. The mastoid air cells are unopacified.  No evidence of calvarial fracture.  CT CERVICAL SPINE FINDINGS  Normal cervical lordosis.  No evidence of fracture or dislocation. Vertebral body heights are maintained. Dens appears intact.  No prevertebral soft tissue swelling.  Moderate degenerative changes of the mid cervical spine.  Visualized thyroid is unremarkable.  Stable radiation changes versus pleural parenchymal scarring in the left lung apex. Right apical pleural parenchymal scarring.  IMPRESSION: No evidence of acute intracranial abnormality. Atrophy with small vessel ischemic changes and intracranial atherosclerosis.  No evidence of traumatic injury to the cervical spine. Moderate multilevel degenerative changes.   Electronically Signed   By: Julian Hy M.D.   On: 07/16/2014 14:15   Ct Cervical Spine Wo Contrast  07/16/2014   CLINICAL DATA:  Fall, history of breast cancer  EXAM: CT HEAD WITHOUT CONTRAST   CT CERVICAL SPINE WITHOUT CONTRAST  TECHNIQUE: Multidetector CT imaging of the head and cervical spine was performed following the standard protocol without intravenous contrast. Multiplanar CT image reconstructions of the cervical spine were also generated.  COMPARISON:  None.  FINDINGS: CT HEAD FINDINGS  No evidence of parenchymal hemorrhage or extra-axial fluid collection. No mass lesion, mass effect, or midline shift.  No CT evidence of acute infarction.  Subcortical white matter and periventricular small vessel ischemic changes. Mild intracranial atherosclerosis.  Global cortical atrophy with secondary ventricular prominence.  The visualized paranasal sinuses are essentially clear. The mastoid air cells are unopacified.  No evidence of calvarial fracture.  CT CERVICAL SPINE FINDINGS  Normal cervical lordosis.  No evidence of fracture or dislocation. Vertebral body heights are maintained. Dens appears intact.  No prevertebral soft tissue swelling.  Moderate degenerative changes of the mid cervical spine.  Visualized thyroid is unremarkable.  Stable radiation changes versus pleural parenchymal scarring in the left lung apex. Right apical pleural parenchymal scarring.  IMPRESSION: No evidence of acute intracranial abnormality. Atrophy with small vessel ischemic changes and intracranial atherosclerosis.  No evidence of traumatic injury to the cervical spine. Moderate multilevel degenerative changes.   Electronically Signed   By: Julian Hy M.D.   On: 07/16/2014 14:15     EKG Interpretation   Date/Time:  Saturday July 16 2014 11:55:27 EST Ventricular Rate:  66 PR Interval:    QRS Duration: 70 QT Interval:  638 QTC Calculation: 669 R Axis:   95 Text Interpretation:  Atrial fibrillation Anteroseptal infarct, age  indeterminate Prolonged QT interval No significant change since last  tracing Confirmed by Brendan Gadson  MD, Wisam Siefring [phone]) on 07/16/2014 11:58:34 AM     Patient was admitted to the  hospitalist service MDM   Final diagnoses:  Fall  Syncope, unspecified syncope type        Dot Lanes, MD 07/17/14 [phone]

## 2014-07-16 NOTE — ED Notes (Signed)
CBG 71.  

## 2014-07-16 NOTE — ED Notes (Signed)
Pt from home via [facility] with c/o head injury, laceration to bridge of nose, skin tear to left hand and right wrist, lumbar/sacral back pain, bilateral upper abdomen/lower rib pain, found on kitchen floor.  Pt hx of dementia, lives alone.  Last spoken with at 11 am yesterday.  Pt in NAD, alert.

## 2014-07-16 NOTE — H&P (Signed)
[facility] History and Physical  Leah Guerra [facility] HTD:[id number] DOB: Dec 19, 1924 DOA: 07/16/2014  Referring physician:  PCP:  Melinda Crutch, MD  Specialists:   Chief Complaint: Fall  HPI: Leah Guerra is a 80 y.o. female  With a history of dementia, congestive heart failure, hypertension, pleural effusions, breast cancer, lung nodule that presented to the emergency department after being found on her kitchen floor. Patient unaware of what occurred. Patient's daughter is at bedside and states that she last spoke to her mother yesterday morning. Patient was then found on her kitchen floor. It is unknown whether patient passed out or stumbled. At the time of admission, patient had no complaints. Imaging conducted in the emergency department showed stable findings in her chest, negative pelvic x-ray, negative CT head and cervical CT. [facility] was called for admission.  Review of Systems:  Unable to obtain due to patient's dementia  Past Medical History  Diagnosis Date  . Pleural effusion   . Hypertension   . CHF (congestive heart failure)   . Atrial fibrillation   . Hypothyroid   . Breast cancer   . Lung nodule   . Hyperlipidemia    Past Surgical History  Procedure Laterality Date  . Joint replacement    . Masectomy Left   . Fracture surgery     Social History:  reports that she has never smoked. She has never used smokeless tobacco. She reports that she does not drink alcohol or use illicit drugs. With that home alone. Has family members as well as hired care.  No Known Allergies  Family history Reviewed  Prior to Admission medications   Medication Sig Start Date End Date Taking? Authorizing Provider  levothyroxine (SYNTHROID, LEVOTHROID) 125 MCG tablet Take 1 tablet (125 mcg total) by mouth daily before breakfast. 04/01/14  Yes Verlee Monte, MD  warfarin (COUMADIN) 2 MG tablet Take 1 tablet (2 mg total) by mouth daily at 6 PM. 04/01/14  Yes Verlee Monte, MD    atorvastatin (LIPITOR) 40 MG tablet Take 1 tablet (40 mg total) by mouth daily. 03/30/13   Lorretta Harp, MD  carvedilol (COREG) 3.125 MG tablet Take 3.125 mg by mouth 2 (two) times daily with a meal.    Historical Provider, MD  citalopram (CELEXA) 20 MG tablet Take 20 mg by mouth daily.  09/04/12   Historical Provider, MD  furosemide (LASIX) 40 MG tablet Take 40 mg by mouth daily.    Historical Provider, MD  HYDROcodone-acetaminophen (NORCO/VICODIN) 5-325 MG per tablet Take 0.5 tablets by mouth every 6 (six) hours as needed (back pain).    Historical Provider, MD  letrozole (FEMARA) 2.5 MG tablet Take 1 tablet (2.5 mg total) by mouth daily. 04/12/14   Ladell Pier, MD  polyethylene glycol [facility]) packet Take 17 g by mouth daily. 04/01/14   Verlee Monte, MD  zolpidem (AMBIEN) 5 MG tablet Take 5 mg by mouth at bedtime as needed for sleep.    Historical Provider, MD   Physical Exam: Filed Vitals:   07/16/14 1330  BP:   Pulse: 26  Temp:   Resp: 18     General: Well developed, well nourished, NAD, appears stated age  38: Fitzgerald, ecchymosis and bruising on nasal bridge, above right eye, PERRLA, EOMI, Anicteic Sclera, mucous membranes moist.   Neck: Supple, no JVD, no masses  Cardiovascular: S1 S2 auscultated, irregular  Respiratory: Clear to auscultation bilaterally with equal chest rise  Abdomen: Soft, nontender, nondistended, + bowel sounds  Extremities: warm dry without cyanosis clubbing or edema  Neuro: Awake, alert, however not oriented.  Can follow directions, cranial nerves grossly intact. Strength equal and bilateral in upper and lower extremities  Skin: multiple skin tears a left hand, right wrist, lumbar and sacral back areas, laceration to the bridge of the nose, ecchymosis and bruising on the 4 head  Psych: Pleasant  Labs on Admission:  Basic Metabolic Panel:  Recent Labs Lab 07/16/14 1219  NA 144  K 3.1*  CL 101  CO2 29  GLUCOSE 89  BUN 10   CREATININE 0.81  CALCIUM 9.5   Liver Function Tests:  Recent Labs Lab 07/16/14 1219  AST 21  ALT 9  ALKPHOS 114  BILITOT 0.8  PROT 8.1  ALBUMIN 3.3*   No results for input(s): LIPASE, AMYLASE in the last 168 hours. No results for input(s): AMMONIA in the last 168 hours. CBC:  Recent Labs Lab 07/16/14 1219  WBC 6.1  NEUTROABS 4.2  HGB 13.3  HCT 42.2  MCV 91.3  PLT 196   Cardiac Enzymes: No results for input(s): CKTOTAL, CKMB, CKMBINDEX, TROPONINI in the last 168 hours.  BNP (last 3 results)  Recent Labs  03/28/14 0840  PROBNP 3172.0*   CBG:  Recent Labs Lab 07/16/14 1158  GLUCAP 71    Radiological Exams on Admission: Dg Chest 1 View  07/16/2014   CLINICAL DATA:  Fall, congestive heart failure  EXAM: CHEST - 1 VIEW  COMPARISON:  05/30/2014, CT 8 3,015, PET-CT 03/11/2014  FINDINGS: Stable enlarged cardiac silhouette. There are bilateral pleural effusions not changed. There is chronic interstitial lung disease. There is increase in linear opacities the left right lung. There is nodularity in the right lower lobe which is not changed. These correspond to nodule described on comparison CT of 02/28/2014 and PET-CT . Stable consolidation at the left lung apex.  IMPRESSION: 1. Interval increased interstitial edema. 2. Stable cardiomegaly and bilateral pleural effusions. 3. Stable nodularity in the right lower lobe and consolidation in the left apex   Electronically Signed   By: Suzy Bouchard M.D.   On: 07/16/2014 14:44   Dg Pelvis 1-2 Views  07/16/2014   CLINICAL DATA:  Fall; Pain appears to be in hips and pelvis  EXAM: PELVIS - 1-2 VIEW  COMPARISON:  Radiograph 03/29/2014  FINDINGS: Left hip prosthetic noted. No evidence of fracture or dislocation of left right hip. No pelvic or sacral fracture.  IMPRESSION: No fracture or dislocation.  No pelvic fracture.   Electronically Signed   By: Suzy Bouchard M.D.   On: 07/16/2014 14:41   Ct Head Wo  Contrast  07/16/2014   CLINICAL DATA:  Fall, history of breast cancer  EXAM: CT HEAD WITHOUT CONTRAST  CT CERVICAL SPINE WITHOUT CONTRAST  TECHNIQUE: Multidetector CT imaging of the head and cervical spine was performed following the standard protocol without intravenous contrast. Multiplanar CT image reconstructions of the cervical spine were also generated.  COMPARISON:  None.  FINDINGS: CT HEAD FINDINGS  No evidence of parenchymal hemorrhage or extra-axial fluid collection. No mass lesion, mass effect, or midline shift.  No CT evidence of acute infarction.  Subcortical white matter and periventricular small vessel ischemic changes. Mild intracranial atherosclerosis.  Global cortical atrophy with secondary ventricular prominence.  The visualized paranasal sinuses are essentially clear. The mastoid air cells are unopacified.  No evidence of calvarial fracture.  CT CERVICAL SPINE FINDINGS  Normal cervical lordosis.  No evidence of fracture or dislocation. Vertebral body  heights are maintained. Dens appears intact.  No prevertebral soft tissue swelling.  Moderate degenerative changes of the mid cervical spine.  Visualized thyroid is unremarkable.  Stable radiation changes versus pleural parenchymal scarring in the left lung apex. Right apical pleural parenchymal scarring.  IMPRESSION: No evidence of acute intracranial abnormality. Atrophy with small vessel ischemic changes and intracranial atherosclerosis.  No evidence of traumatic injury to the cervical spine. Moderate multilevel degenerative changes.   Electronically Signed   By: Julian Hy M.D.   On: 07/16/2014 14:15   Ct Cervical Spine Wo Contrast  07/16/2014   CLINICAL DATA:  Fall, history of breast cancer  EXAM: CT HEAD WITHOUT CONTRAST  CT CERVICAL SPINE WITHOUT CONTRAST  TECHNIQUE: Multidetector CT imaging of the head and cervical spine was performed following the standard protocol without intravenous contrast. Multiplanar CT image  reconstructions of the cervical spine were also generated.  COMPARISON:  None.  FINDINGS: CT HEAD FINDINGS  No evidence of parenchymal hemorrhage or extra-axial fluid collection. No mass lesion, mass effect, or midline shift.  No CT evidence of acute infarction.  Subcortical white matter and periventricular small vessel ischemic changes. Mild intracranial atherosclerosis.  Global cortical atrophy with secondary ventricular prominence.  The visualized paranasal sinuses are essentially clear. The mastoid air cells are unopacified.  No evidence of calvarial fracture.  CT CERVICAL SPINE FINDINGS  Normal cervical lordosis.  No evidence of fracture or dislocation. Vertebral body heights are maintained. Dens appears intact.  No prevertebral soft tissue swelling.  Moderate degenerative changes of the mid cervical spine.  Visualized thyroid is unremarkable.  Stable radiation changes versus pleural parenchymal scarring in the left lung apex. Right apical pleural parenchymal scarring.  IMPRESSION: No evidence of acute intracranial abnormality. Atrophy with small vessel ischemic changes and intracranial atherosclerosis.  No evidence of traumatic injury to the cervical spine. Moderate multilevel degenerative changes.   Electronically Signed   By: Julian Hy M.D.   On: 07/16/2014 14:15    EKG: Independently reviewed. Atrial fibrillation, rate 66, prolonged QT  Assessment/Plan  Possible Syncope/fall -Patient will be admitted for observation -Will admit to telemetry and monitor for any arrhythmias (patient does have prolonged QT) -Echocardiogram September 2015 showed an improvement in EF, 55-60%, severe biatrial enlargement, [place] reveal pericardial effusions and elevated LV filling pressures -CT of the head: No evidence of acute intracranial abnormality -Will conduct neuro checks, obtain carotid Doppler -Will consult PT and OT -Will consult social worker for possible placement -Pending UA, TSH,  Mg  Prolonged QT -Will order repeat EKG -Will hold Celexa  Dementia -Stable and according to daughter who is at bedside  Chronic diastolic heart failure -Echocardiogram as listed above -Will monitor daily weights, intake and output -Patient currently appears to be euvolemic and compensated -Continue Lasix and Coreg -Pending BNP  Chronic Pleural Effusions -Stable  Atrial fibrillation with subtherapeutic INR -Due to patient's fall risk, Coumadin will be held -Patient does have some lacerations, therefore Coumadin will be held at this time -Discussed this with the patient's daughter and she agrees although she understands that having history of cancer as well as atrial fib does place patient increased risk of developing blood clots  Breast cancer/lung nodule -Patient follows up with Dr. [name], will notify him of patient's admission -Continue Femara  Hyperlipidemia -Continue statin  Hypothyroidism -Continue Synthroid, will check TSH  Hypokalemia -Likely secondary to diuretics -Will replace and check magnesium level  Depression -Will hold Celexa (due to prolonged QT)  DVT prophylaxis: SCDs  Code Status: DNR  Condition: Guarded  Family Communication: Daughter at bedside. Admission, patients condition and plan of care including tests being ordered have been discussed with the patient's daughter, who indicates understanding and agrees with the plan and Code Status.  Disposition Plan: Admitted for observation  Time spent: 60 minutes  Clemmie Buelna D.O. [facility] Pager [phone]  If 7PM-7AM, please contact night-coverage [email or web address] Password [facility] 07/16/2014, 4:36 PM

## 2014-07-16 NOTE — ED Notes (Signed)
Pt in radiology 

## 2014-07-16 NOTE — ED Notes (Addendum)
IV attempt by 3 RNs. Notified Dr Audie Pinto.

## 2014-07-17 DIAGNOSIS — R55 Syncope and collapse: Secondary | ICD-10-CM

## 2014-07-17 DIAGNOSIS — E785 Hyperlipidemia, unspecified: Secondary | ICD-10-CM

## 2014-07-17 LAB — BASIC METABOLIC PANEL
ANION GAP: 11 (ref 5–15)
BUN: 11 mg/dL (ref 6–23)
CALCIUM: 8.9 mg/dL (ref 8.4–10.5)
CO2: 29 meq/L (ref 19–32)
CREATININE: 0.91 mg/dL (ref 0.50–1.10)
Chloride: 100 mEq/L (ref 96–112)
GFR calc Af Amer: 64 mL/min — ABNORMAL LOW (ref >90)
GFR, EST NON AFRICAN AMERICAN: 55 mL/min — AB (ref >90)
GLUCOSE: 97 mg/dL (ref 70–99)
Potassium: 3.4 mEq/L — ABNORMAL LOW (ref 3.7–5.3)
Sodium: 140 mEq/L (ref 137–147)

## 2014-07-17 LAB — CBC
HCT: 37.7 % (ref 36.0–46.0)
Hemoglobin: 11.8 g/dL — ABNORMAL LOW (ref 12.0–15.0)
MCH: 29.2 pg (ref 26.0–34.0)
MCHC: 31.3 g/dL (ref 30.0–36.0)
MCV: 93.3 fL (ref 78.0–100.0)
PLATELETS: 226 10*3/uL (ref 150–400)
RBC: 4.04 MIL/uL (ref 3.87–5.11)
RDW: 14.8 % (ref 11.5–15.5)
WBC: 5.1 10*3/uL (ref 4.0–10.5)

## 2014-07-17 LAB — MAGNESIUM: MAGNESIUM: 2 mg/dL (ref 1.5–2.5)

## 2014-07-17 LAB — PRO B NATRIURETIC PEPTIDE: Pro B Natriuretic peptide (BNP): 2168 pg/mL — ABNORMAL HIGH (ref 0–450)

## 2014-07-17 LAB — TSH: TSH: 15.77 u[IU]/mL — ABNORMAL HIGH (ref 0.350–4.500)

## 2014-07-17 MED ORDER — SODIUM CHLORIDE 0.9 % IV BOLUS (SEPSIS)
250.0000 mL | Freq: Once | INTRAVENOUS | Status: AC
  Administered 2014-07-17: 250 mL via INTRAVENOUS

## 2014-07-17 MED ORDER — LEVOTHYROXINE SODIUM 125 MCG PO TABS
125.0000 ug | ORAL_TABLET | Freq: Every day | ORAL | Status: DC
  Administered 2014-07-18 – 2014-07-19 (×2): 125 ug via ORAL
  Filled 2014-07-17 (×3): qty 1

## 2014-07-17 MED ORDER — POTASSIUM CHLORIDE 20 MEQ PO PACK
40.0000 meq | PACK | Freq: Two times a day (BID) | ORAL | Status: AC
  Administered 2014-07-17 (×2): 40 meq via ORAL
  Filled 2014-07-17 (×2): qty 2

## 2014-07-17 MED ORDER — LEVOTHYROXINE SODIUM 137 MCG PO TABS
137.0000 ug | ORAL_TABLET | Freq: Every day | ORAL | Status: DC
  Filled 2014-07-17: qty 1

## 2014-07-17 NOTE — Progress Notes (Addendum)
[facility]                                                                              Patient Demographics  Leah Guerra, is a 80 y.o. female, DOB - [birth year], [place] date - 07/16/2014   Admitting Physician Cristal Ford, DO  Outpatient Primary MD for the patient is  Melinda Crutch, MD  LOS - 1   Chief Complaint  Patient presents with  . Fall  . Head Injury      HPI on 07/16/2014 Leah Guerra is a 80 y.o. female with a history of dementia, congestive heart failure, hypertension, pleural effusions, breast cancer, lung nodule that presented to the emergency department after being found on her kitchen floor. Patient unaware of what occurred. Patient's daughter is at bedside and states that she last spoke to her mother yesterday morning. Patient was then found on her kitchen floor. It is unknown whether patient passed out or stumbled. At the time of admission, patient had no complaints. Imaging conducted in the emergency department showed stable findings in her chest, negative pelvic x-ray, negative CT head and cervical CT. [facility] was called for admission.  Assessment & Plan   Possible Syncope/fall -Patient will be admitted for observation -Will admit to telemetry and monitor for any arrhythmias (patient does have prolonged QT) -Echocardiogram September 2015 showed an improvement in EF, 55-60%, severe biatrial enlargement, [place] reveal pericardial effusions and elevated LV filling pressures -CT of the head: No evidence of acute intracranial abnormality -Pending carotid doppler, PT and OT consults -Consulted social worker for possible placement -UA and CXR negative for infection  Prolonged QT -Repeat EKG today shows prolonged QT, however slightly less than 12/19 -Will hold Celexa  Dementia -Stable and according to daughter who is at bedside  Chronic diastolic heart failure -Echocardiogram as listed above -Continue to monitor daily  weights, intake and output -Patient currently appears to be euvolemic and compensated -Continue Lasix and Coreg -BNP 2168, lower than previous.  Patient appears to have a baseline BNP in the 2000 range  Chronic Pleural Effusions -Stable  Atrial fibrillation with subtherapeutic INR -Due to patient's fall risk, Coumadin will be held -Patient does have some lacerations, therefore Coumadin will be held at this time -Discussed this with the patient's daughter and she agrees although she understands that having history of cancer as well as atrial fib does place patient increased risk of developing blood clots  Breast cancer/lung nodule -Patient follows up with Dr. [name], will notify him of patient's admission -Continue Femara  Hyperlipidemia -Continue statin  Hypothyroidism -TSH 15.77 -Continue Synthroid, however will increase the dose slightly to 181mcg -Will check free T4  Hypokalemia -Likely secondary to diuretics, will continue to replace as needed -Magnesium 2  Depression -Will hold Celexa (due to prolonged QT)  Decreased urine output -Patient had to be given 250 mL bolus overnight -Per RN, patient may be incontinent -Will continue to monitor  Code Status: DNR  Family Communication: None at bedside.  Left daughter a message.  Disposition Plan: Admitted, pending PT/OT eval and carotid doppler.  Possible discharge within 24-48hours.  Time Spent in minutes   30 minutes  Procedures  None  Consults   None  DVT Prophylaxis  SCDs  Lab Results  Component Value Date   PLT 226 07/17/2014    Medications  Scheduled Meds: . atorvastatin  40 mg Oral Daily  . carvedilol  3.125 mg Oral BID WC  . furosemide  40 mg Oral Daily  . letrozole  2.5 mg Oral Daily  . levothyroxine  125 mcg Oral QAC breakfast  . potassium chloride  40 mEq Oral BID  . sodium chloride  3 mL Intravenous Q12H  . sodium chloride  3 mL Intravenous Q12H  . zolpidem  2.5 mg Oral QHS    Continuous Infusions:  PRN Meds:.sodium chloride, acetaminophen **OR** acetaminophen, morphine injection, sodium chloride  Antibiotics    Anti-infectives    None      Subjective:   [name] seen and examined today.  Patient has dementia.  She is alert to only self.  Denies chest pain, shortness of breath, abdominal pain, dizziness.  Objective:   Filed Vitals:   07/16/14 1630 07/16/14 1711 07/16/14 2107 07/17/14 0434  BP: 141/98 174/88 137/77 128/85  Pulse: 64 69 60 62  Temp:  97.3 F (36.3 C) 97 F (36.1 C) 98.1 F (36.7 C)  TempSrc:  Oral Axillary Oral  Resp: 24 20 18 18   Height:  5\' 4"  (1.626 m)    Weight:  60.3 kg (132 lb 15 oz)  61.4 kg (135 lb 5.8 oz)  SpO2: 100% 91% 92% 92%    Wt Readings from Last 3 Encounters:  07/17/14 61.4 kg (135 lb 5.8 oz)  05/31/14 63.64 kg (140 lb 4.8 oz)  04/12/14 61.735 kg (136 lb 1.6 oz)     Intake/Output Summary (Last 24 hours) at 07/17/14 1026 Last data filed at 07/17/14 0900  Gross per 24 hour  Intake    120 ml  Output      0 ml  Net    120 ml    Exam  General: Well developed, well nourished, NAD, appears stated age  HEENT: NCAT, mucous membranes moist.   Cardiovascular: S1 S2 auscultated, no rubs, murmurs or gallops. Regular rate and rhythm.  Respiratory: Clear to auscultation bilaterally with equal chest rise  Abdomen: Soft, nontender, nondistended, + bowel sounds  Extremities: warm dry without cyanosis clubbing or edema  Neuro: Awake and alert, oriented to self.  Can follow directions.  No focal deficits.  Has dementia.   Skin: multiple bruises  Psych: pleasant  Data Review   Micro Results No results found for this or any previous visit (from the past 240 hour(s)).  Radiology Reports Dg Chest 1 View  07/16/2014   CLINICAL DATA:  Fall, congestive heart failure  EXAM: CHEST - 1 VIEW  COMPARISON:  05/30/2014, CT 8 3,015, PET-CT 03/11/2014  FINDINGS: Stable enlarged cardiac silhouette. There  are bilateral pleural effusions not changed. There is chronic interstitial lung disease. There is increase in linear opacities the left right lung. There is nodularity in the right lower lobe which is not changed. These correspond to nodule described on comparison CT of 02/28/2014 and PET-CT . Stable consolidation at the left lung apex.  IMPRESSION: 1. Interval increased interstitial edema. 2. Stable cardiomegaly and bilateral pleural effusions. 3. Stable nodularity in the right lower lobe and consolidation in the left apex   Electronically Signed   By: Suzy Bouchard M.D.   On: 07/16/2014 14:44   Dg Pelvis 1-2 Views  07/16/2014   CLINICAL DATA:  Fall; Pain appears to be in hips and  pelvis  EXAM: PELVIS - 1-2 VIEW  COMPARISON:  Radiograph 03/29/2014  FINDINGS: Left hip prosthetic noted. No evidence of fracture or dislocation of left right hip. No pelvic or sacral fracture.  IMPRESSION: No fracture or dislocation.  No pelvic fracture.   Electronically Signed   By: Suzy Bouchard M.D.   On: 07/16/2014 14:41   Ct Head Wo Contrast  07/16/2014   CLINICAL DATA:  Fall, history of breast cancer  EXAM: CT HEAD WITHOUT CONTRAST  CT CERVICAL SPINE WITHOUT CONTRAST  TECHNIQUE: Multidetector CT imaging of the head and cervical spine was performed following the standard protocol without intravenous contrast. Multiplanar CT image reconstructions of the cervical spine were also generated.  COMPARISON:  None.  FINDINGS: CT HEAD FINDINGS  No evidence of parenchymal hemorrhage or extra-axial fluid collection. No mass lesion, mass effect, or midline shift.  No CT evidence of acute infarction.  Subcortical white matter and periventricular small vessel ischemic changes. Mild intracranial atherosclerosis.  Global cortical atrophy with secondary ventricular prominence.  The visualized paranasal sinuses are essentially clear. The mastoid air cells are unopacified.  No evidence of calvarial fracture.  CT CERVICAL SPINE FINDINGS   Normal cervical lordosis.  No evidence of fracture or dislocation. Vertebral body heights are maintained. Dens appears intact.  No prevertebral soft tissue swelling.  Moderate degenerative changes of the mid cervical spine.  Visualized thyroid is unremarkable.  Stable radiation changes versus pleural parenchymal scarring in the left lung apex. Right apical pleural parenchymal scarring.  IMPRESSION: No evidence of acute intracranial abnormality. Atrophy with small vessel ischemic changes and intracranial atherosclerosis.  No evidence of traumatic injury to the cervical spine. Moderate multilevel degenerative changes.   Electronically Signed   By: Julian Hy M.D.   On: 07/16/2014 14:15   Ct Cervical Spine Wo Contrast  07/16/2014   CLINICAL DATA:  Fall, history of breast cancer  EXAM: CT HEAD WITHOUT CONTRAST  CT CERVICAL SPINE WITHOUT CONTRAST  TECHNIQUE: Multidetector CT imaging of the head and cervical spine was performed following the standard protocol without intravenous contrast. Multiplanar CT image reconstructions of the cervical spine were also generated.  COMPARISON:  None.  FINDINGS: CT HEAD FINDINGS  No evidence of parenchymal hemorrhage or extra-axial fluid collection. No mass lesion, mass effect, or midline shift.  No CT evidence of acute infarction.  Subcortical white matter and periventricular small vessel ischemic changes. Mild intracranial atherosclerosis.  Global cortical atrophy with secondary ventricular prominence.  The visualized paranasal sinuses are essentially clear. The mastoid air cells are unopacified.  No evidence of calvarial fracture.  CT CERVICAL SPINE FINDINGS  Normal cervical lordosis.  No evidence of fracture or dislocation. Vertebral body heights are maintained. Dens appears intact.  No prevertebral soft tissue swelling.  Moderate degenerative changes of the mid cervical spine.  Visualized thyroid is unremarkable.  Stable radiation changes versus pleural parenchymal  scarring in the left lung apex. Right apical pleural parenchymal scarring.  IMPRESSION: No evidence of acute intracranial abnormality. Atrophy with small vessel ischemic changes and intracranial atherosclerosis.  No evidence of traumatic injury to the cervical spine. Moderate multilevel degenerative changes.   Electronically Signed   By: Julian Hy M.D.   On: 07/16/2014 14:15    CBC  Recent Labs Lab 07/16/14 1219 07/17/14 0411  WBC 6.1 5.1  HGB 13.3 11.8*  HCT 42.2 37.7  PLT 196 226  MCV 91.3 93.3  MCH 28.8 29.2  MCHC 31.5 31.3  RDW 14.6 14.8  LYMPHSABS 1.4  --  MONOABS 0.4  --   EOSABS 0.1  --   BASOSABS 0.0  --     Chemistries   Recent Labs Lab 07/16/14 1219 07/17/14 0411  NA 144 140  K 3.1* 3.4*  CL 101 100  CO2 29 29  GLUCOSE 89 97  BUN 10 11  CREATININE 0.81 0.91  CALCIUM 9.5 8.9  MG  --  2.0  AST 21  --   ALT 9  --   ALKPHOS 114  --   BILITOT 0.8  --    ------------------------------------------------------------------------------------------------------------------ estimated creatinine clearance is 37.6 mL/min (by C-G formula based on Cr of 0.91). ------------------------------------------------------------------------------------------------------------------ No results for input(s): HGBA1C in the last 72 hours. ------------------------------------------------------------------------------------------------------------------ No results for input(s): CHOL, HDL, LDLCALC, TRIG, CHOLHDL, LDLDIRECT in the last 72 hours. ------------------------------------------------------------------------------------------------------------------  Recent Labs  07/17/14 0411  TSH 15.770*   ------------------------------------------------------------------------------------------------------------------ No results for input(s): VITAMINB12, FOLATE, FERRITIN, TIBC, IRON, RETICCTPCT in the last 72 hours.  Coagulation profile  Recent Labs Lab 07/16/14 1219  INR  1.34    No results for input(s): DDIMER in the last 72 hours.  Cardiac Enzymes No results for input(s): CKMB, TROPONINI, MYOGLOBIN in the last 168 hours.  Invalid input(s): CK ------------------------------------------------------------------------------------------------------------------ Invalid input(s): POCBNP    Aubriel Khanna D.O. on 07/17/2014 at 10:26 AM  Between 7am to 7pm - Pager - [phone]  After 7pm go to [email or web address] - password [email or web address]  And look for the night coverage person covering for me after hours  [facility] Office  [phone]

## 2014-07-17 NOTE — Progress Notes (Signed)
Physical Therapy Evaluation Patient Details Name: Leah Guerra MRN: [id number] DOB: 12/18/1924 Today's Date: 07/17/2014   History of Present Illness  Patient is an 80 yo female admitted 07/16/14 following syncopal episode with fall (patient found on kitchen floor).  PMH:  dementia, CHF, HTN, pleural effusions, breast CA.  Clinical Impression  Patient presents with problems listed below.  Will benefit from acute PT to address mobility issues.  Patient with significant dementia and high fall risk.  Does not have 24 hour assist at home.  Recommend SNF at discharge for continued therapy and 24 hour assist.    Follow Up Recommendations SNF;Supervision/Assistance - 24 hour    Equipment Recommendations  None recommended by PT    Recommendations for Other Services       Precautions / Restrictions Precautions Precautions: Fall Restrictions Weight Bearing Restrictions: No      Mobility  Bed Mobility Overal bed mobility: Needs Assistance Bed Mobility: Supine to Sit;Sit to Supine     Supine to sit: Mod assist;HOB elevated Sit to supine: Min assist;HOB elevated   General bed mobility comments: Verbal and tactile cues for technique - step by step instructions.  Repeated cues to initiate movement.  Mod assist to move trunk to sitting position.  Transfers Overall transfer level: Needs assistance Equipment used: Rolling walker (2 wheeled) Transfers: Sit to/from Stand Sit to Stand: Mod assist         General transfer comment: Practiced sit <> stand x5 times from bed and toilet.  Each time, required cues for placement of feet and hands.  Patient pushing backward into extension initially.  Facilitation to shift trunk forward over feet prior to standing.  Mod assist to rise to standing.    Ambulation/Gait Ambulation/Gait assistance: Min assist Ambulation Distance (Feet): 72 Feet (24' x3 with sitting rest breaks and to bathroom) Assistive device: Rolling walker (2  wheeled) Gait Pattern/deviations: Step-through pattern;Decreased step length - right;Decreased step length - left;Decreased stride length;Shuffle;Leaning posteriorly;Trunk flexed Gait velocity: Decreased Gait velocity interpretation: Below normal speed for age/gender General Gait Details: Verbal and tactile cues for use of RW.  Required assist to maneuver RW especially in turns, in bathroom, and to avoid obstacles in room.  Verbal cues to stand upright - patient with flexed posture.  Patient easily distracted by people and objects in room.  She stops, and then needs cuing to return to task.  Balance decreased with gait.  Stairs            Wheelchair Mobility    Modified Rankin (Stroke Patients Only)       Balance Overall balance assessment: Needs assistance         Standing balance support: Bilateral upper extremity supported Standing balance-[name]: Poor Standing balance comment: Patient leaning posteriorly, with flexed posture.                             Pertinent Vitals/Pain Pain Assessment: Faces Faces Pain Scale: Hurts a little bit Pain Location: Rt ankle Pain Intervention(s): Monitored during session    Home Living Family/patient expects to be discharged to:: Skilled nursing facility Living Arrangements: Alone               Additional Comments: Patient has daughter, son-in-law and hired Public librarian providing her care now.  Does not have 24 hour assist.      Prior Function Level of Independence: Needs assistance   Gait / Transfers Assistance Needed: Per daughter, patient was  able to ambulate without assist once she "got going".  Needed assist to stand.  ADL's / Homemaking Assistance Needed: Assist with ADL's.  Is able to feed self.        Hand Dominance        Extremity/Trunk Assessment   Upper Extremity Assessment: Generalized weakness           Lower Extremity Assessment: Generalized weakness      Cervical / Trunk  Assessment: Kyphotic  Communication   Communication: HOH  Cognition Arousal/Alertness: Awake/alert Behavior During Therapy: WFL for tasks assessed/performed Overall Cognitive Status: Impaired/Different from baseline Area of Impairment: Orientation;Attention;Memory;Following commands;Safety/judgement;Awareness;Problem solving Orientation Level: Disoriented to;Place;Time;Situation Current Attention Level: Focused Memory: Decreased short-term memory Following Commands: Follows one step commands inconsistently;Follows one step commands with increased time Safety/Judgement: Decreased awareness of deficits;Decreased awareness of safety   Problem Solving: Slow processing;Decreased initiation;Difficulty sequencing;Requires verbal cues;Requires tactile cues General Comments: Patient required repeated cues for mobility.  Unable to provide any information on PLOF or living situation.  Very easily distracted from task    General Comments      Exercises        Assessment/Plan    PT Assessment Patient needs continued PT services  PT Diagnosis Difficulty walking;Abnormality of gait;Generalized weakness;Acute pain;Altered mental status   PT Problem List Decreased strength;Decreased activity tolerance;Decreased balance;Decreased mobility;Decreased cognition;Decreased knowledge of use of DME;Decreased safety awareness;Decreased knowledge of precautions;Pain  PT Treatment Interventions DME instruction;Gait training;Functional mobility training;Therapeutic activities;Patient/family education   PT Goals (Current goals can be found in the Care Plan section) Acute Rehab PT Goals Patient Stated Goal: Unable to state.  Daughter wants safe d/c plan PT Goal Formulation: With patient/family Time For Goal Achievement: 07/31/14 Potential to Achieve Goals: Fair    Frequency Min 3X/week   Barriers to discharge Decreased caregiver support Family and aide available, but not for 24 hours.  Patient home alone  during day.    Co-evaluation               End of Session Equipment Utilized During Treatment: Gait belt Activity Tolerance: Patient limited by fatigue Patient left: in bed;with call bell/phone within reach;with bed alarm set;with family/visitor present      Functional Assessment Tool Used: Clinical judgement Functional Limitation: Mobility: Walking and moving around Mobility: Walking and Moving Around Current Status ([id number]): At least 40 percent but less than 60 percent impaired, limited or restricted Mobility: Walking and Moving Around Goal Status [phone]): At least 1 percent but less than 20 percent impaired, limited or restricted    Time: 1203-1302 PT Time Calculation (min) (ACUTE ONLY): 59 min   Charges:   PT Evaluation $Initial PT Evaluation Tier I: 1 Procedure PT Treatments $Gait Training: 23-37 mins $Therapeutic Activity: 8-22 mins   PT G Codes:   Functional Assessment Tool Used: Clinical judgement Functional Limitation: Mobility: Walking and moving around    Despina Pole 07/17/2014, 4:45 PM Carita Pian. Sanjuana Kava, [place] Pager [phone]

## 2014-07-17 NOTE — Progress Notes (Signed)
Bilateral carotid artery duplex completed:  1-39% ICA stenosis.  Vertebral artery flow is antegrade.     

## 2014-07-17 NOTE — Progress Notes (Signed)
Utilization Review Completed.   Liyah Higham, RN, BSN Nurse Case Manager  

## 2014-07-18 ENCOUNTER — Telehealth: Payer: Self-pay | Admitting: Oncology

## 2014-07-18 DIAGNOSIS — F0390 Unspecified dementia without behavioral disturbance: Secondary | ICD-10-CM

## 2014-07-18 DIAGNOSIS — C50919 Malignant neoplasm of unspecified site of unspecified female breast: Secondary | ICD-10-CM | POA: Insufficient documentation

## 2014-07-18 DIAGNOSIS — Z9181 History of falling: Secondary | ICD-10-CM

## 2014-07-18 DIAGNOSIS — J90 Pleural effusion, not elsewhere classified: Secondary | ICD-10-CM

## 2014-07-18 DIAGNOSIS — I4891 Unspecified atrial fibrillation: Secondary | ICD-10-CM

## 2014-07-18 LAB — CBC
HEMATOCRIT: 39.8 % (ref 36.0–46.0)
HEMOGLOBIN: 12.4 g/dL (ref 12.0–15.0)
MCH: 28.6 pg (ref 26.0–34.0)
MCHC: 31.2 g/dL (ref 30.0–36.0)
MCV: 91.9 fL (ref 78.0–100.0)
Platelets: 234 10*3/uL (ref 150–400)
RBC: 4.33 MIL/uL (ref 3.87–5.11)
RDW: 14.7 % (ref 11.5–15.5)
WBC: 5.6 10*3/uL (ref 4.0–10.5)

## 2014-07-18 LAB — BASIC METABOLIC PANEL
Anion gap: 11 (ref 5–15)
BUN: 9 mg/dL (ref 6–23)
CO2: 27 meq/L (ref 19–32)
Calcium: 9 mg/dL (ref 8.4–10.5)
Chloride: 103 mEq/L (ref 96–112)
Creatinine, Ser: 0.81 mg/dL (ref 0.50–1.10)
GFR calc Af Amer: 74 mL/min — ABNORMAL LOW (ref >90)
GFR, EST NON AFRICAN AMERICAN: 63 mL/min — AB (ref >90)
GLUCOSE: 101 mg/dL — AB (ref 70–99)
POTASSIUM: 4 meq/L (ref 3.7–5.3)
Sodium: 141 mEq/L (ref 137–147)

## 2014-07-18 NOTE — Progress Notes (Signed)
UR completed 

## 2014-07-18 NOTE — Evaluation (Signed)
Occupational Therapy Evaluation Patient Details Name: Leah Guerra MRN: [id number] DOB: 1925-05-21 Today's Date: 07/18/2014    History of Present Illness Patient is an 81 yo female admitted 07/16/14 following syncopal episode with fall (patient found on kitchen floor).  PMH:  dementia, CHF, HTN, pleural effusions, breast CA.   Clinical Impression   Pt. Is pleasant but confused during treatment.Pt. Required A with ADLs and mobility secondary to weakness and confusion.  Pt. Requires extensive cues and extra time to follow 1 step directions. Pt. Will need 24 hour care at home. Pt. Family is an agreement with SNF placement. Pt. Will need further skilled OT to maximize pt. Performance and decreased caregiver burden.     Follow Up Recommendations  SNF    Equipment Recommendations  None recommended by OT    Recommendations for Other Services       Precautions / Restrictions Precautions Precautions: Fall Restrictions Weight Bearing Restrictions: No      Mobility Bed Mobility Overal bed mobility: Needs Assistance Bed Mobility: Supine to Sit     Supine to sit: Mod assist     General bed mobility comments:  (verbal cues)  Transfers Overall transfer level: Needs assistance     Sit to Stand: Mod assist              Balance                                            ADL Overall ADL's : Needs assistance/impaired Eating/Feeding: Set up   Grooming: Wash/dry hands;Wash/dry face;Brushing hair;Minimal assistance;Moderate assistance   Upper Body Bathing: Supervision/ safety;Minimal assitance   Lower Body Bathing: Maximal assistance   Upper Body Dressing : Moderate assistance   Lower Body Dressing: Total assistance   Toilet Transfer: Moderate assistance   Toileting- Clothing Manipulation and Hygiene: Total assistance       Functional mobility during ADLs: Moderate assistance General ADL Comments: Pt. requires extensive cues      Vision                     Perception     Praxis      Pertinent Vitals/Pain Pain Assessment: No/denies pain     Hand Dominance     Extremity/Trunk Assessment Upper Extremity Assessment Upper Extremity Assessment: Generalized weakness           Communication Communication Communication: HOH   Cognition Arousal/Alertness: Awake/alert Behavior During Therapy: WFL for tasks assessed/performed Overall Cognitive Status: Impaired/Different from baseline Area of Impairment: Orientation;Attention;Memory;Following commands;Safety/judgement;Awareness;Problem solving Orientation Level: Disoriented to;Place;Time;Situation Current Attention Level: Focused Memory: Decreased short-term memory Following Commands: Follows one step commands inconsistently;Follows one step commands with increased time Safety/Judgement: Decreased awareness of deficits;Decreased awareness of safety   Problem Solving: Slow processing;Decreased initiation;Difficulty sequencing;Requires verbal cues;Requires tactile cues General Comments: Patient required repeated cues for mobility.  Unable to provide any information on PLOF or living situation.  Very easily distracted from task   General Comments       Exercises       Shoulder Instructions      Home Living Family/patient expects to be discharged to:: Skilled nursing facility Living Arrangements: Alone                               Additional Comments: Patient has daughter, son-in-law and  hired Public librarian providing her care now.  Does not have 24 hour assist.        Prior Functioning/Environment Level of Independence: Needs assistance  Gait / Transfers Assistance Needed: Per daughter, patient was able to ambulate without assist once she "got going".  Needed assist to stand. ADL's / Homemaking Assistance Needed: Assist with ADL's.  Is able to feed self.        OT Diagnosis: Generalized weakness   OT Problem List:  Decreased strength;Decreased activity tolerance;Decreased cognition;Decreased safety awareness;Decreased knowledge of use of DME or AE   OT Treatment/Interventions: Self-care/ADL training;Therapeutic exercise;Neuromuscular education;DME and/or AE instruction    OT Goals(Current goals can be found in the care plan section) Acute Rehab OT Goals Patient Stated Goal:  (unable to state) OT Goal Formulation: Patient unable to participate in goal setting Time For Goal Achievement: 08/01/14 ADL Goals Pt Will Perform Grooming: with supervision;standing Pt Will Perform Upper Body Bathing: with supervision;sitting Pt Will Perform Lower Body Bathing: with min assist;sit to/from stand Pt Will Perform Upper Body Dressing: with supervision;sitting Pt Will Perform Lower Body Dressing: with min assist;sit to/from stand Pt Will Transfer to Toilet: with supervision;bedside commode  OT Frequency: Min 2X/week   Barriers to D/C: Decreased caregiver support          Co-evaluation              End of Session Equipment Utilized During Treatment: Gait belt  Activity Tolerance: Patient tolerated treatment well Patient left: in chair;with call bell/phone within reach   Time: 0650-0745 OT Time Calculation (min): 55 min Charges:  OT General Charges $OT Visit: 1 Procedure OT Evaluation $Initial OT Evaluation Tier I: 1 Procedure OT Treatments $Self Care/Home Management : 38-52 mins G-Codes: OT G-codes **NOT FOR INPATIENT CLASS** Functional Limitation: Self care Self Care Current Status (A4536): At least 80 percent but less than 100 percent impaired, limited or restricted Self Care Goal Status (I6803): At least 40 percent but less than 60 percent impaired, limited or restricted  Leah Guerra 07/18/2014, 7:44 AM

## 2014-07-18 NOTE — Telephone Encounter (Signed)
Pt's daughter called states pt is in the hospital and she is not sure if she will get out on time before pt's next apt with MD, I sent her to MD's desk nurse to leave a msg because she would like for MD to see pt in the hospital if she isn't released. Pt is at [facility] forwarded call to nurse.... KJ

## 2014-07-18 NOTE — Progress Notes (Signed)
Physical Therapy Treatment Patient Details Name: Leah Guerra MRN: [id number] DOB: 04-Mar-1925 Today's Date: 07/18/2014    History of Present Illness Patient is an 80 yo female admitted 07/16/14 following syncopal episode with fall (patient found on kitchen floor).  PMH:  dementia, CHF, HTN, pleural effusions, breast CA.    PT Comments    Patient tolerated treatment well despite fatigue, continues to required assist and max cues secondary to cogntiion deficits. Will continue to see and progress as tolerated.  Follow Up Recommendations  SNF;Supervision/Assistance - 24 hour     Equipment Recommendations  None recommended by PT    Recommendations for Other Services       Precautions / Restrictions Precautions Precautions: Fall Restrictions Weight Bearing Restrictions: No    Mobility  Bed Mobility Overal bed mobility: Needs Assistance Bed Mobility: Supine to Sit     Supine to sit: Mod assist Sit to supine: Mod assist   General bed mobility comments: cs for positioning and sequencing, assist for elevation to upright and trunk rotation, assist for LE elevation back to bed  Transfers Overall transfer level: Needs assistance Equipment used: Rolling walker (2 wheeled);1 person hand held assist Transfers: Sit to/from Stand Sit to Stand: Mod assist;Max assist         General transfer comment: Max assist to power down to toilet (increased anxiety, difficulty initiation despite cues  Ambulation/Gait Ambulation/Gait assistance: Min assist Ambulation Distance (Feet): 60 Feet (15 ft with HHA) Assistive device: Rolling walker (2 wheeled) Gait Pattern/deviations: Step-through pattern;Decreased step length - right;Decreased step length - left;Decreased stride length;Shuffle;Leaning posteriorly;Trunk flexed Gait velocity: Decreased Gait velocity interpretation: Below normal speed for age/gender General Gait Details: Max cues and manual assist for positioning and safety  with RW, poor carryover, improved stability with use of HHA.   Stairs            Wheelchair Mobility    Modified Rankin (Stroke Patients Only)       Balance             Standing balance-[name]: Poor                      Cognition Arousal/Alertness: Awake/alert Behavior During Therapy: WFL for tasks assessed/performed Overall Cognitive Status: Impaired/Different from baseline Area of Impairment: Orientation;Attention;Memory;Following commands;Safety/judgement;Awareness;Problem solving Orientation Level: Disoriented to;Place;Time;Situation Current Attention Level: Focused Memory: Decreased short-term memory Following Commands: Follows one step commands inconsistently;Follows one step commands with increased time Safety/Judgement: Decreased awareness of deficits;Decreased awareness of safety   Problem Solving: Slow processing;Decreased initiation;Difficulty sequencing;Requires verbal cues;Requires tactile cues      Exercises      General Comments        Pertinent Vitals/Pain Pain Assessment: No/denies pain    Home Living                      Prior Function            PT Goals (current goals can now be found in the care plan section) Acute Rehab PT Goals Patient Stated Goal:  (unable to state) PT Goal Formulation: With patient/family Time For Goal Achievement: 07/31/14 Potential to Achieve Goals: Fair Progress towards PT goals: Progressing toward goals    Frequency  Min 3X/week    PT Plan Current plan remains appropriate    Co-evaluation             End of Session Equipment Utilized During Treatment: Gait belt Activity Tolerance: Patient limited by fatigue  Patient left: in bed;with call bell/phone within reach;with bed alarm set;with family/visitor present     Time: 0802-2336 PT Time Calculation (min) (ACUTE ONLY): 19 min  Charges:  $Gait Training: 8-22 mins                    G CodesDuncan Dull July 20, 2014, 5:12 PM Alben Deeds, [place] DPT  [phone]

## 2014-07-18 NOTE — Progress Notes (Signed)
IP PROGRESS NOTE  Subjective:   Leah Guerra is well-known to me with a history of metastatic breast cancer, currently treated with Femara. She was admitted after being found on her kitchen floor. She appears comfortable this morning. She is not oriented. She complains of dyspnea.  Objective: Vital signs in last 24 hours: Blood pressure 159/99, pulse 79, temperature 97.6 F (36.4 C), temperature source Oral, resp. rate 17, height 5' 4"  (1.626 m), weight 131 lb 6.3 oz (59.6 kg), SpO2 93 %.  Intake/Output from previous day: 12/20 0701 - 12/21 0700 In: 480 [P.O.:480] Out: -   Physical Exam:  HEENT: No thrush, neck without mass Lungs: Scattered inspiratory rhonchi Cardiac: Irregular Abdomen: No hepatomegaly, nontender Extremities: No leg edema Breasts: Status post left mastectomy. No evidence for chest wall tumor recurrence. Lymph nodes: No cervical or supraclavicular nodes. Pea-sized left axillary node.    Lab Results:  Recent Labs  07/17/14 0411 07/18/14 0405  WBC 5.1 5.6  HGB 11.8* 12.4  HCT 37.7 39.8  PLT 226 234    BMET  Recent Labs  07/17/14 0411 07/18/14 0405  NA 140 141  K 3.4* 4.0  CL 100 103  CO2 29 27  GLUCOSE 97 101*  BUN 11 9  CREATININE 0.91 0.81  CALCIUM 8.9 9.0    Studies/Results: Dg Chest 1 View  07/16/2014   CLINICAL DATA:  Fall, congestive heart failure  EXAM: CHEST - 1 VIEW  COMPARISON:  05/30/2014, CT 8 3,015, PET-CT 03/11/2014  FINDINGS: Stable enlarged cardiac silhouette. There are bilateral pleural effusions not changed. There is chronic interstitial lung disease. There is increase in linear opacities the left right lung. There is nodularity in the right lower lobe which is not changed. These correspond to nodule described on comparison CT of 02/28/2014 and PET-CT . Stable consolidation at the left lung apex.  IMPRESSION: 1. Interval increased interstitial edema. 2. Stable cardiomegaly and bilateral pleural effusions. 3. Stable  nodularity in the right lower lobe and consolidation in the left apex   Electronically Signed   By: Suzy Bouchard M.D.   On: 07/16/2014 14:44   Dg Pelvis 1-2 Views  07/16/2014   CLINICAL DATA:  Fall; Pain appears to be in hips and pelvis  EXAM: PELVIS - 1-2 VIEW  COMPARISON:  Radiograph 03/29/2014  FINDINGS: Left hip prosthetic noted. No evidence of fracture or dislocation of left right hip. No pelvic or sacral fracture.  IMPRESSION: No fracture or dislocation.  No pelvic fracture.   Electronically Signed   By: Suzy Bouchard M.D.   On: 07/16/2014 14:41   Ct Head Wo Contrast  07/16/2014   CLINICAL DATA:  Fall, history of breast cancer  EXAM: CT HEAD WITHOUT CONTRAST  CT CERVICAL SPINE WITHOUT CONTRAST  TECHNIQUE: Multidetector CT imaging of the head and cervical spine was performed following the standard protocol without intravenous contrast. Multiplanar CT image reconstructions of the cervical spine were also generated.  COMPARISON:  None.  FINDINGS: CT HEAD FINDINGS  No evidence of parenchymal hemorrhage or extra-axial fluid collection. No mass lesion, mass effect, or midline shift.  No CT evidence of acute infarction.  Subcortical white matter and periventricular small vessel ischemic changes. Mild intracranial atherosclerosis.  Global cortical atrophy with secondary ventricular prominence.  The visualized paranasal sinuses are essentially clear. The mastoid air cells are unopacified.  No evidence of calvarial fracture.  CT CERVICAL SPINE FINDINGS  Normal cervical lordosis.  No evidence of fracture or dislocation. Vertebral body heights are maintained. Dens  appears intact.  No prevertebral soft tissue swelling.  Moderate degenerative changes of the mid cervical spine.  Visualized thyroid is unremarkable.  Stable radiation changes versus pleural parenchymal scarring in the left lung apex. Right apical pleural parenchymal scarring.  IMPRESSION: No evidence of acute intracranial abnormality. Atrophy  with small vessel ischemic changes and intracranial atherosclerosis.  No evidence of traumatic injury to the cervical spine. Moderate multilevel degenerative changes.   Electronically Signed   By: Julian Hy M.D.   On: 07/16/2014 14:15   Ct Cervical Spine Wo Contrast  07/16/2014   CLINICAL DATA:  Fall, history of breast cancer  EXAM: CT HEAD WITHOUT CONTRAST  CT CERVICAL SPINE WITHOUT CONTRAST  TECHNIQUE: Multidetector CT imaging of the head and cervical spine was performed following the standard protocol without intravenous contrast. Multiplanar CT image reconstructions of the cervical spine were also generated.  COMPARISON:  None.  FINDINGS: CT HEAD FINDINGS  No evidence of parenchymal hemorrhage or extra-axial fluid collection. No mass lesion, mass effect, or midline shift.  No CT evidence of acute infarction.  Subcortical white matter and periventricular small vessel ischemic changes. Mild intracranial atherosclerosis.  Global cortical atrophy with secondary ventricular prominence.  The visualized paranasal sinuses are essentially clear. The mastoid air cells are unopacified.  No evidence of calvarial fracture.  CT CERVICAL SPINE FINDINGS  Normal cervical lordosis.  No evidence of fracture or dislocation. Vertebral body heights are maintained. Dens appears intact.  No prevertebral soft tissue swelling.  Moderate degenerative changes of the mid cervical spine.  Visualized thyroid is unremarkable.  Stable radiation changes versus pleural parenchymal scarring in the left lung apex. Right apical pleural parenchymal scarring.  IMPRESSION: No evidence of acute intracranial abnormality. Atrophy with small vessel ischemic changes and intracranial atherosclerosis.  No evidence of traumatic injury to the cervical spine. Moderate multilevel degenerative changes.   Electronically Signed   By: Julian Hy M.D.   On: 07/16/2014 14:15    Medications: I have reviewed the patient's current  medications.  Assessment/Plan:  1. Metastatic breast cancer, initial diagnosis of breast cancer dates to February of 2000  CT-guided biopsy of a right subpleural nodule 03/24/2014 confirmed metastatic breast cancer, ER positive, PR positive, and HER-2 negative  Femara initiated 04/12/2014  2. Chronic pleural effusion, status post negative cytology evaluation since June 2014 and July 2014  3. Atrial fibrillation  4. Dementia   Ms. Hudler is admitted after being found in her kitchen floor. There is no apparent acute injury. There is no clinical evidence for progression of the metastatic breast cancer.  Her daughter is not present this morning. She telephoned our office last week requesting a "hospice "evaluation. Ms. Korf appears to have severe dementia and hospice may be appropriate based on this diagnosis.  Recommendations: 1. Continue Femara 2. Skilled nursing facility placement, she may be a hospice candidate based on the severe dementia diagnosis.  Please call oncology as needed. I am available to discuss the situation with her daughter as needed. I will check on her over the next few days.   LOS: 2 days   Betsy Coder  07/18/2014, 12:38 PM

## 2014-07-18 NOTE — Progress Notes (Signed)
[facility]                                                                              Patient Demographics  Leah Guerra, is a 80 y.o. female, DOB - May 18, 1925, [place] date - 07/16/2014   Admitting Physician Cristal Ford, DO  Outpatient Primary MD for the patient is  Melinda Crutch, MD  LOS - 2   Chief Complaint  Patient presents with  . Fall  . Head Injury      HPI on 07/16/2014 Leah Guerra is a 80 y.o. female with a history of dementia, congestive heart failure, hypertension, pleural effusions, breast cancer, lung nodule that presented to the emergency department after being found on her kitchen floor. Patient unaware of what occurred. Patient's daughter is at bedside and states that she last spoke to her mother yesterday morning. Patient was then found on her kitchen floor. It is unknown whether patient passed out or stumbled. At the time of admission, patient had no complaints. Imaging conducted in the emergency department showed stable findings in her chest, negative pelvic x-ray, negative CT head and cervical CT. [facility] was called for admission.  Assessment & Plan   Possible Syncope/fall -Patient will be admitted for observation -Will admit to telemetry and monitor for any arrhythmias (patient does have prolonged QT) -Echocardiogram September 2015 showed an improvement in EF, 55-60%, severe biatrial enlargement, [place] reveal pericardial effusions and elevated LV filling pressures -CT of the head: No evidence of acute intracranial abnormality -Carotid doppler: 1-39% ICA stenosis, vertebral artery flow is antegrade -PT and OT consulted and recommended SNF -Consulted social worker for possible placement -UA and CXR negative for infection  Prolonged QT -Repeat EKG today shows prolonged QT, however slightly less than 12/19 -Will hold Celexa  Dementia -Stable and according to daughter who is at bedside  Chronic diastolic heart  failure -Echocardiogram as listed above -Continue to monitor daily weights, intake and output -Patient currently appears to be euvolemic and compensated -Continue Lasix and Coreg -BNP 2168, lower than previous.  Patient appears to have a baseline BNP in the 2000 range  Chronic Pleural Effusions -Stable  Atrial fibrillation with subtherapeutic INR -Due to patient's fall risk, Coumadin will be held -Patient does have some lacerations, therefore Coumadin will be held at this time -Discussed this with the patient's daughter and she agrees although she understands that having history of cancer as well as atrial fib does place patient increased risk of developing blood clots  Breast cancer/lung nodule -Patient follows up with Dr. [name], will notify him of patient's admission -Continue Femara  Hyperlipidemia -Continue statin  Hypothyroidism -TSH 15.77 -Continue Synthroid, however will increase the dose slightly to 112mcg -Free T4 pending  Hypokalemia -Likely secondary to diuretics, will continue to replace as needed -Magnesium 2  Depression -Will hold Celexa (due to prolonged QT)  Decreased urine output -Continue to monitor  Code Status: DNR  Family Communication: Daughter at bedside.  Disposition Plan: Admitted, Possible discharge to [facility] 07/19/2014.  Time Spent in minutes   30 minutes  Procedures  None  Consults   None  DVT Prophylaxis  SCDs  Lab Results  Component Value Date  PLT 234 07/18/2014    Medications  Scheduled Meds: . atorvastatin  40 mg Oral Daily  . carvedilol  3.125 mg Oral BID WC  . furosemide  40 mg Oral Daily  . letrozole  2.5 mg Oral Daily  . levothyroxine  125 mcg Oral QAC breakfast  . sodium chloride  3 mL Intravenous Q12H  . sodium chloride  3 mL Intravenous Q12H  . zolpidem  2.5 mg Oral QHS   Continuous Infusions:  PRN Meds:.sodium chloride, acetaminophen **OR** acetaminophen, morphine injection, sodium chloride  Antibiotics     Anti-infectives    None      Subjective:   [name] seen and examined today.  Patient has dementia.  She is alert to only self.  Denies chest pain, shortness of breath, abdominal pain, dizziness.  Objective:   Filed Vitals:   07/17/14 1054 07/17/14 1430 07/17/14 2028 07/18/14 0601  BP: 155/98 137/91 153/87 159/99  Pulse: 56 81 61 79  Temp:  97.4 F (36.3 C) 98.7 F (37.1 C) 97.6 F (36.4 C)  TempSrc:  Oral Oral Oral  Resp:  18 18 17   Height:      Weight:    59.6 kg (131 lb 6.3 oz)  SpO2:  98% 94% 93%    Wt Readings from Last 3 Encounters:  07/18/14 59.6 kg (131 lb 6.3 oz)  05/31/14 63.64 kg (140 lb 4.8 oz)  04/12/14 61.735 kg (136 lb 1.6 oz)     Intake/Output Summary (Last 24 hours) at 07/18/14 1244 Last data filed at 07/18/14 1020  Gross per 24 hour  Intake    420 ml  Output      0 ml  Net    420 ml    Exam  General: Well developed, well nourished, NAD, appears stated age  HEENT: NCAT, mucous membranes moist.   Cardiovascular: S1 S2 auscultated, Irregular  Respiratory: Clear to auscultation bilaterally with equal chest rise  Abdomen: Soft, nontender, nondistended, + bowel sounds  Extremities: warm dry without cyanosis clubbing or edema  Neuro: Awake and alert, oriented to self. Dementia.  No focal deficits  Skin: multiple bruises  Psych: pleasant  Data Review   Micro Results No results found for this or any previous visit (from the past 240 hour(s)).  Radiology Reports Dg Chest 1 View  07/16/2014   CLINICAL DATA:  Fall, congestive heart failure  EXAM: CHEST - 1 VIEW  COMPARISON:  05/30/2014, CT 8 3,015, PET-CT 03/11/2014  FINDINGS: Stable enlarged cardiac silhouette. There are bilateral pleural effusions not changed. There is chronic interstitial lung disease. There is increase in linear opacities the left right lung. There is nodularity in the right lower lobe which is not changed. These correspond to nodule described on  comparison CT of 02/28/2014 and PET-CT . Stable consolidation at the left lung apex.  IMPRESSION: 1. Interval increased interstitial edema. 2. Stable cardiomegaly and bilateral pleural effusions. 3. Stable nodularity in the right lower lobe and consolidation in the left apex   Electronically Signed   By: Suzy Bouchard M.D.   On: 07/16/2014 14:44   Dg Pelvis 1-2 Views  07/16/2014   CLINICAL DATA:  Fall; Pain appears to be in hips and pelvis  EXAM: PELVIS - 1-2 VIEW  COMPARISON:  Radiograph 03/29/2014  FINDINGS: Left hip prosthetic noted. No evidence of fracture or dislocation of left right hip. No pelvic or sacral fracture.  IMPRESSION: No fracture or dislocation.  No pelvic fracture.   Electronically Signed   By:  Suzy Bouchard M.D.   On: 07/16/2014 14:41   Ct Head Wo Contrast  07/16/2014   CLINICAL DATA:  Fall, history of breast cancer  EXAM: CT HEAD WITHOUT CONTRAST  CT CERVICAL SPINE WITHOUT CONTRAST  TECHNIQUE: Multidetector CT imaging of the head and cervical spine was performed following the standard protocol without intravenous contrast. Multiplanar CT image reconstructions of the cervical spine were also generated.  COMPARISON:  None.  FINDINGS: CT HEAD FINDINGS  No evidence of parenchymal hemorrhage or extra-axial fluid collection. No mass lesion, mass effect, or midline shift.  No CT evidence of acute infarction.  Subcortical white matter and periventricular small vessel ischemic changes. Mild intracranial atherosclerosis.  Global cortical atrophy with secondary ventricular prominence.  The visualized paranasal sinuses are essentially clear. The mastoid air cells are unopacified.  No evidence of calvarial fracture.  CT CERVICAL SPINE FINDINGS  Normal cervical lordosis.  No evidence of fracture or dislocation. Vertebral body heights are maintained. Dens appears intact.  No prevertebral soft tissue swelling.  Moderate degenerative changes of the mid cervical spine.  Visualized thyroid is  unremarkable.  Stable radiation changes versus pleural parenchymal scarring in the left lung apex. Right apical pleural parenchymal scarring.  IMPRESSION: No evidence of acute intracranial abnormality. Atrophy with small vessel ischemic changes and intracranial atherosclerosis.  No evidence of traumatic injury to the cervical spine. Moderate multilevel degenerative changes.   Electronically Signed   By: Julian Hy M.D.   On: 07/16/2014 14:15   Ct Cervical Spine Wo Contrast  07/16/2014   CLINICAL DATA:  Fall, history of breast cancer  EXAM: CT HEAD WITHOUT CONTRAST  CT CERVICAL SPINE WITHOUT CONTRAST  TECHNIQUE: Multidetector CT imaging of the head and cervical spine was performed following the standard protocol without intravenous contrast. Multiplanar CT image reconstructions of the cervical spine were also generated.  COMPARISON:  None.  FINDINGS: CT HEAD FINDINGS  No evidence of parenchymal hemorrhage or extra-axial fluid collection. No mass lesion, mass effect, or midline shift.  No CT evidence of acute infarction.  Subcortical white matter and periventricular small vessel ischemic changes. Mild intracranial atherosclerosis.  Global cortical atrophy with secondary ventricular prominence.  The visualized paranasal sinuses are essentially clear. The mastoid air cells are unopacified.  No evidence of calvarial fracture.  CT CERVICAL SPINE FINDINGS  Normal cervical lordosis.  No evidence of fracture or dislocation. Vertebral body heights are maintained. Dens appears intact.  No prevertebral soft tissue swelling.  Moderate degenerative changes of the mid cervical spine.  Visualized thyroid is unremarkable.  Stable radiation changes versus pleural parenchymal scarring in the left lung apex. Right apical pleural parenchymal scarring.  IMPRESSION: No evidence of acute intracranial abnormality. Atrophy with small vessel ischemic changes and intracranial atherosclerosis.  No evidence of traumatic injury to the  cervical spine. Moderate multilevel degenerative changes.   Electronically Signed   By: Julian Hy M.D.   On: 07/16/2014 14:15    CBC  Recent Labs Lab 07/16/14 1219 07/17/14 0411 07/18/14 0405  WBC 6.1 5.1 5.6  HGB 13.3 11.8* 12.4  HCT 42.2 37.7 39.8  PLT 196 226 234  MCV 91.3 93.3 91.9  MCH 28.8 29.2 28.6  MCHC 31.5 31.3 31.2  RDW 14.6 14.8 14.7  LYMPHSABS 1.4  --   --   MONOABS 0.4  --   --   EOSABS 0.1  --   --   BASOSABS 0.0  --   --     Chemistries   Recent Labs  Lab 07/16/14 1219 07/17/14 0411 07/18/14 0405  NA 144 140 141  K 3.1* 3.4* 4.0  CL 101 100 103  CO2 29 29 27   GLUCOSE 89 97 101*  BUN 10 11 9   CREATININE 0.81 0.91 0.81  CALCIUM 9.5 8.9 9.0  MG  --  2.0  --   AST 21  --   --   ALT 9  --   --   ALKPHOS 114  --   --   BILITOT 0.8  --   --    ------------------------------------------------------------------------------------------------------------------ estimated creatinine clearance is 42.3 mL/min (by C-G formula based on Cr of 0.81). ------------------------------------------------------------------------------------------------------------------ No results for input(s): HGBA1C in the last 72 hours. ------------------------------------------------------------------------------------------------------------------ No results for input(s): CHOL, HDL, LDLCALC, TRIG, CHOLHDL, LDLDIRECT in the last 72 hours. ------------------------------------------------------------------------------------------------------------------  Recent Labs  07/17/14 0411  TSH 15.770*   ------------------------------------------------------------------------------------------------------------------ No results for input(s): VITAMINB12, FOLATE, FERRITIN, TIBC, IRON, RETICCTPCT in the last 72 hours.  Coagulation profile  Recent Labs Lab 07/16/14 1219  INR 1.34    No results for input(s): DDIMER in the last 72 hours.  Cardiac Enzymes No results for  input(s): CKMB, TROPONINI, MYOGLOBIN in the last 168 hours.  Invalid input(s): CK ------------------------------------------------------------------------------------------------------------------ Invalid input(s): POCBNP    Meral Geissinger D.O. on 07/18/2014 at 12:44 PM  Between 7am to 7pm - Pager - [phone]  After 7pm go to [email or web address] - password [email or web address]  And look for the night coverage person covering for me after hours  [facility] Office  [phone]

## 2014-07-18 NOTE — Telephone Encounter (Signed)
Per 12/21 POF, r/s pt MD visit, s/w daughter to confirm apt in Jan and mailed sch to pt.... KJ

## 2014-07-18 NOTE — Care Management Note (Signed)
    Page 1 of 1   07/19/2014     3:28:13 PM CARE MANAGEMENT NOTE 07/19/2014  Patient:  Leah Guerra, Leah Guerra   Account Number:  [id number]  Date Initiated:  07/18/2014  Documentation initiated by:  Lyn Joens  Subjective/Objective Assessment:   Pt adm on 12/19 with syncope.  PTA, pt lives alone.     Action/Plan:   PT/OT recommending SNF at dc.  CSW following to facilitate dc to SNF when medically stable for dc.  Will follow progress.   Anticipated DC Date:  07/19/2014   Anticipated DC Plan:  SKILLED NURSING FACILITY  In-house referral  Clinical Social Worker      DC Planning Services  CM consult      Choice offered to / List presented to:             Status of service:  Completed, signed off Medicare Important Message given?  NA - LOS <3 / Initial given by admissions (If response is "NO", the following Medicare IM given date fields will be blank) Date Medicare IM given:   Medicare IM given by:   Date Additional Medicare IM given:   Additional Medicare IM given by:    Discharge Disposition:  [place]  Per UR Regulation:  Reviewed for med. necessity/level of care/duration of stay  If discussed at [place] of Stay Meetings, dates discussed:    Comments:  07/19/14 Ellan Lambert, RN, BSN [phone] Pt discharging to SNF today, per CSW arrangements.

## 2014-07-18 NOTE — Telephone Encounter (Signed)
Per Dr. [name]; notified Gerre Scull (pt's daughter) that appt for 07/19/14 will be cancelled d/t pt in hospital and schedulers will be in contact with next appt date/time.  Any questions, please call office.

## 2014-07-19 ENCOUNTER — Ambulatory Visit: Payer: Medicare Other | Admitting: Oncology

## 2014-07-19 DIAGNOSIS — F0390 Unspecified dementia without behavioral disturbance: Secondary | ICD-10-CM | POA: Diagnosis not present

## 2014-07-19 DIAGNOSIS — I5032 Chronic diastolic (congestive) heart failure: Secondary | ICD-10-CM | POA: Diagnosis not present

## 2014-07-19 DIAGNOSIS — R55 Syncope and collapse: Secondary | ICD-10-CM | POA: Diagnosis not present

## 2014-07-19 DIAGNOSIS — I4581 Long QT syndrome: Secondary | ICD-10-CM | POA: Diagnosis not present

## 2014-07-19 LAB — GLUCOSE, CAPILLARY: GLUCOSE-CAPILLARY: 97 mg/dL (ref 70–99)

## 2014-07-19 LAB — T4, FREE: Free T4: 1.34 ng/dL (ref 0.80–1.80)

## 2014-07-19 MED ORDER — ZOLPIDEM TARTRATE 5 MG PO TABS: 2.5000 mg | ORAL_TABLET | Freq: Every day | ORAL | Status: DC

## 2014-07-19 MED ORDER — LEVOTHYROXINE SODIUM 125 MCG PO TABS: 125.0000 ug | ORAL_TABLET | Freq: Every day | ORAL | Status: AC

## 2014-07-19 MED ORDER — CARVEDILOL 3.125 MG PO TABS
3.1250 mg | ORAL_TABLET | Freq: Two times a day (BID) | ORAL | Status: DC
  Administered 2014-07-19: 3.125 mg via ORAL
  Filled 2014-07-19 (×3): qty 1

## 2014-07-19 MED ORDER — CARVEDILOL 3.125 MG PO TABS: 3.1250 mg | ORAL_TABLET | Freq: Two times a day (BID) | ORAL | Status: AC

## 2014-07-19 MED ORDER — CITALOPRAM HYDROBROMIDE 20 MG PO TABS: 10.0000 mg | ORAL_TABLET | Freq: Every day | ORAL | Status: DC

## 2014-07-19 NOTE — Discharge Summary (Signed)
Physician Discharge Summary  [facility] EEF:[id number] DOB: 1924/07/30 DOA: 07/16/2014  PCP:  Melinda Crutch, MD  Admit date: 07/16/2014 Discharge date: 07/19/2014  Time spent: 45 minutes  Recommendations for Outpatient Follow-up:  Patient will be discharged to nursing home facility. Patient continue physical as well as occupational therapy as recommended by the facility. Patient should follow-up with her primary care physician, cardiologist, oncologist within 2 weeks of discharge. Patient to continue her medications as prescribed. Patient should follow a heart healthy diet.   Discharge Diagnoses:  Syncope/fall Prolonged QT Dementia Chronic diastolic heart failure Chronic pleural effusions Atrial fibrillation with subtherapeutic INR Breast cancer/lung nodule Hyperlipidemia Hypothyroidism Hypokalemia Depression Decreased urine output  Discharge Condition: Stable  Diet recommendation: Heart healthy  Filed Weights   07/17/14 0434 07/18/14 0601 07/19/14 0630  Weight: 61.4 kg (135 lb 5.8 oz) 59.6 kg (131 lb 6.3 oz) 59.013 kg (130 lb 1.6 oz)    History of present illness:  on 07/16/2014 Leah Guerra is a 80 y.o. female with a history of dementia, congestive heart failure, hypertension, pleural effusions, breast cancer, lung nodule that presented to the emergency department after being found on her kitchen floor. Patient unaware of what occurred. Patient's daughter is at bedside and states that she last spoke to her mother yesterday morning. Patient was then found on her kitchen floor. It is unknown whether patient passed out or stumbled. At the time of admission, patient had no complaints. Imaging conducted in the emergency department showed stable findings in her chest, negative pelvic x-ray, negative CT head and cervical CT. [facility] was called for admission.  Hospital Course:  Possible Syncope/fall -Patient will be admitted for observation -Will admit to  telemetry and monitor for any arrhythmias (patient does have prolonged QT) -Echocardiogram September 2015 showed an improvement in EF, 55-60%, severe biatrial enlargement, [place] reveal pericardial effusions and elevated LV filling pressures -CT of the head: No evidence of acute intracranial abnormality -Carotid doppler: 1-39% ICA stenosis, vertebral artery flow is antegrade -PT and OT consulted and recommended SNF -Consulted Education officer, museum for possible placement -UA and CXR negative for infection  Prolonged QT -Improved -Celexa held, but will restart at lower dose upon discharge  Dementia -Stable and according to daughter who is at bedside  Chronic diastolic heart failure -Echocardiogram as listed above -Continue to monitor daily weights, intake and output -Patient currently appears to be euvolemic and compensated -Continue Lasix and Coreg -BNP 2168, lower than previous. Patient appears to have a baseline BNP in the 2000 range  Chronic Pleural Effusions -Stable  Atrial fibrillation with subtherapeutic INR -Due to patient's fall risk, Coumadin will be held -Patient does have some lacerations, therefore Coumadin will be held at this time -Discussed this with the patient's daughter and she agrees although she understands that having history of cancer as well as atrial fib does place patient increased risk of developing blood clots -Spoke with Dr. Gwenlyn Found, cardiology, via phone, and he agreed with discontinuing coumadin  Breast cancer/lung nodule -Patient follows up with Dr. [name], will notify him of patient's admission -Continue Femara  Hyperlipidemia -Continue statin  Hypothyroidism -TSH 15.77 -Continue Synthroid, however will increase the dose slightly to 133mcg -Free T4 1.34  Hypokalemia -Resolved, likely secondary to diuretics, will continue to replace as needed -Magnesium 2  Depression -Will hold Celexa (due to prolonged QT)  Decreased urine output -Continue  to monitor output  Procedures: Carotid Doppler:1-39% ICA stenosis, vertebral artery flow is antegrade  Consultations: Dr. Gwenlyn Found, Cardiology, via phone  Discharge Exam: Filed Vitals:   07/19/14 1106  BP: 114/59  Pulse: 84  Temp:   Resp:      General: Well developed, well nourished, NAD, appears stated age  59: [name], bruising on nasal bridge,mucous membranes moist.  Cardiovascular: S1 S2 auscultated, Irregular, 2/6 SEM  Respiratory: Clear to auscultation bilaterally with equal chest rise  Abdomen: Soft, nontender, nondistended, + bowel sounds  Extremities: warm dry without cyanosis clubbing or edema  Neuro: AAO to self, no focal deficits  Psych: Pleasant  Discharge Instructions      Discharge Instructions    Discharge instructions    Complete by:  As directed   Patient will be discharged to nursing home facility. Patient continue physical as well as occupational therapy as recommended by the facility. Patient should follow-up with her primary care physician, cardiologist, oncologist within 2 weeks of discharge. Patient to continue her medications as prescribed. Patient should follow a heart healthy diet.            Medication List    STOP taking these medications        carvedilol 10 MG 24 hr capsule  Commonly known as:  COREG CR     warfarin 2 MG tablet  Commonly known as:  COUMADIN     warfarin 4 MG tablet  Commonly known as:  COUMADIN      TAKE these medications        atorvastatin 40 MG tablet  Commonly known as:  LIPITOR  Take 1 tablet (40 mg total) by mouth daily.     carvedilol 3.125 MG tablet  Commonly known as:  COREG  Take 1 tablet (3.125 mg total) by mouth 2 (two) times daily with a meal.     citalopram 20 MG tablet  Commonly known as:  CELEXA  Take 0.5 tablets (10 mg total) by mouth daily.     furosemide 40 MG tablet  Commonly known as:  LASIX  Take 40 mg by mouth daily.     letrozole 2.5 MG tablet  Commonly known as:  FEMARA    Take 1 tablet (2.5 mg total) by mouth daily.     levothyroxine 125 MCG tablet  Commonly known as:  SYNTHROID, LEVOTHROID  Take 1 tablet (125 mcg total) by mouth daily before breakfast.     zolpidem 5 MG tablet  Commonly known as:  AMBIEN  Take 0.5 tablets (2.5 mg total) by mouth at bedtime.       No Known Allergies Follow-up Information    Follow up with  Melinda Crutch, MD. Schedule an appointment as soon as possible for a visit in 1 week.   Specialty:  Family Medicine   Why:  Hospital followup   Contact information:   [address] [place] [address] [phone]        The results of significant diagnostics from this hospitalization (including imaging, microbiology, ancillary and laboratory) are listed below for reference.    Significant Diagnostic Studies: Dg Chest 1 View  07/16/2014   CLINICAL DATA:  Fall, congestive heart failure  EXAM: CHEST - 1 VIEW  COMPARISON:  05/30/2014, CT 8 3,015, PET-CT 03/11/2014  FINDINGS: Stable enlarged cardiac silhouette. There are bilateral pleural effusions not changed. There is chronic interstitial lung disease. There is increase in linear opacities the left right lung. There is nodularity in the right lower lobe which is not changed. These correspond to nodule described on comparison CT of 02/28/2014 and PET-CT . Stable consolidation at the left  lung apex.  IMPRESSION: 1. Interval increased interstitial edema. 2. Stable cardiomegaly and bilateral pleural effusions. 3. Stable nodularity in the right lower lobe and consolidation in the left apex   Electronically Signed   By: Suzy Bouchard M.D.   On: 07/16/2014 14:44   Dg Pelvis 1-2 Views  07/16/2014   CLINICAL DATA:  Fall; Pain appears to be in hips and pelvis  EXAM: PELVIS - 1-2 VIEW  COMPARISON:  Radiograph 03/29/2014  FINDINGS: Left hip prosthetic noted. No evidence of fracture or dislocation of left right hip. No pelvic or sacral fracture.  IMPRESSION: No fracture or dislocation.   No pelvic fracture.   Electronically Signed   By: Suzy Bouchard M.D.   On: 07/16/2014 14:41   Ct Head Wo Contrast  07/16/2014   CLINICAL DATA:  Fall, history of breast cancer  EXAM: CT HEAD WITHOUT CONTRAST  CT CERVICAL SPINE WITHOUT CONTRAST  TECHNIQUE: Multidetector CT imaging of the head and cervical spine was performed following the standard protocol without intravenous contrast. Multiplanar CT image reconstructions of the cervical spine were also generated.  COMPARISON:  None.  FINDINGS: CT HEAD FINDINGS  No evidence of parenchymal hemorrhage or extra-axial fluid collection. No mass lesion, mass effect, or midline shift.  No CT evidence of acute infarction.  Subcortical white matter and periventricular small vessel ischemic changes. Mild intracranial atherosclerosis.  Global cortical atrophy with secondary ventricular prominence.  The visualized paranasal sinuses are essentially clear. The mastoid air cells are unopacified.  No evidence of calvarial fracture.  CT CERVICAL SPINE FINDINGS  Normal cervical lordosis.  No evidence of fracture or dislocation. Vertebral body heights are maintained. Dens appears intact.  No prevertebral soft tissue swelling.  Moderate degenerative changes of the mid cervical spine.  Visualized thyroid is unremarkable.  Stable radiation changes versus pleural parenchymal scarring in the left lung apex. Right apical pleural parenchymal scarring.  IMPRESSION: No evidence of acute intracranial abnormality. Atrophy with small vessel ischemic changes and intracranial atherosclerosis.  No evidence of traumatic injury to the cervical spine. Moderate multilevel degenerative changes.   Electronically Signed   By: Julian Hy M.D.   On: 07/16/2014 14:15   Ct Cervical Spine Wo Contrast  07/16/2014   CLINICAL DATA:  Fall, history of breast cancer  EXAM: CT HEAD WITHOUT CONTRAST  CT CERVICAL SPINE WITHOUT CONTRAST  TECHNIQUE: Multidetector CT imaging of the head and cervical spine  was performed following the standard protocol without intravenous contrast. Multiplanar CT image reconstructions of the cervical spine were also generated.  COMPARISON:  None.  FINDINGS: CT HEAD FINDINGS  No evidence of parenchymal hemorrhage or extra-axial fluid collection. No mass lesion, mass effect, or midline shift.  No CT evidence of acute infarction.  Subcortical white matter and periventricular small vessel ischemic changes. Mild intracranial atherosclerosis.  Global cortical atrophy with secondary ventricular prominence.  The visualized paranasal sinuses are essentially clear. The mastoid air cells are unopacified.  No evidence of calvarial fracture.  CT CERVICAL SPINE FINDINGS  Normal cervical lordosis.  No evidence of fracture or dislocation. Vertebral body heights are maintained. Dens appears intact.  No prevertebral soft tissue swelling.  Moderate degenerative changes of the mid cervical spine.  Visualized thyroid is unremarkable.  Stable radiation changes versus pleural parenchymal scarring in the left lung apex. Right apical pleural parenchymal scarring.  IMPRESSION: No evidence of acute intracranial abnormality. Atrophy with small vessel ischemic changes and intracranial atherosclerosis.  No evidence of traumatic injury to the cervical spine. Moderate  multilevel degenerative changes.   Electronically Signed   By: Julian Hy M.D.   On: 07/16/2014 14:15    Microbiology: No results found for this or any previous visit (from the past 240 hour(s)).   Labs: Basic Metabolic Panel:  Recent Labs Lab 07/16/14 1219 07/17/14 0411 07/18/14 0405  NA 144 140 141  K 3.1* 3.4* 4.0  CL 101 100 103  CO2 29 29 27   GLUCOSE 89 97 101*  BUN 10 11 9   CREATININE 0.81 0.91 0.81  CALCIUM 9.5 8.9 9.0  MG  --  2.0  --    Liver Function Tests:  Recent Labs Lab 07/16/14 1219  AST 21  ALT 9  ALKPHOS 114  BILITOT 0.8  PROT 8.1  ALBUMIN 3.3*   No results for input(s): LIPASE, AMYLASE in the  last 168 hours. No results for input(s): AMMONIA in the last 168 hours. CBC:  Recent Labs Lab 07/16/14 1219 07/17/14 0411 07/18/14 0405  WBC 6.1 5.1 5.6  NEUTROABS 4.2  --   --   HGB 13.3 11.8* 12.4  HCT 42.2 37.7 39.8  MCV 91.3 93.3 91.9  PLT 196 226 234   Cardiac Enzymes: No results for input(s): CKTOTAL, CKMB, CKMBINDEX, TROPONINI in the last 168 hours. BNP: BNP (last 3 results)  Recent Labs  03/28/14 0840 07/17/14 0411  PROBNP 3172.0* 2168.0*   CBG:  Recent Labs Lab 07/16/14 1158 07/19/14 0609  GLUCAP 71 97       Signed:  Anyelin Mogle  [facility] 07/19/2014, 12:23 PM

## 2014-07-19 NOTE — Progress Notes (Signed)
DC IV, DC Tele, DC to SNF. Discharge instructions and home home medications prepared and handled by Education officer, museum. Patient leaving unit via EMS and appears in no acute distress.

## 2014-07-19 NOTE — Progress Notes (Signed)
UR completed 

## 2014-07-20 ENCOUNTER — Non-Acute Institutional Stay (SKILLED_NURSING_FACILITY): Payer: Medicare Other | Admitting: Internal Medicine

## 2014-07-20 DIAGNOSIS — E785 Hyperlipidemia, unspecified: Secondary | ICD-10-CM

## 2014-07-20 DIAGNOSIS — I5032 Chronic diastolic (congestive) heart failure: Secondary | ICD-10-CM

## 2014-07-20 DIAGNOSIS — R55 Syncope and collapse: Secondary | ICD-10-CM

## 2014-07-20 DIAGNOSIS — I482 Chronic atrial fibrillation, unspecified: Secondary | ICD-10-CM

## 2014-07-20 DIAGNOSIS — I10 Essential (primary) hypertension: Secondary | ICD-10-CM

## 2014-07-20 DIAGNOSIS — F0390 Unspecified dementia without behavioral disturbance: Secondary | ICD-10-CM

## 2014-07-20 DIAGNOSIS — F329 Major depressive disorder, single episode, unspecified: Secondary | ICD-10-CM

## 2014-07-20 DIAGNOSIS — F32A Depression, unspecified: Secondary | ICD-10-CM

## 2014-07-20 DIAGNOSIS — E039 Hypothyroidism, unspecified: Secondary | ICD-10-CM

## 2014-07-20 DIAGNOSIS — R5381 Other malaise: Secondary | ICD-10-CM

## 2014-07-20 DIAGNOSIS — J90 Pleural effusion, not elsewhere classified: Secondary | ICD-10-CM

## 2014-07-20 DIAGNOSIS — C50919 Malignant neoplasm of unspecified site of unspecified female breast: Secondary | ICD-10-CM

## 2014-07-20 NOTE — Progress Notes (Signed)
Patient ID: Leah Guerra, female   DOB: 1924/09/05, 80 y.o.   MRN: [id number]     [facility]   PCP:  Melinda Crutch, MD   No Known Allergies  Chief Complaint  Patient presents with  . New Admit To SNF     HPI:  80 y/o female patient is here for STR post hospital admission from 07/16/14-07/19/14 with a syncope.  CT head was negative for acute abnormality. arrythmia was ruled out on telemetry. Echocardiogram from sept 2015 was normal, it was not repeated. Carotid doppler: 1-39% ICA stenosis, vertebral artery flow is antegrade. Physical therapy was recommended for her. Infection was ruled out.  She has history of dementia, congestive heart failure, hypertension, pleural effusions, breast cancer, lung nodule  She is seen in her room today, she is sitting on her wheelchair, pleasantly confused and in no distress. She denies any complaints. No concern from staff  Review of Systems:  Constitutional: Negative for fever, chills HENT: Negative for congestion Respiratory: Negative for cough, sputum production, shortness of breath and wheezing.   Cardiovascular: Negative for chest pain, palpitations, leg swelling.  Gastrointestinal: Negative for heartburn, nausea, vomiting, abdominal pain Genitourinary: Negative for dysuria Musculoskeletal: Negative for back pain Neurological: Negative for dizziness, headaches.  Psychiatric/Behavioral: has memory loss  Past Medical History  Diagnosis Date  . Pleural effusion   . Hypertension   . CHF (congestive heart failure)   . Atrial fibrillation   . Hypothyroid   . Breast cancer   . Lung nodule   . Hyperlipidemia    Past Surgical History  Procedure Laterality Date  . Joint replacement    . Masectomy Left   . Fracture surgery     Social History:   reports that she has never smoked. She has never used smokeless tobacco. She reports that she does not drink alcohol or use illicit drugs.  No family  history on file.  Medications: Patient's Medications  New Prescriptions   No medications on file  Previous Medications   ATORVASTATIN (LIPITOR) 40 MG TABLET    Take 1 tablet (40 mg total) by mouth daily.   CARVEDILOL (COREG) 3.125 MG TABLET    Take 1 tablet (3.125 mg total) by mouth 2 (two) times daily with a meal.   CITALOPRAM (CELEXA) 20 MG TABLET    Take 0.5 tablets (10 mg total) by mouth daily.   FUROSEMIDE (LASIX) 40 MG TABLET    Take 40 mg by mouth daily.   LETROZOLE (FEMARA) 2.5 MG TABLET    Take 1 tablet (2.5 mg total) by mouth daily.   LEVOTHYROXINE (SYNTHROID, LEVOTHROID) 125 MCG TABLET    Take 1 tablet (125 mcg total) by mouth daily before breakfast.   ZOLPIDEM (AMBIEN) 5 MG TABLET    Take 0.5 tablets (2.5 mg total) by mouth at bedtime.  Modified Medications   No medications on file  Discontinued Medications   No medications on file     Physical Exam: Filed Vitals:   07/20/14 1614  BP: 129/83  Pulse: 77  Temp: 98.2 F (36.8 C)  Resp: 18  SpO2: 93%    General- elderly female in no acute distress, thin built Head- atraumatic, normocephalic Eyes- PERRLA, EOMI, no pallor, no icterus, no discharge Neck- no cervical lymphadenopathy Throat- moist mucus membrane Cardiovascular- normal s1,s2, no murmurs Respiratory- bilateral clear to auscultation, no wheeze, no rhonchi, no crackles, no use of accessory muscles Abdomen- bowel sounds present, soft, non tender Musculoskeletal- able to  move all 4 extremities, no leg edema, generalized weakness Neurological- no focal deficit Skin- warm and dry, easy bruising, skin tear in both hands Psychiatry- alert and oriented to person and place, calm this visit   Labs reviewed: Basic Metabolic Panel:  Recent Labs  03/30/14 1133  07/16/14 1219 07/17/14 0411 07/18/14 0405  NA  --   < > 144 140 141  K  --   < > 3.1* 3.4* 4.0  CL  --   < > 101 100 103  CO2  --   < > 29 29 27   GLUCOSE  --   < > 89 97 101*  BUN  --   < > 10 11  9   CREATININE  --   < > 0.81 0.91 0.81  CALCIUM  --   < > 9.5 8.9 9.0  MG 2.0  --   --  2.0  --   < > = values in this interval not displayed. Liver Function Tests:  Recent Labs  03/28/14 0840 03/29/14 0415 07/16/14 1219  AST 45* 36 21  ALT 26 21 9   ALKPHOS 111 105 114  BILITOT 0.9 0.6 0.8  PROT 8.3 7.0 8.1  ALBUMIN 3.2* 2.6* 3.3*    Recent Labs  03/28/14 0840  LIPASE 42   No results for input(s): AMMONIA in the last 8760 hours. CBC:  Recent Labs  03/28/14 0840  07/16/14 1219 07/17/14 0411 07/18/14 0405  WBC 12.2*  < > 6.1 5.1 5.6  NEUTROABS 9.8*  --  4.2  --   --   HGB 13.3  < > 13.3 11.8* 12.4  HCT 40.9  < > 42.2 37.7 39.8  MCV 88.5  < > 91.3 93.3 91.9  PLT 301  < > 196 226 234  < > = values in this interval not displayed. Cardiac Enzymes:  Recent Labs  03/28/14 0840 03/30/14 1133  TROPONINI <0.30 <0.30   BNP: Invalid input(s): POCBNP CBG:  Recent Labs  03/11/14 1353 07/16/14 1158 07/19/14 0609  GLUCAP 96 71 97    Assessment/Plan  deconditioning Will have her work with physical therapy and occupational therapy team to help with gait training and muscle strengthening exercises.fall precautions. Skin care. Encourage to be out of bed.   Syncope No cause identified. Continue rehabilitation for muscle strengthening exercise  Dementia Persists, pleasantly confused. Monitor for skin tear, pressure sores, fall and provide assistance with her ADLs. Monitor and encourage po intake  Depression Continue celexa 10 mg daily for now  Chronic diastolic heart failure euvolemic on exam. Continue lasix and coreg current dosing. Monitor bmp and weight  Chronic Pleural Effusions Stable, monitor clinically  Atrial fibrillation Continue coreg for rate control, off coumadin with fall risk  Breast cancer/lung nodule Continue Femara and f/u with dr Learta Codding  Hyperlipidemia Continue statin  Hypothyroidism Continue Synthroid,  stable  Insomnia Continue ambien 2.5 mg daily   Goals of care: short term rehabilitation    Labs/tests ordered: bmp in 1 week    [name], MD  [facility] (Monday-Friday 8 am - 5 pm) [phone] (afterhours)

## 2014-07-21 NOTE — Clinical Social Work Placement (Addendum)
    Clinical Social Work Department CLINICAL SOCIAL WORK PLACEMENT NOTE 07/19/2014  Patient:  Leah Guerra, Leah Guerra  Account Number:  [id number] Admit date:  07/16/2014  Clinical Social Worker:  Butch Penny Shuntavia Yerby, LCSW  Date/time:  07/18/2014 11:10 AM  Clinical Social Work is seeking post-discharge placement for this patient at the following level of care:   SKILLED NURSING   (*CSW will update this form in Epic as items are completed)   07/18/2014  Patient/family provided with [place] Department of Clinical Social Work's list of facilities offering this level of care within the geographic area requested by the patient (or if unable, by the patient's family).  07/18/2014  Patient/family informed of their freedom to choose among providers that offer the needed level of care, that participate in Medicare, Medicaid or managed care program needed by the patient, have an available bed and are willing to accept the patient.  07/18/2014  Patient/family informed of [facility]' ownership interest in [facility], as well as of the fact that they are under no obligation to receive care at this facility.  PASARR submitted to EDS on 07/18/2014 PASARR number received on 07/18/2014  FL2 transmitted to all facilities in geographic area requested by pt/family on  07/18/2014 FL2 transmitted to all facilities within larger geographic area on   Patient informed that his/her managed care company has contracts with or will negotiate with  certain facilities, including the following:   [employer] Medicare auth required and obtained  [id number] on 07/19/14     Patient/family informed of bed offers received:  07/18/2014 Patient chooses bed at [facility] Physician recommends and patient chooses bed at    Patient to be transferred to [facility] on  07/19/2014 Patient to be transferred to facility by [facility]) Patient and family notified of transfer on 07/19/2014 Name of family member  notified:  Daugher:  Gerre Scull  The following physician request were entered in Epic: Physician Request  Please prepare priority discharge summary and prescriptions.  Please prepare priority discharge summary, including medications    Additional Comments: 07/19/14  Ok per MD for d/c today to SNF. Patient is alert but pleasantly confused.  Daughter is pleased with obtaining bed at [facility].  Nursing notified to call report. CSW signing off.

## 2014-07-21 NOTE — Clinical Social Work Psychosocial (Addendum)
    Clinical Social Work Department BRIEF PSYCHOSOCIAL ASSESSMENT 07/18/2014  Patient:  Leah Guerra, Leah Guerra     Account Number:  [id number]     Admit date:  07/16/2014  Clinical Social Worker:  Elam Dutch  Date/Time:  07/18/2014 12:50 PM  Referred by:  CSW  Date Referred:  07/18/2014 Referred for  SNF Placement   Other Referral:   Interview type:  Other - See comment Other interview type:   Daughter Gerre Scull    (Patient is pleasantly confused and poor historian)    PSYCHOSOCIAL DATA Living Status:  ALONE Admitted from facility:   Level of care:   Primary support name:  Gerre Scull   [phone] Primary support relationship to patient:  CHILD, ADULT Degree of support available:   Strong support    CURRENT CONCERNS Current Concerns  Post-Acute Placement   Other Concerns:    SOCIAL WORK ASSESSMENT / PLAN 80 year old female admitted from home where she lives alone.  Her daughter [name] states that she lives nearby and provides a great deal of supoport and care to her mother. Physical Therapy now recommends SNF placement and daughter agrees with this. Patient is alert and oriented to person but disoriented other spheres. She is a poor historian. Daughter does not feel she will be safe to return home and MD agrees.  SNF bed search discussed and initaited; Fl2 placed on chart for MD's signature.   Assessment/plan status:  Psychosocial Support/Ongoing Assessment of Needs Other assessment/ plan:   Information/referral to community resources:   SNF bed list provided to daughter    PATIENT'S/FAMILY'S RESPONSE TO PLAN OF CARE: Patient is alert and oriented to person only. She is very pleasant and stated that she wanted her daughter to make d/c decisions for her.  Daughter is extemely supportive but agrees that her mother requires more assistance than she can currently provide and agrees to SNF search. CSW will assist patient and daughter with this process.

## 2014-07-25 ENCOUNTER — Ambulatory Visit: Payer: Medicare Other | Admitting: Pharmacist Clinician (PhC)/ Clinical Pharmacy Specialist

## 2014-07-28 ENCOUNTER — Encounter: Payer: Self-pay | Admitting: Adult Health

## 2014-07-28 ENCOUNTER — Non-Acute Institutional Stay (SKILLED_NURSING_FACILITY): Payer: Medicare Other | Admitting: Adult Health

## 2014-07-28 DIAGNOSIS — E876 Hypokalemia: Secondary | ICD-10-CM

## 2014-07-28 NOTE — Progress Notes (Signed)
Patient ID: Leah Guerra, female   DOB: Aug 24, 1924, 80 y.o.   MRN: [id number]    07/28/2014  Facility:  Nursing Home Location:  [place] Room Number: 944-H LEVEL OF CARE:  SNF (31)   Chief Complaint  Patient presents with  . Acute Visit    Hypokalemia    HISTORY OF PRESENT ILLNESS:  This is an 80 year old female who is being seen due to low potassium - 3.0 . She has history of chronic diastolic heart failure and currently taking Lasix 40 mg by mouth daily. No complaints of muscle problems. Patient is currently having short-term rehabilitation posthospitalization.  PAST MEDICAL HISTORY:  Past Medical History  Diagnosis Date  . Pleural effusion   . Hypertension   . CHF (congestive heart failure)   . Atrial fibrillation   . Hypothyroid   . Breast cancer   . Lung nodule   . Hyperlipidemia     CURRENT MEDICATIONS: Reviewed per MAR/see medication list  No Known Allergies   REVIEW OF SYSTEMS:  GENERAL: no change in appetite, no fatigue, no weight changes, no fever, chills or weakness RESPIRATORY: no cough, SOB, DOE, wheezing, hemoptysis CARDIAC: no chest pain, edema or palpitations GI: no abdominal pain, diarrhea, constipation, heart burn, nausea or vomiting  PHYSICAL EXAMINATION  GENERAL: no acute distress, normal body habitus EYES: conjunctivae normal, sclerae normal, normal eye lids NECK: supple, trachea midline, no neck masses, no thyroid tenderness, no thyromegaly LYMPHATICS: no LAN in the neck, no supraclavicular LAN RESPIRATORY: breathing is even & unlabored, BS CTAB CARDIAC: RRR, no murmur,no extra heart sounds, no edema GI: abdomen soft, normal BS, no masses, no tenderness, no hepatomegaly, no splenomegaly EXTREMITIES: Able to move all 4 extremities PSYCHIATRIC: the patient is alert & oriented to person, affect & behavior appropriate  LABS/RADIOLOGY: 07/27/14  sodium 139 potassium 3.0 glucose 106 BUN 14 creatinine 0.8  calcium 8.9 GFR > 60 Labs reviewed: Basic Metabolic Panel:  Recent Labs  03/30/14 1133  07/16/14 1219 07/17/14 0411 07/18/14 0405  NA  --   < > 144 140 141  K  --   < > 3.1* 3.4* 4.0  CL  --   < > 101 100 103  CO2  --   < > 29 29 27   GLUCOSE  --   < > 89 97 101*  BUN  --   < > 10 11 9   CREATININE  --   < > 0.81 0.91 0.81  CALCIUM  --   < > 9.5 8.9 9.0  MG 2.0  --   --  2.0  --   < > = values in this interval not displayed. Liver Function Tests:  Recent Labs  03/28/14 0840 03/29/14 0415 07/16/14 1219  AST 45* 36 21  ALT 26 21 9   ALKPHOS 111 105 114  BILITOT 0.9 0.6 0.8  PROT 8.3 7.0 8.1  ALBUMIN 3.2* 2.6* 3.3*    Recent Labs  03/28/14 0840  LIPASE 42   CBC:  Recent Labs  03/28/14 0840  07/16/14 1219 07/17/14 0411 07/18/14 0405  WBC 12.2*  < > 6.1 5.1 5.6  NEUTROABS 9.8*  --  4.2  --   --   HGB 13.3  < > 13.3 11.8* 12.4  HCT 40.9  < > 42.2 37.7 39.8  MCV 88.5  < > 91.3 93.3 91.9  PLT 301  < > 196 226 234  < > = values in this interval not displayed.  Cardiac Enzymes:  Recent Labs  03/28/14 0840 03/30/14 1133  TROPONINI <0.30 <0.30   CBG:  Recent Labs  03/11/14 1353 07/16/14 1158 07/19/14 0609  GLUCAP 96 71 97    Dg Chest 1 View  07/16/2014   CLINICAL DATA:  Fall, congestive heart failure  EXAM: CHEST - 1 VIEW  COMPARISON:  05/30/2014, CT 8 3,015, PET-CT 03/11/2014  FINDINGS: Stable enlarged cardiac silhouette. There are bilateral pleural effusions not changed. There is chronic interstitial lung disease. There is increase in linear opacities the left right lung. There is nodularity in the right lower lobe which is not changed. These correspond to nodule described on comparison CT of 02/28/2014 and PET-CT . Stable consolidation at the left lung apex.  IMPRESSION: 1. Interval increased interstitial edema. 2. Stable cardiomegaly and bilateral pleural effusions. 3. Stable nodularity in the right lower lobe and consolidation in the left apex    Electronically Signed   By: Suzy Bouchard M.D.   On: 07/16/2014 14:44   Dg Pelvis 1-2 Views  07/16/2014   CLINICAL DATA:  Fall; Pain appears to be in hips and pelvis  EXAM: PELVIS - 1-2 VIEW  COMPARISON:  Radiograph 03/29/2014  FINDINGS: Left hip prosthetic noted. No evidence of fracture or dislocation of left right hip. No pelvic or sacral fracture.  IMPRESSION: No fracture or dislocation.  No pelvic fracture.   Electronically Signed   By: Suzy Bouchard M.D.   On: 07/16/2014 14:41   Ct Head Wo Contrast  07/16/2014   CLINICAL DATA:  Fall, history of breast cancer  EXAM: CT HEAD WITHOUT CONTRAST  CT CERVICAL SPINE WITHOUT CONTRAST  TECHNIQUE: Multidetector CT imaging of the head and cervical spine was performed following the standard protocol without intravenous contrast. Multiplanar CT image reconstructions of the cervical spine were also generated.  COMPARISON:  None.  FINDINGS: CT HEAD FINDINGS  No evidence of parenchymal hemorrhage or extra-axial fluid collection. No mass lesion, mass effect, or midline shift.  No CT evidence of acute infarction.  Subcortical white matter and periventricular small vessel ischemic changes. Mild intracranial atherosclerosis.  Global cortical atrophy with secondary ventricular prominence.  The visualized paranasal sinuses are essentially clear. The mastoid air cells are unopacified.  No evidence of calvarial fracture.  CT CERVICAL SPINE FINDINGS  Normal cervical lordosis.  No evidence of fracture or dislocation. Vertebral body heights are maintained. Dens appears intact.  No prevertebral soft tissue swelling.  Moderate degenerative changes of the mid cervical spine.  Visualized thyroid is unremarkable.  Stable radiation changes versus pleural parenchymal scarring in the left lung apex. Right apical pleural parenchymal scarring.  IMPRESSION: No evidence of acute intracranial abnormality. Atrophy with small vessel ischemic changes and intracranial atherosclerosis.  No  evidence of traumatic injury to the cervical spine. Moderate multilevel degenerative changes.   Electronically Signed   By: Julian Hy M.D.   On: 07/16/2014 14:15   Ct Cervical Spine Wo Contrast  07/16/2014   CLINICAL DATA:  Fall, history of breast cancer  EXAM: CT HEAD WITHOUT CONTRAST  CT CERVICAL SPINE WITHOUT CONTRAST  TECHNIQUE: Multidetector CT imaging of the head and cervical spine was performed following the standard protocol without intravenous contrast. Multiplanar CT image reconstructions of the cervical spine were also generated.  COMPARISON:  None.  FINDINGS: CT HEAD FINDINGS  No evidence of parenchymal hemorrhage or extra-axial fluid collection. No mass lesion, mass effect, or midline shift.  No CT evidence of acute infarction.  Subcortical white matter and periventricular small  vessel ischemic changes. Mild intracranial atherosclerosis.  Global cortical atrophy with secondary ventricular prominence.  The visualized paranasal sinuses are essentially clear. The mastoid air cells are unopacified.  No evidence of calvarial fracture.  CT CERVICAL SPINE FINDINGS  Normal cervical lordosis.  No evidence of fracture or dislocation. Vertebral body heights are maintained. Dens appears intact.  No prevertebral soft tissue swelling.  Moderate degenerative changes of the mid cervical spine.  Visualized thyroid is unremarkable.  Stable radiation changes versus pleural parenchymal scarring in the left lung apex. Right apical pleural parenchymal scarring.  IMPRESSION: No evidence of acute intracranial abnormality. Atrophy with small vessel ischemic changes and intracranial atherosclerosis.  No evidence of traumatic injury to the cervical spine. Moderate multilevel degenerative changes.   Electronically Signed   By: Julian Hy M.D.   On: 07/16/2014 14:15    ASSESSMENT/PLAN:  Hypokalemia - start KCl 20 MEQ  by mouth daily; BMP in 1 week    [facility], [place]

## 2014-08-05 ENCOUNTER — Encounter: Payer: Self-pay | Admitting: Adult Health

## 2014-08-05 ENCOUNTER — Non-Acute Institutional Stay (SKILLED_NURSING_FACILITY): Payer: Medicare Other | Admitting: Adult Health

## 2014-08-05 DIAGNOSIS — E876 Hypokalemia: Secondary | ICD-10-CM

## 2014-08-05 DIAGNOSIS — J90 Pleural effusion, not elsewhere classified: Secondary | ICD-10-CM

## 2014-08-05 DIAGNOSIS — I482 Chronic atrial fibrillation, unspecified: Secondary | ICD-10-CM

## 2014-08-05 DIAGNOSIS — F0390 Unspecified dementia without behavioral disturbance: Secondary | ICD-10-CM

## 2014-08-05 DIAGNOSIS — C50919 Malignant neoplasm of unspecified site of unspecified female breast: Secondary | ICD-10-CM

## 2014-08-05 DIAGNOSIS — I5032 Chronic diastolic (congestive) heart failure: Secondary | ICD-10-CM

## 2014-08-05 DIAGNOSIS — R55 Syncope and collapse: Secondary | ICD-10-CM

## 2014-08-05 DIAGNOSIS — E785 Hyperlipidemia, unspecified: Secondary | ICD-10-CM

## 2014-08-05 DIAGNOSIS — E039 Hypothyroidism, unspecified: Secondary | ICD-10-CM

## 2014-08-05 NOTE — Progress Notes (Signed)
Patient ID: Leah Guerra, female   DOB: Nov 18, 1924, 81 y.o.   MRN: [id number]   08/05/2014  Facility:  Nursing Home Location:  [place] Room Number: 951-O LEVEL OF CARE:  SNF (31)   Chief Complaint  Patient presents with  . Discharge Note    Physical deconditioning, dementia, chronic diastolic heart failure, atrial fibrillation, breast cancer/nodule, hyperlipidemia, hypothyroidism, insomnia and hypokalemia    HISTORY OF PRESENT ILLNESS:  This is an 81 year old female who is for discharge to ALF. DME:  Standard wheelchair with leg rests and low profile cushion. She has been admitted to [facility] on 07/19/14 from [facility]. She was brought to ED after being found on her kitchen floor. Patient unaware of what happened. Claritin Doppler showed 1-39% ICA stenosis, vertebral artery flow is antegrade. Patient was admitted to this facility for short-term rehabilitation after the patient's recent hospitalization.  Patient has completed SNF rehabilitation and therapy has cleared the patient for discharge.  PAST MEDICAL HISTORY:  Past Medical History  Diagnosis Date  . Pleural effusion   . Hypertension   . CHF (congestive heart failure)   . Atrial fibrillation   . Hypothyroid   . Breast cancer   . Lung nodule   . Hyperlipidemia     CURRENT MEDICATIONS: Reviewed per MAR/see medication list  No Known Allergies   REVIEW OF SYSTEMS: unobtainable due to patient not a good historian  PHYSICAL EXAMINATION  GENERAL: no acute distress, normal body habitus NECK: supple, trachea midline, no neck masses, no thyroid tenderness, no thyromegaly LYMPHATICS: no LAN in the neck, no supraclavicular LAN RESPIRATORY: breathing is even & unlabored, BS CTAB CARDIAC: irregular, no murmur,no extra heart sounds, no edema GI: abdomen soft, normal BS, no masses, no tenderness, no hepatomegaly, no splenomegaly EXTREMITIES: Able to move all 4  extremities PSYCHIATRIC: the patient is alert & oriented to person, affect & behavior appropriate  LABS/RADIOLOGY: 07/27/14  sodium 139 potassium 3.0 glucose 106 BUN 14 creatinine 0.8 calcium 8.9 GFR > 60 Labs reviewed: Basic Metabolic Panel:  Recent Labs  03/30/14 1133  07/16/14 1219 07/17/14 0411 07/18/14 0405  NA  --   < > 144 140 141  K  --   < > 3.1* 3.4* 4.0  CL  --   < > 101 100 103  CO2  --   < > 29 29 27   GLUCOSE  --   < > 89 97 101*  BUN  --   < > 10 11 9   CREATININE  --   < > 0.81 0.91 0.81  CALCIUM  --   < > 9.5 8.9 9.0  MG 2.0  --   --  2.0  --   < > = values in this interval not displayed. Liver Function Tests:  Recent Labs  03/28/14 0840 03/29/14 0415 07/16/14 1219  AST 45* 36 21  ALT 26 21 9   ALKPHOS 111 105 114  BILITOT 0.9 0.6 0.8  PROT 8.3 7.0 8.1  ALBUMIN 3.2* 2.6* 3.3*    Recent Labs  03/28/14 0840  LIPASE 42   CBC:  Recent Labs  03/28/14 0840  07/16/14 1219 07/17/14 0411 07/18/14 0405  WBC 12.2*  < > 6.1 5.1 5.6  NEUTROABS 9.8*  --  4.2  --   --   HGB 13.3  < > 13.3 11.8* 12.4  HCT 40.9  < > 42.2 37.7 39.8  MCV 88.5  < > 91.3 93.3 91.9  PLT 301  < > 196 226 234  < > = values in this interval not displayed.  Cardiac Enzymes:  Recent Labs  03/28/14 0840 03/30/14 1133  TROPONINI <0.30 <0.30   CBG:  Recent Labs  03/11/14 1353 07/16/14 1158 07/19/14 0609  GLUCAP 96 71 97    Dg Chest 1 View  07/16/2014   CLINICAL DATA:  Fall, congestive heart failure  EXAM: CHEST - 1 VIEW  COMPARISON:  05/30/2014, CT 8 3,015, PET-CT 03/11/2014  FINDINGS: Stable enlarged cardiac silhouette. There are bilateral pleural effusions not changed. There is chronic interstitial lung disease. There is increase in linear opacities the left right lung. There is nodularity in the right lower lobe which is not changed. These correspond to nodule described on comparison CT of 02/28/2014 and PET-CT . Stable consolidation at the left lung apex.   IMPRESSION: 1. Interval increased interstitial edema. 2. Stable cardiomegaly and bilateral pleural effusions. 3. Stable nodularity in the right lower lobe and consolidation in the left apex   Electronically Signed   By: Suzy Bouchard M.D.   On: 07/16/2014 14:44   Dg Pelvis 1-2 Views  07/16/2014   CLINICAL DATA:  Fall; Pain appears to be in hips and pelvis  EXAM: PELVIS - 1-2 VIEW  COMPARISON:  Radiograph 03/29/2014  FINDINGS: Left hip prosthetic noted. No evidence of fracture or dislocation of left right hip. No pelvic or sacral fracture.  IMPRESSION: No fracture or dislocation.  No pelvic fracture.   Electronically Signed   By: Suzy Bouchard M.D.   On: 07/16/2014 14:41   Ct Head Wo Contrast  07/16/2014   CLINICAL DATA:  Fall, history of breast cancer  EXAM: CT HEAD WITHOUT CONTRAST  CT CERVICAL SPINE WITHOUT CONTRAST  TECHNIQUE: Multidetector CT imaging of the head and cervical spine was performed following the standard protocol without intravenous contrast. Multiplanar CT image reconstructions of the cervical spine were also generated.  COMPARISON:  None.  FINDINGS: CT HEAD FINDINGS  No evidence of parenchymal hemorrhage or extra-axial fluid collection. No mass lesion, mass effect, or midline shift.  No CT evidence of acute infarction.  Subcortical white matter and periventricular small vessel ischemic changes. Mild intracranial atherosclerosis.  Global cortical atrophy with secondary ventricular prominence.  The visualized paranasal sinuses are essentially clear. The mastoid air cells are unopacified.  No evidence of calvarial fracture.  CT CERVICAL SPINE FINDINGS  Normal cervical lordosis.  No evidence of fracture or dislocation. Vertebral body heights are maintained. Dens appears intact.  No prevertebral soft tissue swelling.  Moderate degenerative changes of the mid cervical spine.  Visualized thyroid is unremarkable.  Stable radiation changes versus pleural parenchymal scarring in the left lung  apex. Right apical pleural parenchymal scarring.  IMPRESSION: No evidence of acute intracranial abnormality. Atrophy with small vessel ischemic changes and intracranial atherosclerosis.  No evidence of traumatic injury to the cervical spine. Moderate multilevel degenerative changes.   Electronically Signed   By: Julian Hy M.D.   On: 07/16/2014 14:15   Ct Cervical Spine Wo Contrast  07/16/2014   CLINICAL DATA:  Fall, history of breast cancer  EXAM: CT HEAD WITHOUT CONTRAST  CT CERVICAL SPINE WITHOUT CONTRAST  TECHNIQUE: Multidetector CT imaging of the head and cervical spine was performed following the standard protocol without intravenous contrast. Multiplanar CT image reconstructions of the cervical spine were also generated.  COMPARISON:  None.  FINDINGS: CT HEAD FINDINGS  No evidence of parenchymal hemorrhage or extra-axial fluid collection. No mass lesion,  mass effect, or midline shift.  No CT evidence of acute infarction.  Subcortical white matter and periventricular small vessel ischemic changes. Mild intracranial atherosclerosis.  Global cortical atrophy with secondary ventricular prominence.  The visualized paranasal sinuses are essentially clear. The mastoid air cells are unopacified.  No evidence of calvarial fracture.  CT CERVICAL SPINE FINDINGS  Normal cervical lordosis.  No evidence of fracture or dislocation. Vertebral body heights are maintained. Dens appears intact.  No prevertebral soft tissue swelling.  Moderate degenerative changes of the mid cervical spine.  Visualized thyroid is unremarkable.  Stable radiation changes versus pleural parenchymal scarring in the left lung apex. Right apical pleural parenchymal scarring.  IMPRESSION: No evidence of acute intracranial abnormality. Atrophy with small vessel ischemic changes and intracranial atherosclerosis.  No evidence of traumatic injury to the cervical spine. Moderate multilevel degenerative changes.   Electronically Signed   By:  Julian Hy M.D.   On: 07/16/2014 14:15    ASSESSMENT/PLAN:  Syncope - no cause identifiedl Chronic pleural effusions - stable Dementia - stable Chronic diastolic heart failure - compensated; continue Lasix 40 mg daily and Coreg 3.1 225 mg by mouth twice a day Atrial fibrillation - rate controlled; Coumadin was recently discontinued Breast cancer/nodule - follows up with Dr. [name]; continue Femara 2.5 mg 1 tab by mouth daily Hyperlipidemia  - continue Lipitor 10 mg 1 tab by mouth daily Hypothyroidism - TSH 15.77, free T4 1 0.34, recently increased her Synthroid to 125 g daily Insomnia - continue Ambien 2.5 mg by mouth daily at bedtime Hypokalemia - continuet KCl 20 MEQ  by mouth daily    I have filled out patient's discharge paperwork and written prescriptions.    DME provided: Standard wheelchair with leg rests and low profile cushion  Total discharge time: Greater than 30 minutes  Discharge time involved coordination of the discharge process with social worker, nursing staff and therapy department. Medical justification for DME verified.    [facility], NP [employer] [phone]

## 2014-08-06 ENCOUNTER — Encounter (HOSPITAL_COMMUNITY): Payer: Self-pay

## 2014-08-06 ENCOUNTER — Emergency Department (HOSPITAL_COMMUNITY): Payer: Medicare Other

## 2014-08-06 ENCOUNTER — Inpatient Hospital Stay (HOSPITAL_COMMUNITY)
Admission: EM | Admit: 2014-08-06 | Discharge: 2014-08-10 | DRG: 175 | Disposition: A | Discharge status: Skilled Nursing Facility | Payer: Medicare Other | Attending: Internal Medicine | Admitting: Internal Medicine

## 2014-08-06 DIAGNOSIS — N289 Disorder of kidney and ureter, unspecified: Secondary | ICD-10-CM | POA: Diagnosis present

## 2014-08-06 DIAGNOSIS — E039 Hypothyroidism, unspecified: Secondary | ICD-10-CM | POA: Diagnosis present

## 2014-08-06 DIAGNOSIS — R0600 Dyspnea, unspecified: Secondary | ICD-10-CM | POA: Diagnosis present

## 2014-08-06 DIAGNOSIS — I482 Chronic atrial fibrillation, unspecified: Secondary | ICD-10-CM | POA: Diagnosis present

## 2014-08-06 DIAGNOSIS — E785 Hyperlipidemia, unspecified: Secondary | ICD-10-CM | POA: Diagnosis present

## 2014-08-06 DIAGNOSIS — I2699 Other pulmonary embolism without acute cor pulmonale: Principal | ICD-10-CM | POA: Diagnosis present

## 2014-08-06 DIAGNOSIS — R627 Adult failure to thrive: Secondary | ICD-10-CM | POA: Diagnosis present

## 2014-08-06 DIAGNOSIS — F0390 Unspecified dementia without behavioral disturbance: Secondary | ICD-10-CM | POA: Diagnosis present

## 2014-08-06 DIAGNOSIS — R0602 Shortness of breath: Secondary | ICD-10-CM

## 2014-08-06 DIAGNOSIS — Z7901 Long term (current) use of anticoagulants: Secondary | ICD-10-CM | POA: Diagnosis not present

## 2014-08-06 DIAGNOSIS — I5032 Chronic diastolic (congestive) heart failure: Secondary | ICD-10-CM | POA: Diagnosis present

## 2014-08-06 DIAGNOSIS — Z66 Do not resuscitate: Secondary | ICD-10-CM | POA: Diagnosis present

## 2014-08-06 DIAGNOSIS — I10 Essential (primary) hypertension: Secondary | ICD-10-CM | POA: Diagnosis present

## 2014-08-06 DIAGNOSIS — J90 Pleural effusion, not elsewhere classified: Secondary | ICD-10-CM | POA: Diagnosis present

## 2014-08-06 DIAGNOSIS — R911 Solitary pulmonary nodule: Secondary | ICD-10-CM | POA: Diagnosis present

## 2014-08-06 DIAGNOSIS — J9601 Acute respiratory failure with hypoxia: Secondary | ICD-10-CM | POA: Diagnosis present

## 2014-08-06 DIAGNOSIS — J9600 Acute respiratory failure, unspecified whether with hypoxia or hypercapnia: Secondary | ICD-10-CM

## 2014-08-06 DIAGNOSIS — Z85118 Personal history of other malignant neoplasm of bronchus and lung: Secondary | ICD-10-CM

## 2014-08-06 DIAGNOSIS — I472 Ventricular tachycardia: Secondary | ICD-10-CM | POA: Diagnosis present

## 2014-08-06 DIAGNOSIS — Z170 Estrogen receptor positive status [ER+]: Secondary | ICD-10-CM | POA: Diagnosis not present

## 2014-08-06 DIAGNOSIS — Z853 Personal history of malignant neoplasm of breast: Secondary | ICD-10-CM | POA: Diagnosis not present

## 2014-08-06 DIAGNOSIS — C50919 Malignant neoplasm of unspecified site of unspecified female breast: Secondary | ICD-10-CM | POA: Diagnosis present

## 2014-08-06 DIAGNOSIS — H9190 Unspecified hearing loss, unspecified ear: Secondary | ICD-10-CM | POA: Diagnosis present

## 2014-08-06 LAB — I-STAT VENOUS BLOOD GAS, ED
Acid-Base Excess: 3 mmol/L — ABNORMAL HIGH (ref 0.0–2.0)
BICARBONATE: 29.8 meq/L — AB (ref 20.0–24.0)
O2 Saturation: 69 %
PCO2 VEN: 51.2 mmHg — AB (ref 45.0–50.0)
PO2 VEN: 38 mmHg (ref 30.0–45.0)
TCO2: 31 mmol/L (ref 0–100)
pH, Ven: 7.373 — ABNORMAL HIGH (ref 7.250–7.300)

## 2014-08-06 LAB — BASIC METABOLIC PANEL
Anion gap: 9 (ref 5–15)
BUN: 9 mg/dL (ref 6–23)
CO2: 27 mmol/L (ref 19–32)
Calcium: 9.3 mg/dL (ref 8.4–10.5)
Chloride: 103 mEq/L (ref 96–112)
Creatinine, Ser: 0.93 mg/dL (ref 0.50–1.10)
GFR, EST AFRICAN AMERICAN: 62 mL/min — AB (ref >90)
GFR, EST NON AFRICAN AMERICAN: 54 mL/min — AB (ref >90)
GLUCOSE: 111 mg/dL — AB (ref 70–99)
Potassium: 4.5 mmol/L (ref 3.5–5.1)
Sodium: 139 mmol/L (ref 135–145)

## 2014-08-06 LAB — BRAIN NATRIURETIC PEPTIDE: B Natriuretic Peptide: 455.4 pg/mL — ABNORMAL HIGH (ref 0.0–100.0)

## 2014-08-06 LAB — I-STAT TROPONIN, ED: TROPONIN I, POC: 0.05 ng/mL (ref 0.00–0.08)

## 2014-08-06 LAB — MRSA PCR SCREENING: MRSA BY PCR: NEGATIVE

## 2014-08-06 LAB — CBC
HCT: 40.5 % (ref 36.0–46.0)
HEMOGLOBIN: 12.8 g/dL (ref 12.0–15.0)
MCH: 28.9 pg (ref 26.0–34.0)
MCHC: 31.6 g/dL (ref 30.0–36.0)
MCV: 91.4 fL (ref 78.0–100.0)
PLATELETS: 253 10*3/uL (ref 150–400)
RBC: 4.43 MIL/uL (ref 3.87–5.11)
RDW: 14.5 % (ref 11.5–15.5)
WBC: 7.2 10*3/uL (ref 4.0–10.5)

## 2014-08-06 LAB — D-DIMER, QUANTITATIVE (NOT AT ARMC): D DIMER QUANT: 17.41 ug{FEU}/mL — AB (ref 0.00–0.48)

## 2014-08-06 LAB — HEPARIN LEVEL (UNFRACTIONATED): Heparin Unfractionated: 0.62 IU/mL (ref 0.30–0.70)

## 2014-08-06 MED ORDER — SODIUM CHLORIDE 0.9 % IV SOLN
250.0000 mL | INTRAVENOUS | Status: DC | PRN
  Administered 2014-08-06: 250 mL via INTRAVENOUS

## 2014-08-06 MED ORDER — QUETIAPINE 12.5 MG HALF TABLET
12.5000 mg | ORAL_TABLET | Freq: Every day | ORAL | Status: DC
  Administered 2014-08-06 – 2014-08-09 (×4): 12.5 mg via ORAL
  Filled 2014-08-06 (×6): qty 1

## 2014-08-06 MED ORDER — HEPARIN (PORCINE) IN NACL 100-0.45 UNIT/ML-% IJ SOLN
1000.0000 [IU]/h | INTRAMUSCULAR | Status: AC
  Administered 2014-08-06: 1000 [IU]/h via INTRAVENOUS
  Administered 2014-08-07 – 2014-08-08 (×2): 900 [IU]/h via INTRAVENOUS
  Administered 2014-08-10: 1000 [IU]/h via INTRAVENOUS
  Filled 2014-08-06 (×8): qty 250

## 2014-08-06 MED ORDER — LETROZOLE 2.5 MG PO TABS
2.5000 mg | ORAL_TABLET | Freq: Every day | ORAL | Status: DC
  Administered 2014-08-06 – 2014-08-10 (×5): 2.5 mg via ORAL
  Filled 2014-08-06 (×5): qty 1

## 2014-08-06 MED ORDER — METOPROLOL TARTRATE 1 MG/ML IV SOLN
5.0000 mg | Freq: Once | INTRAVENOUS | Status: AC
  Administered 2014-08-06: 5 mg via INTRAVENOUS

## 2014-08-06 MED ORDER — ALUM & MAG HYDROXIDE-SIMETH 200-200-20 MG/5ML PO SUSP: 30.0000 mL | Freq: Four times a day (QID) | ORAL | Status: DC | PRN

## 2014-08-06 MED ORDER — CARVEDILOL 6.25 MG PO TABS
6.2500 mg | ORAL_TABLET | Freq: Two times a day (BID) | ORAL | Status: DC
  Administered 2014-08-06: 6.25 mg via ORAL
  Filled 2014-08-06 (×5): qty 1

## 2014-08-06 MED ORDER — SODIUM CHLORIDE 0.9 % IJ SOLN: 3.0000 mL | Freq: Two times a day (BID) | INTRAMUSCULAR | Status: DC

## 2014-08-06 MED ORDER — ACETAMINOPHEN 325 MG PO TABS: 650.0000 mg | ORAL_TABLET | Freq: Four times a day (QID) | ORAL | Status: DC | PRN

## 2014-08-06 MED ORDER — SODIUM CHLORIDE 0.9 % IJ SOLN: 3.0000 mL | INTRAMUSCULAR | Status: DC | PRN

## 2014-08-06 MED ORDER — METOPROLOL TARTRATE 1 MG/ML IV SOLN
INTRAVENOUS | Status: AC
  Administered 2014-08-06: 5 mg via INTRAVENOUS
  Filled 2014-08-06: qty 5

## 2014-08-06 MED ORDER — IOHEXOL 350 MG/ML SOLN
80.0000 mL | Freq: Once | INTRAVENOUS | Status: AC | PRN
  Administered 2014-08-06: 80 mL via INTRAVENOUS

## 2014-08-06 MED ORDER — SODIUM CHLORIDE 0.9 % IJ SOLN
3.0000 mL | Freq: Two times a day (BID) | INTRAMUSCULAR | Status: DC
  Administered 2014-08-07 – 2014-08-09 (×3): 3 mL via INTRAVENOUS

## 2014-08-06 MED ORDER — MORPHINE SULFATE 2 MG/ML IJ SOLN: 2.0000 mg | INTRAMUSCULAR | Status: DC | PRN

## 2014-08-06 MED ORDER — ACETAMINOPHEN 650 MG RE SUPP: 650.0000 mg | Freq: Four times a day (QID) | RECTAL | Status: DC | PRN

## 2014-08-06 MED ORDER — ONDANSETRON HCL 4 MG PO TABS: 4.0000 mg | ORAL_TABLET | Freq: Four times a day (QID) | ORAL | Status: DC | PRN

## 2014-08-06 MED ORDER — ONDANSETRON HCL 4 MG/2ML IJ SOLN: 4.0000 mg | Freq: Four times a day (QID) | INTRAMUSCULAR | Status: DC | PRN

## 2014-08-06 MED ORDER — OXYCODONE HCL 5 MG PO TABS: 5.0000 mg | ORAL_TABLET | ORAL | Status: DC | PRN

## 2014-08-06 MED ORDER — CARVEDILOL 3.125 MG PO TABS
3.1250 mg | ORAL_TABLET | Freq: Two times a day (BID) | ORAL | Status: DC
  Filled 2014-08-06 (×2): qty 1

## 2014-08-06 MED ORDER — LEVOTHYROXINE SODIUM 125 MCG PO TABS
125.0000 ug | ORAL_TABLET | Freq: Every day | ORAL | Status: DC
  Administered 2014-08-06 – 2014-08-10 (×5): 125 ug via ORAL
  Filled 2014-08-06 (×7): qty 1

## 2014-08-06 MED ORDER — HEPARIN BOLUS VIA INFUSION
3000.0000 [IU] | Freq: Once | INTRAVENOUS | Status: AC
  Administered 2014-08-06: 3000 [IU] via INTRAVENOUS
  Filled 2014-08-06: qty 3000

## 2014-08-06 NOTE — ED Notes (Signed)
[facility] updated on admission.

## 2014-08-06 NOTE — Progress Notes (Signed)
ANTICOAGULATION CONSULT NOTE - Follow Up Consult  Pharmacy Consult for Heparin  Indication: pulmonary embolus  No Known Allergies  Patient Measurements: Height: 5\' 5"  (165.1 cm) Weight: 134 lb 7.7 oz (61 kg) IBW/kg (Calculated) : 57  Vital Signs: Temp: 97.8 F (36.6 C) (01/09 2001) Temp Source: Oral (01/09 2001) BP: 145/92 mmHg (01/09 2150) Pulse Rate: 100 (01/09 2150)  Labs:  Recent Labs  08/06/14 0956 08/06/14 2254  HGB 12.8  --   HCT 40.5  --   PLT 253  --   HEPARINUNFRC  --  0.62  CREATININE 0.93  --     Estimated Creatinine Clearance: 38.3 mL/min (by C-G formula based on Cr of 0.93).    Assessment: Extensive PE, HL is 0.62, other labs as above.   Goal of Therapy:  Heparin level 0.3-0.7 units/ml Monitor platelets by anticoagulation protocol: Yes   Plan:  -Continue heparin at 1000 units/hr -AM HL -Daily CBC/HL -Monitor for bleeding  Narda Bonds 08/06/2014,11:43 PM

## 2014-08-06 NOTE — ED Notes (Signed)
Dr. Tomi Bamberger updated pt. On plan of care.

## 2014-08-06 NOTE — H&P (Addendum)
[facility] History and Physical  [facility] YOV:[id number] DOB: 24-Sep-1924 DOA: 08/06/2014  Referring physician:  PCP: [name], MD   Chief Complaint: Shortness of breath  HPI: Leah Guerra is a 81 y.o. female with multiple comorbidities including advanced dementia, chronic diastolic congestive heart failure, atrial fibrillation previously on anticoagulation, history recurrent falls and history of lung cancer who was recently discharged from our service on 07/19/2014. At that time she was admitted for possible syncope. She was found by family members on the kitchen floor as patient cannot provide history. During that hospitalization anticoagulation was discontinued given patient's high fall risk. He presents to the emergency room as a transfer from her skilled nursing facility having complaints of shortness of breath. She was noted by staff members to have nonexertional dyspnea this morning. History is limited given advanced dementia. Initial lab work performed in the emergency department revealed an elevated d-dimer of 17.41 as this was followed up with a CT scan of lungs with IV contrast. This study revealed extensive bilateral pulmonary emboli with CT evidence of right heart strain. Findings appear to be consistent with submassive PE.                                                                                                   Review of Systems:  Cannot obtain reliable review of systems given advanced dementia  Past Medical History  Diagnosis Date  . Pleural effusion   . Hypertension   . CHF (congestive heart failure)   . Atrial fibrillation   . Hypothyroid   . Breast cancer   . Lung nodule   . Hyperlipidemia    Past Surgical History  Procedure Laterality Date  . Joint replacement    . Masectomy Left   . Fracture surgery     Social History:  reports that she has never smoked. She has never used smokeless tobacco. She reports that she does not  drink alcohol or use illicit drugs.  No Known Allergies  No family history on file.   Prior to Admission medications   Medication Sig Start Date End Date Taking? Authorizing Provider  atorvastatin (LIPITOR) 40 MG tablet Take 1 tablet (40 mg total) by mouth daily. 03/30/13  Yes Lorretta Harp, MD  carvedilol (COREG) 3.125 MG tablet Take 1 tablet (3.125 mg total) by mouth 2 (two) times daily with a meal. 07/19/14  Yes Maryann Mikhail, DO  furosemide (LASIX) 40 MG tablet Take 40 mg by mouth daily.   Yes Historical Provider, MD  letrozole (FEMARA) 2.5 MG tablet Take 1 tablet (2.5 mg total) by mouth daily. 04/12/14  Yes Ladell Pier, MD  levothyroxine (SYNTHROID, LEVOTHROID) 125 MCG tablet Take 1 tablet (125 mcg total) by mouth daily before breakfast. 07/19/14  Yes Maryann Mikhail, DO  potassium chloride SA (K-DUR,KLOR-CON) 20 MEQ tablet Take 20 mEq by mouth daily.   Yes Historical Provider, MD  zolpidem (AMBIEN) 5 MG tablet Take 0.5 tablets (2.5 mg total) by mouth at bedtime. 07/19/14  Yes Cristal Ford, DO   Physical Exam: Danley Danker Vitals:   08/06/14 1130  08/06/14 1145 08/06/14 1223 08/06/14 1329  BP: 153/93 164/99 140/108 145/99  Pulse: 84 95 113 115  Temp:      TempSrc:      Resp: _0 Height:      Weight:      SpO2: 100% 94% 91% 93%    Wt Readings from Last 3 Encounters:  08/06/14 59.421 kg (131 lb)  08/05/14 59.512 kg (131 lb 3.2 oz)  07/28/14 58.786 kg (129 lb 9.6 oz)    General:  Patient is confused, disoriented, however does not appear to be in acute distress awake alert and can follow simple commands for me. Eyes: PERRL, normal lids, irises & conjunctiva, dry oral mucosa ENT: grossly normal hearing, lips & tongue Neck: no LAD, masses or thyromegaly Cardiovascular: Irregular rate and rhythm, tachycardic, no m/r/g. No LE edema. Telemetry: Atrial fibrillation Respiratory: CTA bilaterally, no w/r/r. Normal respiratory effort. Abdomen: soft, ntnd Skin: no rash or  induration seen on limited exam Musculoskeletal: grossly normal tone BUE/BLE Psychiatric: Patient with baseline dementia, poor historian, does not appear to be in acute distress Neurologic: grossly non-focal.          Labs on Admission:  Basic Metabolic Panel:  Recent Labs Lab 08/06/14 0956  NA 139  K 4.5  CL 103  CO2 27  GLUCOSE 111*  BUN 9  CREATININE 0.93  CALCIUM 9.3   Liver Function Tests: No results for input(s): AST, ALT, ALKPHOS, BILITOT, PROT, ALBUMIN in the last 168 hours. No results for input(s): LIPASE, AMYLASE in the last 168 hours. No results for input(s): AMMONIA in the last 168 hours. CBC:  Recent Labs Lab 08/06/14 0956  WBC 7.2  HGB 12.8  HCT 40.5  MCV 91.4  PLT 253   Cardiac Enzymes: No results for input(s): CKTOTAL, CKMB, CKMBINDEX, TROPONINI in the last 168 hours.  BNP (last 3 results)  Recent Labs  03/28/14 0840 07/17/14 0411  PROBNP 3172.0* 2168.0*   CBG: No results for input(s): GLUCAP in the last 168 hours.  Radiological Exams on Admission: Dg Chest 2 View  08/06/2014   CLINICAL DATA:  SOB but the patient denies any chest pains. Patient has a hx of CHF, Pleural effusion, HTN, atrial fibrillation, breast cancer and lung nodule  EXAM: CHEST - 2 VIEW  COMPARISON:  07/16/2014  FINDINGS: Moderate left pleural effusion, increased since previous exam. Small right pleural effusion. Progressive consolidation/ atelectasis at the left lung base. Persistent left apical pleural parenchymal opacities. Surgical clips left axilla. Heart size difficult to assess due to adjacent opacity. Atheromatous aortic arch. 3.9 cm spiculated nodule in the right mid lung without significant change. Interstitial and patchy airspace opacities at the right lung base are stable.  IMPRESSION: 1. Worsening moderate left pleural effusion and left lower lung consolidation/atelectasis. 2. Little change in right mid lung nodule and right basilar patchy interstitial and airspace  opacities.   Electronically Signed   By: Arne Cleveland M.D.   On: 08/06/2014 10:58   Ct Angio Chest Pe W/cm &/or Wo Cm  08/06/2014   CLINICAL DATA:  SOB EPISODE THIS AM WHILE EATING BREAKFAST, HX PLEURAL EFFUSION, HTN, CHF, AFIB, BREAST CA, LUNG NODULE  EXAM: CT ANGIOGRAPHY CHEST WITH CONTRAST  TECHNIQUE: Multidetector CT imaging of the chest was performed using the standard protocol during bolus administration of intravenous contrast. Multiplanar CT image reconstructions and MIPs were obtained to evaluate the vascular anatomy.  CONTRAST:  67m OMNIPAQUE IOHEXOL 350 MG/ML SOLN  COMPARISON:  03/21/2014  FINDINGS: Left arm injection. SVC is patent. There is right atrial enlargement with reflux of contrast into hepatic veins. Right ventricle is dilated compared to the left. Dilated central pulmonary arteries. There is good contrast opacification of pulmonary artery branches. Multiple Segmental filling defects noted in both right and left lower lobe pulmonary artery branches, worse left than right. There is a central more occlusive emboli in the right upper lobe pulmonary artery branch. Incomplete opacification of pulmonary veins. Left atrial enlargement. Scattered coronary calcifications. Poor opacification of the thoracic aorta, with scattered atheromatous plaque in the arch and descending segment, no aneurysm.  Moderately large left and small right pleural effusions. There is fluid in the left major fissure. Sub cm anterior mediastinal, pretracheal and precarinal lymph nodes. Coarse airspace consolidation with air bronchograms in the apices, left greater than right. Spiculated 15 x 16 mm nodule in the right lower lobe slightly less dense than on prior study of 02/28/2014. Many of the peripheral nodular opacities seen in the right middle and lower lobes also less conspicuous. There is extensive atelectasis/consolidation throughout much of the left lower lobe. Minimal spondylitic changes in the mid thoracic spine.  Sternum intact. Surgical clips left axilla.  Review of the MIP images confirms the above findings.  IMPRESSION: 1. Extensive bilateral pulmonary emboli with CT evidence of right heartstrain (RV/LV Ratio greater than 1) consistent with at least submassive (intermediate risk) PE. The presence of right heart strain has been associated with anincreased risk of morbidity and mortality. Consultation with Pulmonary and Critical Care Medicine is recommended. Critical Value/emergent results were called by telephone at the time of interpretation on 08/06/2014 at 1:52 pm to Dr. Dorie Rank , who verbally acknowledged these results. 2. Worsening bilateral pleural effusions left greater than right. 3. Some partial improvement in the nodular opacities in the right middle and lower lobes.   Electronically Signed   By: Arne Cleveland M.D.   On: 08/06/2014 13:53    EKG: Independently reviewed. Atrial fibrillation, QTc of 485, heart rate 93  Assessment/Plan Principal Problem:   PE (pulmonary embolism) Active Problems:   Dyspnea   Chronic atrial fibrillation   Hypertension   Lung nodule with a history of breats cancer- followed by Dr [name]   Chronic diastolic CHF (congestive heart failure)   Pleural effusion   Breast cancer   1. Pulmonary embolism. She was recently hospitalized in the month of December at which time anticoagulation was discontinued due to concerns with recurrent falls. She was noted to be increasing short of breath this morning by staff members at her skilled nursing facility. CT scan of lungs with IV contrast performed in the emergency department now showing extensive bilateral pulmonary emboli with evidence of right heart strain. On presentation she is hemodynamically stable with systolic blood pressures in the 140s to 160s in the emergency room. She is awake and alert and is mentating well. Will admit her to the step down unit, start intravenous heparin, supportive supportive care.  2. Acute  hypoxemic respiratory failure. Evidence by a respiratory rate of 28, requiring 3 L of supplemental oxygen via nasal cannula, likely secondary to bilateral pulmonary emboli. Will monitor closely in the step down unit, start her on IV heparin, close monitoring. 3. Atrial fibrillation. Patient having history of chronic atrial fibrillation, with anticoagulation discontinued on recent hospitalization given concerns for recurrent falls. Will continue Coreg 3.125 mg by mouth twice a day, place her on continuous cardiac monitoring, monitor closely the step down unit, start anticoagulation.  4. Chronic diastolic congestive heart failure. Status post transthoracic echocardiogram performed on 03/29/2014 showed preserved ejection fraction of 55-60%, with severely dilated left atrium, severely dilated right atrium. Will continue Lasix 40 mg by mouth daily 5. Hypothyroidism. Will check a TSH, continue Synthroid 125 g by mouth daily 6. History of breast cancer. Status post CT-guided biopsy of right side pleural nodule on 03/24/2014 that confirmed metastatic breast cancer being ER positive PR positive HER-2 negative. She was started on Femara therapy on 04/12/2014. 7. DVT prophylaxis. Fully anticoagulated with intravenous heparin   Code Status: Patient is a DO NOT RESUSCITATE, this was confirmed with her daughter over telephone conversation Family Communication: I spoke with her daughter [name] over telephone  Disposition Plan: I anticipate she'll require greater than 2 nights hospitalization, admit to the inpatient service  Time spent: 70 minutes  Kelvin Cellar [facility] Pager [phone]

## 2014-08-06 NOTE — ED Notes (Signed)
Returned form xray  

## 2014-08-06 NOTE — ED Provider Notes (Signed)
CSN: [id number]     Arrival date & time 08/06/14  1275 History   First MD Initiated Contact with Patient 08/06/14 0935     Chief Complaint  Patient presents with  . Shortness of Breath   HPI Patient presents to the emergency room for evaluation of shortness of breath. The history is limited by the patient's dementia. She is alert and answers questions appropriately but does not recall the episode at the nursing facility. According to staff at the nursing facility, the patient appeared to be having some difficulty while she was eating breakfast this morning. That she was short of breath. The patient denies having any complaints of shortness of breath. Initially when the nurse asked her about chest discomfort she mentioned having some on the right side. When I asked her about having any chest discomfort the patient denied having any pain, pressure or any discomfort.  No complaints of vomiting or diarrhea. No fevers or chills. He shouldn't states she feels well right now Past Medical History  Diagnosis Date  . Pleural effusion   . Hypertension   . CHF (congestive heart failure)   . Atrial fibrillation   . Hypothyroid   . Breast cancer   . Lung nodule   . Hyperlipidemia    Past Surgical History  Procedure Laterality Date  . Joint replacement    . Masectomy Left   . Fracture surgery     No family history on file. History  Substance Use Topics  . Smoking status: Never Smoker   . Smokeless tobacco: Never Used  . Alcohol Use: No   OB History    No data available     Review of Systems  All other systems reviewed and are negative.     Allergies  Review of patient's allergies indicates no known allergies.  Home Medications   Prior to Admission medications   Medication Sig Start Date End Date Taking? Authorizing Provider  atorvastatin (LIPITOR) 40 MG tablet Take 1 tablet (40 mg total) by mouth daily. 03/30/13   Lorretta Harp, MD  carvedilol (COREG) 3.125 MG tablet Take 1 tablet  (3.125 mg total) by mouth 2 (two) times daily with a meal. 07/19/14   Maryann Mikhail, DO  citalopram (CELEXA) 20 MG tablet Take 0.5 tablets (10 mg total) by mouth daily. 07/19/14   Maryann Mikhail, DO  furosemide (LASIX) 40 MG tablet Take 40 mg by mouth daily.    Historical Provider, MD  letrozole (FEMARA) 2.5 MG tablet Take 1 tablet (2.5 mg total) by mouth daily. 04/12/14   Ladell Pier, MD  levothyroxine (SYNTHROID, LEVOTHROID) 125 MCG tablet Take 1 tablet (125 mcg total) by mouth daily before breakfast. 07/19/14   Maryann Mikhail, DO  zolpidem (AMBIEN) 5 MG tablet Take 0.5 tablets (2.5 mg total) by mouth at bedtime. 07/19/14   Maryann Mikhail, DO   BP 148/99 mmHg  Pulse 95  Temp(Src) 97 F (36.1 C) (Oral)  Resp 24  Ht 5' 4"  (1.626 m)  Wt 131 lb (59.421 kg)  BMI 22.47 kg/m2  SpO2 100% Physical Exam  Constitutional: She appears well-developed and well-nourished. No distress.  HENT:  Head: Normocephalic and atraumatic.  Right Ear: External ear normal.  Left Ear: External ear normal.  Eyes: Conjunctivae are normal. Right eye exhibits no discharge. Left eye exhibits no discharge. No scleral icterus.  Neck: Neck supple. No tracheal deviation present.  Cardiovascular: Normal rate and intact distal pulses.  An irregularly irregular rhythm present.  Pulmonary/Chest: Effort normal.  No stridor. No respiratory distress. She has no wheezes. She has rales.  Crackles auscultated left anterior chest  Abdominal: Soft. Bowel sounds are normal. She exhibits no distension. There is no tenderness. There is no rebound and no guarding.  Musculoskeletal: She exhibits no edema or tenderness.  Neurological: She is alert. She has normal strength. No cranial nerve deficit (no facial droop, extraocular movements intact, no slurred speech) or sensory deficit. She exhibits normal muscle tone. She displays no seizure activity. Coordination normal.  Skin: Skin is warm and dry. No rash noted. She is not  diaphoretic.  Psychiatric: She has a normal mood and affect.  Nursing note and vitals reviewed.   ED Course  Procedures (including critical care time) Labs Review Labs Reviewed  BASIC METABOLIC PANEL - Abnormal; Notable for the following:    Glucose, Bld 111 (*)    GFR calc non Af Amer 54 (*)    GFR calc Af Amer 62 (*)    All other components within normal limits  D-DIMER, QUANTITATIVE - Abnormal; Notable for the following:    D-Dimer, Quant 17.41 (*)    All other components within normal limits  BRAIN NATRIURETIC PEPTIDE - Abnormal; Notable for the following:    B Natriuretic Peptide 455.4 (*)    All other components within normal limits  I-STAT VENOUS BLOOD GAS, ED - Abnormal; Notable for the following:    pH, Ven 7.373 (*)    pCO2, Ven 51.2 (*)    Bicarbonate 29.8 (*)    Acid-Base Excess 3.0 (*)    All other components within normal limits  CBC  I-STAT TROPOININ, ED    Imaging Review Dg Chest 2 View  08/06/2014   CLINICAL DATA:  SOB but the patient denies any chest pains. Patient has a hx of CHF, Pleural effusion, HTN, atrial fibrillation, breast cancer and lung nodule  EXAM: CHEST - 2 VIEW  COMPARISON:  07/16/2014  FINDINGS: Moderate left pleural effusion, increased since previous exam. Small right pleural effusion. Progressive consolidation/ atelectasis at the left lung base. Persistent left apical pleural parenchymal opacities. Surgical clips left axilla. Heart size difficult to assess due to adjacent opacity. Atheromatous aortic arch. 3.9 cm spiculated nodule in the right mid lung without significant change. Interstitial and patchy airspace opacities at the right lung base are stable.  IMPRESSION: 1. Worsening moderate left pleural effusion and left lower lung consolidation/atelectasis. 2. Little change in right mid lung nodule and right basilar patchy interstitial and airspace opacities.   Electronically Signed   By: Arne Cleveland M.D.   On: 08/06/2014 10:58     EKG  Interpretation   Date/Time:  Saturday August 06 2014 09:44:44 EST Ventricular Rate:  93 PR Interval:    QRS Duration: 76 QT Interval:  390 QTC Calculation: 485 R Axis:   98 Text Interpretation:  Atrial fibrillation Right axis deviation Borderline  repolarization abnormality Borderline prolonged QT interval Confirmed by  Manley Fason  MD-J, Sharada Albornoz ([id number]) on 08/06/2014 9:51:07 AM     Medications  levothyroxine (SYNTHROID, LEVOTHROID) tablet 125 mcg (125 mcg Oral Given 08/06/14 1719)  letrozole [facility]) tablet 2.5 mg (2.5 mg Oral Given 08/06/14 1718)  sodium chloride 0.9 % injection 3 mL (3 mLs Intravenous Not Given 08/06/14 2200)  sodium chloride 0.9 % injection 3 mL (3 mLs Intravenous Not Given 08/06/14 2200)  sodium chloride 0.9 % injection 3 mL (not administered)  0.9 %  sodium chloride infusion (250 mLs Intravenous New Bag/Given 08/06/14 1459)  acetaminophen (TYLENOL) tablet  650 mg (not administered)    Or  acetaminophen (TYLENOL) suppository 650 mg (not administered)  oxyCODONE (Oxy IR/ROXICODONE) immediate release tablet 5 mg (not administered)  morphine 2 MG/ML injection 2 mg (not administered)  ondansetron (ZOFRAN) tablet 4 mg (not administered)    Or  ondansetron (ZOFRAN) injection 4 mg (not administered)  alum & mag hydroxide-simeth (MAALOX/MYLANTA) 200-200-20 MG/5ML suspension 30 mL (not administered)  heparin ADULT infusion 100 units/mL (25000 units/250 mL) (1,000 Units/hr Intravenous Rate/Dose Verify 08/07/14 0600)  carvedilol (COREG) tablet 6.25 mg (0 mg Oral Hold 08/07/14 0809)  QUEtiapine (SEROQUEL) tablet 12.5 mg (12.5 mg Oral Given 08/06/14 2233)  antiseptic oral rinse (CPC / CETYLPYRIDINIUM CHLORIDE 0.05%) solution 7 mL (not administered)  iohexol (OMNIPAQUE) 350 MG/ML injection 80 mL (80 mLs Intravenous Contrast Given 08/06/14 1316)  heparin bolus via infusion 3,000 Units (3,000 Units Intravenous New Bag/Given 08/06/14 1501)  metoprolol (LOPRESSOR) injection 5 mg (5 mg Intravenous Given  08/06/14 1718)    MDM   Dx: PE, LUngs CA, pleural effusion  Worsening pleural effusion noted on xray.  Previous records reviewed.  History of lung mass and chronic pleural effusions.  CT guided biopsy of a right subpleural nodule 03/24/2014 with pathology confirming metastatic carcinoma-ER positive, PR positive, HER-2/neu negative.   With elevated d dimer today will ct chest to evaluate for PE.  CT showed bilateral PEs.  Heparin started.  Pt remained stable, in no distress, still tachypneic.  Family was not at the bedside to discuss results but earlier they had mentioned pt has known lung ca.   Receiving treatment but not aggressive care.  DNR status.  Admitted to stepdown unit.    Dorie Rank, MD 08/07/14 [phone]

## 2014-08-06 NOTE — ED Notes (Signed)
Pt. Lives at the [facility] and staff felt that pt. Was sob when eating her breakfast.  Pt. Denies any pain or discomfort.  Lungs are clear bilaterally.  Pt. Is alert and oriented per her norm.. Pt. Has a hx of afib and is in afib presently.   No cough or cold symptoms noted.

## 2014-08-06 NOTE — ED Notes (Signed)
Spoke with pt.s daughter [name] and she has been updated by admitting Doctor and informed pt.s daughter what room she will be going too.

## 2014-08-06 NOTE — Progress Notes (Signed)
ANTICOAGULATION CONSULT NOTE - Initial Consult  Pharmacy Consult for Heparin Indication: pulmonary embolus x 2  No Known Allergies  Patient Measurements: Height: 5\' 4"  (162.6 cm) Weight: 131 lb (59.421 kg) IBW/kg (Calculated) : 54.7  Vital Signs: Temp: 97 F (36.1 C) (01/09 0945) Temp Source: Oral (01/09 0945) BP: 145/99 mmHg (01/09 1329) Pulse Rate: 115 (01/09 1329)  Labs:  Recent Labs  08/06/14 0956  HGB 12.8  HCT 40.5  PLT 253  CREATININE 0.93    Estimated Creatinine Clearance: 36.8 mL/min (by C-G formula based on Cr of 0.93).   Medical History: Past Medical History  Diagnosis Date  . Pleural effusion   . Hypertension   . CHF (congestive heart failure)   . Atrial fibrillation   . Hypothyroid   . Breast cancer   . Lung nodule   . Hyperlipidemia     Assessment: 81 year old female to begin heparin for PE x 2.   PMH history includes CHF, HTN, pleural effusions, breast cancer history, lung nodule,  Afib (no anti-coag), hypothyroidism, hyperlipidemia With renal insufficiency  Goal of Therapy:  Heparin level 0.3-0.7 units/ml Monitor platelets by anticoagulation protocol: Yes   Plan:  Heparin 3000 units iv bolus x 1 Heparin drip at 1000 units / hr  Heparin level 8 hours after heparin starts Daily heparin level, CBC  Thank you. Anette Guarneri, PharmD [phone]  08/06/2014,2:37 PM

## 2014-08-06 NOTE — ED Notes (Signed)
Heparin infusion rate and bolus verified with Rodman Key, RN

## 2014-08-06 NOTE — ED Notes (Signed)
Critical lab values reported to dr. Tomi Bamberger

## 2014-08-07 LAB — CBC
HEMATOCRIT: 39 % (ref 36.0–46.0)
Hemoglobin: 12.4 g/dL (ref 12.0–15.0)
MCH: 29 pg (ref 26.0–34.0)
MCHC: 31.8 g/dL (ref 30.0–36.0)
MCV: 91.3 fL (ref 78.0–100.0)
Platelets: 226 10*3/uL (ref 150–400)
RBC: 4.27 MIL/uL (ref 3.87–5.11)
RDW: 14.7 % (ref 11.5–15.5)
WBC: 6.8 10*3/uL (ref 4.0–10.5)

## 2014-08-07 LAB — BASIC METABOLIC PANEL
Anion gap: 9 (ref 5–15)
BUN: 13 mg/dL (ref 6–23)
CHLORIDE: 105 meq/L (ref 96–112)
CO2: 27 mmol/L (ref 19–32)
Calcium: 9 mg/dL (ref 8.4–10.5)
Creatinine, Ser: 1.08 mg/dL (ref 0.50–1.10)
GFR calc Af Amer: 52 mL/min — ABNORMAL LOW (ref >90)
GFR, EST NON AFRICAN AMERICAN: 45 mL/min — AB (ref >90)
GLUCOSE: 117 mg/dL — AB (ref 70–99)
POTASSIUM: 4.1 mmol/L (ref 3.5–5.1)
Sodium: 141 mmol/L (ref 135–145)

## 2014-08-07 LAB — TSH: TSH: 0.622 u[IU]/mL (ref 0.350–4.500)

## 2014-08-07 LAB — HEPARIN LEVEL (UNFRACTIONATED)
Heparin Unfractionated: 0.73 IU/mL — ABNORMAL HIGH (ref 0.30–0.70)
Heparin Unfractionated: 0.55 IU/mL (ref 0.30–0.70)

## 2014-08-07 MED ORDER — CARVEDILOL 3.125 MG PO TABS
3.1250 mg | ORAL_TABLET | Freq: Two times a day (BID) | ORAL | Status: DC
  Administered 2014-08-07 – 2014-08-10 (×6): 3.125 mg via ORAL
  Filled 2014-08-07 (×9): qty 1

## 2014-08-07 MED ORDER — CETYLPYRIDINIUM CHLORIDE 0.05 % MT LIQD
7.0000 mL | Freq: Two times a day (BID) | OROMUCOSAL | Status: DC
  Administered 2014-08-07 – 2014-08-10 (×6): 7 mL via OROMUCOSAL

## 2014-08-07 NOTE — Progress Notes (Signed)
ANTICOAGULATION CONSULT NOTE - Follow Up Consult  Pharmacy Consult for Heparin  Indication: pulmonary embolus  No Known Allergies  Patient Measurements: Height: 5\' 5"  (165.1 cm) Weight: 134 lb 7.7 oz (61 kg) IBW/kg (Calculated) : 57  Vital Signs: Temp: 97.9 F (36.6 C) (01/10 0700) Temp Source: Axillary (01/10 0700) BP: 118/70 mmHg (01/10 0700) Pulse Rate: 57 (01/10 0700)  Labs:  Recent Labs  08/06/14 0956 08/06/14 2254 08/07/14 0315  HGB 12.8  --  12.4  HCT 40.5  --  39.0  PLT 253  --  226  HEPARINUNFRC  --  0.62 0.73*  CREATININE 0.93  --  1.08    Estimated Creatinine Clearance: 33 mL/min (by C-G formula based on Cr of 1.08).    Assessment: 81yo female with renal insufficiency on heparin for extensive PE. H&H stable, PLT WNL, no bleeding noted. HL is slightly supratherapeutic this morning at 0.73, other labs as above.  Goal of Therapy:  Heparin level 0.3-0.7 units/ml Monitor platelets by anticoagulation protocol: Yes   Plan:  -Decrease heparin gtt to 900 units/hr -8hr heparin level @ 1730 -Monitor daily heparin level, CBC, s/sx of bleeding  Drucie Opitz, PharmD Clinical Pharmacy Resident Pager: [phone] 08/07/2014 9:10 AM

## 2014-08-07 NOTE — Progress Notes (Signed)
ANTICOAGULATION CONSULT NOTE - Follow Up Consult  Pharmacy Consult for Heparin  Indication: pulmonary embolus  No Known Allergies  Patient Measurements: Height: 5\' 5"  (165.1 cm) Weight: 134 lb 7.7 oz (61 kg) IBW/kg (Calculated) : 57  Vital Signs: Temp: 98.5 F (36.9 C) (01/10 1500) Temp Source: Oral (01/10 1500) BP: 128/84 mmHg (01/10 1150) Pulse Rate: 76 (01/10 1150)  Labs:  Recent Labs  08/06/14 0956 08/06/14 2254 08/07/14 0315 08/07/14 1756  HGB 12.8  --  12.4  --   HCT 40.5  --  39.0  --   PLT 253  --  226  --   HEPARINUNFRC  --  0.62 0.73* 0.55  CREATININE 0.93  --  1.08  --    Estimated Creatinine Clearance: 33 mL/min (by C-G formula based on Cr of 1.08).  Assessment: 81yo female with renal insufficiency on heparin for extensive PE. H&H stable, PLT WNL, no bleeding noted. HL is slightly supratherapeutic this morning at 0.55, other labs as above.  Goal of Therapy:  Heparin level 0.3-0.7 units/ml Monitor platelets by anticoagulation protocol: Yes   Plan:  - Continue IV heparin gtt at 900 units/hr -Monitor daily heparin level, CBC, s/sx of bleeding  Rober Minion, PharmD., MS Clinical Pharmacist Pager:  [phone] Thank you for allowing pharmacy to be part of this patients care team. 08/07/2014 6:40 PM

## 2014-08-07 NOTE — Progress Notes (Addendum)
Pt HR with infrequent pauses. HR dropped to 36. HR now consistently in 50s, still in Afib. Fredirick Maudlin, NP notified.  HalseyFredirick Maudlin, NP requested for AM Coreg to be held until rounding MD addresses.

## 2014-08-07 NOTE — Progress Notes (Signed)
[place] TEAM 1 - Stepdown/ICU TEAM Progress Note  Leah Guerra EQA:[id number] DOB: [birth year] DOA: 08/06/2014 PCP: [name], MD  Admit HPI / Brief Narrative: 81 y.o. female with advanced dementia, chronic diastolic congestive heart failure, atrial fibrillation previously on anticoagulation, recurrent falls and metastatic breast cancer who was discharged on 07/19/2014 after she was admitted for possible syncope after she was found by family members on the kitchen floor. During that hospitalization anticoagulation was discontinued given patient's high fall risk.   This admit she presented to the emergency room as a transfer from her skilled nursing facility having complaints of shortness of breath. She was noted by staff members to have nonexertional dyspnea. Initial lab work performed in the emergency department revealed an elevated d-dimer of 17.41.  CTA of the chest subsequently revealed extensive bilateral pulmonary emboli with CT evidence of right heart strain.   HPI/Subjective: Pt is resting comfortably in bed.  She is in no apparent distress.  She is confused, but quite pleasant and denies any current complaints.   Assessment/Plan:  Extensive B PE  Cont IV heparin for now - long term anticoag will be challenging as she will be high risk for bleeding complications   Acute hypoxic resp failure Essentially resolved - likely sequelae of above   Advanced dmenentia No agitation at present  Afib w/ bradycardia  Rate currently stable   Chronic diastolic CHF Well compensated at this time   Hypothyroidism TSH normal - no change in tx   Breast CA metastatic to lung CT-guided biopsy of right side pleural nodule on 03/24/2014 confirmed metastatic breast cancer (ER positive PR positive HER-2 negative) - started Femara therapy 04/12/2014 - followed by Dr. [name]   Code Status: DNR / NO CODE BLUE Family Communication: no family present at time of  exam Disposition Plan: SDU   Consultants: none  Procedures: none  Antibiotics: none  DVT prophylaxis: IV heparin   Objective: Blood pressure 118/70, pulse 57, temperature 97.9 F (36.6 C), temperature source Axillary, resp. rate 17, height 5' 5"  (1.651 m), weight 61 kg (134 lb 7.7 oz), SpO2 98 %.  Intake/Output Summary (Last 24 hours) at 08/07/14 0824 Last data filed at 08/07/14 0600  Gross per 24 hour  Intake    180 ml  Output      0 ml  Net    180 ml   Exam: General: No acute respiratory distress Lungs: Clear to auscultation bilaterally without wheezes or crackles Cardiovascular: Regular rate without murmur gallop or rub normal S1 and S2 Abdomen: Nontender, nondistended, soft, bowel sounds positive, no rebound, no ascites, no appreciable mass Extremities: No significant cyanosis, clubbing, or edema bilateral lower extremities  Data Reviewed: Basic Metabolic Panel:  Recent Labs Lab 08/06/14 0956 08/07/14 0315  NA 139 141  K 4.5 4.1  CL 103 105  CO2 27 27  GLUCOSE 111* 117*  BUN 9 13  CREATININE 0.93 1.08  CALCIUM 9.3 9.0    Liver Function Tests: No results for input(s): AST, ALT, ALKPHOS, BILITOT, PROT, ALBUMIN in the last 168 hours. No results for input(s): LIPASE, AMYLASE in the last 168 hours. No results for input(s): AMMONIA in the last 168 hours.  Coags: No results for input(s): INR in the last 168 hours.  Invalid input(s): PT No results for input(s): APTT in the last 168 hours.  CBC:  Recent Labs Lab 08/06/14 0956 08/07/14 0315  WBC 7.2 6.8  HGB 12.8 12.4  HCT 40.5 39.0  MCV 91.4 91.3  PLT 253 226    Cardiac Enzymes: No results for input(s): CKTOTAL, CKMB, CKMBINDEX, TROPONINI in the last 168 hours.   BNP (last 3 results)  Recent Labs  03/28/14 0840 07/17/14 0411  PROBNP 3172.0* 2168.0*    CBG: No results for input(s): GLUCAP in the last 168 hours.  Recent Results (from the past 240 hour(s))  MRSA PCR Screening      Status: None   Collection Time: 08/06/14  4:22 PM  Result Value Ref Range Status   MRSA by PCR NEGATIVE NEGATIVE Final    Comment:        The GeneXpert MRSA Assay (FDA approved for NASAL specimens only), is one component of a comprehensive MRSA colonization surveillance program. It is not intended to diagnose MRSA infection nor to guide or monitor treatment for MRSA infections.      Studies:  Recent x-ray studies have been reviewed in detail by the Attending Physician  Scheduled Meds:  Scheduled Meds: . antiseptic oral rinse  7 mL Mouth Rinse BID  . carvedilol  3.125 mg Oral BID WC  . letrozole  2.5 mg Oral Daily  . levothyroxine  125 mcg Oral QAC breakfast  . QUEtiapine  12.5 mg Oral QHS  . sodium chloride  3 mL Intravenous Q12H  . sodium chloride  3 mL Intravenous Q12H    Time spent on care of this patient: 35 mins   Zarea Diesing T , MD   [facility] Office  [phone] Pager - Text Page per Shea Evans as per below:  On-Call/Text Page:      [email or web address]      password [email or web address]  If 7PM-7AM, please contact night-coverage [email or web address] Password [email or web address] 08/07/2014, 8:24 AM   LOS: 1 day

## 2014-08-08 LAB — CBC
HEMATOCRIT: 39.4 % (ref 36.0–46.0)
Hemoglobin: 12.5 g/dL (ref 12.0–15.0)
MCH: 29.6 pg (ref 26.0–34.0)
MCHC: 31.7 g/dL (ref 30.0–36.0)
MCV: 93.1 fL (ref 78.0–100.0)
Platelets: 227 10*3/uL (ref 150–400)
RBC: 4.23 MIL/uL (ref 3.87–5.11)
RDW: 14.6 % (ref 11.5–15.5)
WBC: 7.5 10*3/uL (ref 4.0–10.5)

## 2014-08-08 LAB — HEPARIN LEVEL (UNFRACTIONATED): Heparin Unfractionated: 0.65 IU/mL (ref 0.30–0.70)

## 2014-08-08 MED ORDER — WARFARIN SODIUM 4 MG PO TABS
4.0000 mg | ORAL_TABLET | Freq: Once | ORAL | Status: AC
  Administered 2014-08-08: 4 mg via ORAL
  Filled 2014-08-08: qty 1

## 2014-08-08 MED ORDER — WARFARIN - PHARMACIST DOSING INPATIENT
Freq: Every day | Status: DC
  Administered 2014-08-08: 1

## 2014-08-08 MED ORDER — CITALOPRAM HYDROBROMIDE 10 MG PO TABS
10.0000 mg | ORAL_TABLET | Freq: Every day | ORAL | Status: DC
  Administered 2014-08-08 – 2014-08-10 (×3): 10 mg via ORAL
  Filled 2014-08-08 (×3): qty 1

## 2014-08-08 NOTE — Clinical Social Work Psychosocial (Signed)
Clinical Social Work Department BRIEF PSYCHOSOCIAL ASSESSMENT 08/08/2014  Patient:  Leah Guerra, Leah Guerra     Account Number:  [id number]     Admit date:  08/06/2014  Clinical Social Worker:  Marciano Sequin  Date/Time:  08/08/2014 01:35 PM  Referred by:  RN  Date Referred:  08/06/2014 Referred for  SNF Placement   Other Referral:   Interview type:  Family Other interview type:   Pt is disorient at times; pt's daughter Gerre Scull [phone]    PSYCHOSOCIAL DATA Living Status:  FACILITY Admitted from facility:  [place] [facility] Level of care:  [place] Primary support name:  Sipe,Nancy Primary support relationship to patient:  CHILD, ADULT Degree of support available:   String Support    CURRENT CONCERNS Current Concerns  Post-Acute Placement   Other Concerns:    SOCIAL WORK ASSESSMENT / PLAN CSW met pt and the pt's daughter [name] at bedside. CSW introduced self and purpose of visit. [name] reported the pt was receiving short-term rehab from [facility]. [name] reported the pt was going to discharge from [facility] then transition to [facility]. CSW left a voice message for Ms Dema Severin at [facility] inquiring if they will accept the pt at discharge.  CSW also spoke with Ivin Booty at [facility]. Ivin Booty reported they will accept the pt only if she needs skilled level of care. CSW provided [place] with contact information for further questions. CSW will continue to follow this pt and assist with discharge as needed.   Assessment/plan status:  Psychosocial Support/Ongoing Assessment of Needs Other assessment/ plan:   Information/referral to community resources:    PATIENT'S/FAMILY'S RESPONSE TO PLAN OF CARE: The pt was laying asleep in the bed. [name] reported being unsure what the pt discharge plan will be. [name] agreed for the pt to return back to [facility] is the pt needs the skilled care.   [place], MSW, [place]

## 2014-08-08 NOTE — Progress Notes (Signed)
Utilization review completed.  

## 2014-08-08 NOTE — Clinical Social Work Note (Signed)
CSW received a call from April from [facility] at [phone]. April reported that she will notify the nurse so they can come out to assist the pt. CSW will continue to follow and support.   [place], MSW, [place]

## 2014-08-08 NOTE — Progress Notes (Signed)
[place] TEAM 1 - Stepdown/ICU TEAM Progress Note  SALIMATA CHRISTENSON QVZ:[id number] DOB: August 16, 1924 DOA: 08/06/2014 PCP: [name], MD  Admit HPI / Brief Narrative: 81 y.o. female with advanced dementia, chronic diastolic congestive heart failure, chronic atrial fibrillation previously on warfarin, recurrent falls and metastatic breast cancer who was discharged on 07/19/2014 after she was admitted for possible syncope after she was found by family members on the kitchen floor. During that hospitalization anticoagulation was discontinued given patient's high fall risk.   This admit she presented to the emergency room as a transfer from her skilled nursing facility having complaints of shortness of breath. She was noted by staff members to have nonexertional dyspnea. Initial lab work performed in the emergency department revealed an elevated d-dimer of 17.41.  CTA of the chest subsequently revealed extensive bilateral pulmonary emboli with CT evidence of right heart strain.   HPI/Subjective: Pt remains alert and quite pleasant, though confused and hard of hearing.  She denies complaints.    Assessment/Plan:  Extensive B PE  Cont IV heparin for now - long term anticoag will be challenging as she will be high risk for bleeding complications, but given new DUAL indication to include AFIB as well as submassive PE, I feel we have little option as the high risk of clot propagation and death is higher than that of a bleeding complication (and a bleeding complication also would possibly be amenable to tx, whereas a repeat clotting event would likely be fatal) - will resume coumadin today w/ Pharmacy dosing - I discussed this at length w/ the pt and her daughter at the bedside   Acute hypoxic resp failure Essentially resolved - likely sequelae of above   Advanced dmenentia No agitation at present  Afib w/ bradycardia  Rate currently stable - having episodes of NSVT, which is not  surprising in setting of signif PE - follow on tele   Chronic diastolic CHF Well compensated at this time   Hypothyroidism TSH normal - no change in tx   Breast CA metastatic to lung CT-guided biopsy of right side pleural nodule on 03/24/2014 confirmed metastatic breast cancer (ER positive PR positive HER-2 negative) - started Femara therapy 04/12/2014 - followed by Dr. [name]   Code Status: DNR / NO CODE BLUE Family Communication: spoke to daughter at bedside at length  Disposition Plan: transfer to tele bed - PT/OT to see - pt was to be moved from her rehab SNF to an AL facility today - suspect she may require SNF level of care at d/c   Consultants: none  Procedures: none  Antibiotics: none  DVT prophylaxis: IV heparin > warfarin  Objective: Blood pressure 132/80, pulse 81, temperature 98.7 F (37.1 C), temperature source Oral, resp. rate 23, height _0  (1.626 m), weight 62.1 kg (136 lb 14.5 oz), SpO2 99 %.  Intake/Output Summary (Last 24 hours) at 08/08/14 1210 Last data filed at 08/08/14 1100  Gross per 24 hour  Intake    322 ml  Output      0 ml  Net    322 ml   Exam: General: No acute respiratory distress Lungs: Clear to auscultation bilaterally without wheezes or crackles Cardiovascular: irreg irreg - rate controlled  Abdomen: Nontender, nondistended, soft, bowel sounds positive, no rebound, no ascites, no appreciable mass Extremities: No significant cyanosis, clubbing, or edema bilateral lower extremities  Data Reviewed: Basic Metabolic Panel:  Recent Labs Lab 08/06/14 0956 08/07/14 0315  NA 139 141  K 4.5  4.1  CL 103 105  CO2 27 27  GLUCOSE 111* 117*  BUN 9 13  CREATININE 0.93 1.08  CALCIUM 9.3 9.0    Liver Function Tests: No results for input(s): AST, ALT, ALKPHOS, BILITOT, PROT, ALBUMIN in the last 168 hours. No results for input(s): LIPASE, AMYLASE in the last 168 hours. No results for input(s): AMMONIA in the last 168  hours.  Coags: No results for input(s): INR in the last 168 hours.  Invalid input(s): PT No results for input(s): APTT in the last 168 hours.  CBC:  Recent Labs Lab 08/06/14 0956 08/07/14 0315 08/08/14 0228  WBC 7.2 6.8 7.5  HGB 12.8 12.4 12.5  HCT 40.5 39.0 39.4  MCV 91.4 91.3 93.1  PLT 253 226 227    BNP (last 3 results)  Recent Labs  03/28/14 0840 07/17/14 0411  PROBNP 3172.0* 2168.0*    Recent Results (from the past 240 hour(s))  MRSA PCR Screening     Status: None   Collection Time: 08/06/14  4:22 PM  Result Value Ref Range Status   MRSA by PCR NEGATIVE NEGATIVE Final    Comment:        The GeneXpert MRSA Assay (FDA approved for NASAL specimens only), is one component of a comprehensive MRSA colonization surveillance program. It is not intended to diagnose MRSA infection nor to guide or monitor treatment for MRSA infections.      Studies:  Recent x-ray studies have been reviewed in detail by the Attending Physician  Scheduled Meds:  Scheduled Meds: . antiseptic oral rinse  7 mL Mouth Rinse BID  . carvedilol  3.125 mg Oral BID WC  . letrozole  2.5 mg Oral Daily  . levothyroxine  125 mcg Oral QAC breakfast  . QUEtiapine  12.5 mg Oral QHS  . sodium chloride  3 mL Intravenous Q12H    Time spent on care of this patient: 35 mins   MCCLUNG,JEFFREY T , MD   [facility] Office  [phone] Pager - Text Page per Shea Evans as per below:  On-Call/Text Page:      [email or web address]      password [email or web address]  If 7PM-7AM, please contact night-coverage [email or web address] Password [email or web address] 08/08/2014, 12:10 PM   LOS: 2 days

## 2014-08-08 NOTE — Progress Notes (Addendum)
ANTICOAGULATION CONSULT NOTE - Follow Up Consult  Pharmacy Consult for Heparin  Indication: pulmonary embolus  No Known Allergies  Patient Measurements: Height: 5\' 4"  (162.6 cm) Weight: 136 lb 14.5 oz (62.1 kg) IBW/kg (Calculated) : 54.7  Vital Signs: Temp: 98.7 F (37.1 C) (01/11 0700) Temp Source: Oral (01/11 0700) BP: 157/101 mmHg (01/11 0825) Pulse Rate: 81 (01/11 0825)  Labs:  Recent Labs  08/06/14 0956  08/07/14 0315 08/07/14 1756 08/08/14 0228  HGB 12.8  --  12.4  --  12.5  HCT 40.5  --  39.0  --  39.4  PLT 253  --  226  --  227  HEPARINUNFRC  --   < > 0.73* 0.55 0.65  CREATININE 0.93  --  1.08  --   --   < > = values in this interval not displayed. Estimated Creatinine Clearance: 31.7 mL/min (by C-G formula based on Cr of 1.08).  Assessment: 81yo female with renal insufficiency on heparin for extensive PE. H&H stable, PLT WNL, no bleeding noted. HL is slightly therapeutic this morning at 0.65, other labs as above. Plans for long term AC tricky due to risk of falls and bleeding  Goal of Therapy:  Heparin level 0.3-0.7 units/ml Monitor platelets by anticoagulation protocol: Yes   Plan:  - Continue IV heparin gtt at 900 units/hr -Monitor daily heparin level, CBC, s/sx of bleeding  Thanks for allowing pharmacy to be a part of this patient's care.  Excell Seltzer, PharmD Clinical Pharmacist, [phone] 08/08/2014 10:21 AM   Addum:  Start coumadin 4mg  po today Daily PT/INR

## 2014-08-08 NOTE — Progress Notes (Signed)
Patient trasfered from 3S to [phone] via strecher; alert and oriented to herself; no complaints of pain; IV  in LAC running Heparin @9ml /hr; skin - abrasion and skin tear bilateral hands; discoloration on BLE. Orient patient to room and unit;   instructed how to use the call bell and  fall risk precautions. Will continue to monitor the patient.

## 2014-08-09 LAB — CBC
HEMATOCRIT: 43 % (ref 36.0–46.0)
HEMOGLOBIN: 13.5 g/dL (ref 12.0–15.0)
MCH: 29.5 pg (ref 26.0–34.0)
MCHC: 31.4 g/dL (ref 30.0–36.0)
MCV: 94.1 fL (ref 78.0–100.0)
Platelets: 262 10*3/uL (ref 150–400)
RBC: 4.57 MIL/uL (ref 3.87–5.11)
RDW: 14.6 % (ref 11.5–15.5)
WBC: 7.3 10*3/uL (ref 4.0–10.5)

## 2014-08-09 LAB — COMPREHENSIVE METABOLIC PANEL
ALBUMIN: 2.9 g/dL — AB (ref 3.5–5.2)
ALK PHOS: 106 U/L (ref 39–117)
ALT: 15 U/L (ref 0–35)
AST: 19 U/L (ref 0–37)
Anion gap: 4 — ABNORMAL LOW (ref 5–15)
BUN: 14 mg/dL (ref 6–23)
CHLORIDE: 106 meq/L (ref 96–112)
CO2: 26 mmol/L (ref 19–32)
Calcium: 8.5 mg/dL (ref 8.4–10.5)
Creatinine, Ser: 0.93 mg/dL (ref 0.50–1.10)
GFR, EST AFRICAN AMERICAN: 62 mL/min — AB (ref >90)
GFR, EST NON AFRICAN AMERICAN: 54 mL/min — AB (ref >90)
Glucose, Bld: 83 mg/dL (ref 70–99)
Potassium: 3.8 mmol/L (ref 3.5–5.1)
Sodium: 136 mmol/L (ref 135–145)
Total Bilirubin: 0.8 mg/dL (ref 0.3–1.2)
Total Protein: 7.5 g/dL (ref 6.0–8.3)

## 2014-08-09 LAB — HEPARIN LEVEL (UNFRACTIONATED)
HEPARIN UNFRACTIONATED: 0.25 [IU]/mL — AB (ref 0.30–0.70)
HEPARIN UNFRACTIONATED: 0.3 [IU]/mL (ref 0.30–0.70)

## 2014-08-09 LAB — PROTIME-INR
INR: 1.16 (ref 0.00–1.49)
Prothrombin Time: 15 seconds (ref 11.6–15.2)

## 2014-08-09 LAB — MAGNESIUM: MAGNESIUM: 2.1 mg/dL (ref 1.5–2.5)

## 2014-08-09 MED ORDER — WARFARIN SODIUM 4 MG PO TABS
4.0000 mg | ORAL_TABLET | Freq: Once | ORAL | Status: AC
  Administered 2014-08-09: 4 mg via ORAL
  Filled 2014-08-09: qty 1

## 2014-08-09 MED ORDER — HYDRALAZINE HCL 20 MG/ML IJ SOLN
2.0000 mg | INTRAMUSCULAR | Status: DC | PRN
  Administered 2014-08-09: 2 mg via INTRAVENOUS
  Filled 2014-08-09: qty 1

## 2014-08-09 NOTE — Progress Notes (Signed)
[place] TEAM 1 - Stepdown/ICU TEAM Progress Note  KAIYAH EBER TKZ:[id number] DOB: 04/07/1925 DOA: 08/06/2014 PCP: [name], MD  Admit HPI / Brief Narrative: 81 y.o. female with advanced dementia, chronic diastolic congestive heart failure, chronic atrial fibrillation previously on warfarin, recurrent falls and metastatic breast cancer who was discharged on 07/19/2014 after she was admitted for possible syncope after she was found by family members on the kitchen floor. During that hospitalization anticoagulation was discontinued given patient's high fall risk.   This admit she presented to the emergency room as a transfer from her skilled nursing facility having complaints of shortness of breath. She was noted by staff members to have nonexertional dyspnea. Initial lab work performed in the emergency department revealed an elevated d-dimer of 17.41.  CTA of the chest subsequently revealed extensive bilateral pulmonary emboli with CT evidence of right heart strain.   HPI/Subjective: Pt alert and quite pleasant, denies pain or sob, reported has not been out of bed since admission. NSVT on tele overnight  Assessment/Plan:  Extensive B PE  Agree with anticoagulation choice, due to easy reversibility of coumadin, accessible to close monitoring at snf. Cont IV heparin for now - long term anticoag will be challenging as she will be high risk for bleeding complications, but given new DUAL indication to include AFIB as well as submassive PE, I feel we have little option as the high risk of clot propagation and death is higher than that of a bleeding complication (and a bleeding complication also would possibly be amenable to tx, whereas a repeat clotting event would likely be fatal) - will resume coumadin today w/ Pharmacy dosing - I discussed this at length w/ the pt and her daughter at the bedside   Acute hypoxic resp failure Essentially resolved - likely sequelae of above    Wean oxygen as tolerated  Advanced dmenentia No agitation at present  Afib w/ bradycardia  Rate currently stable - having episodes of NSVT, which is not surprising in setting of signif PE - follow on tele  Keep K4, mag>2. tsh wnl. On low dose coreg.  Chronic diastolic CHF Well compensated at this time   Hypothyroidism TSH normal - no change in tx   Breast CA metastatic to lung CT-guided biopsy of right side pleural nodule on 03/24/2014 confirmed metastatic breast cancer (ER positive PR positive HER-2 negative) - started Femara therapy 04/12/2014 - followed by Dr. [name]   FTT: PT/OT/SNF  Code Status: DNR / NO CODE BLUE Family Communication: spoke to daughter at bedside at length  Disposition Plan: transfer to tele bed - PT/OT to see - pt was to be moved from her rehab SNF to an AL facility today - suspect she may require SNF level of care at d/c   Consultants: none  Procedures: none  Antibiotics: none  DVT prophylaxis: IV heparin > warfarin  Objective: Blood pressure 140/90, pulse 95, temperature 98.5 F (36.9 C), temperature source Oral, resp. rate 20, height 5' 4"  (1.626 m), weight 62.1 kg (136 lb 14.5 oz), SpO2 99 %.  Intake/Output Summary (Last 24 hours) at 08/09/14 1212 Last data filed at 08/09/14 0900  Gross per 24 hour  Intake    500 ml  Output      0 ml  Net    500 ml   Exam: General: No acute respiratory distress Lungs: Clear to auscultation bilaterally without wheezes or crackles Cardiovascular: irreg irreg - rate controlled  Abdomen: Nontender, nondistended, soft, bowel sounds positive, no  rebound, no ascites, no appreciable mass Extremities: No significant cyanosis, clubbing, or edema bilateral lower extremities  Data Reviewed: Basic Metabolic Panel:  Recent Labs Lab 08/06/14 0956 08/07/14 0315 08/09/14 0640  NA 139 141 136  K 4.5 4.1 3.8  CL 103 105 106  CO2 27 27 26   GLUCOSE 111* 117* 83  BUN 9 13 14   CREATININE 0.93 1.08 0.93   CALCIUM 9.3 9.0 8.5    Liver Function Tests:  Recent Labs Lab 08/09/14 0640  AST 19  ALT 15  ALKPHOS 106  BILITOT 0.8  PROT 7.5  ALBUMIN 2.9*   No results for input(s): LIPASE, AMYLASE in the last 168 hours. No results for input(s): AMMONIA in the last 168 hours.  Coags:  Recent Labs Lab 08/09/14 0640  INR 1.16   No results for input(s): APTT in the last 168 hours.  CBC:  Recent Labs Lab 08/06/14 0956 08/07/14 0315 08/08/14 0228 08/09/14 0640  WBC 7.2 6.8 7.5 7.3  HGB 12.8 12.4 12.5 13.5  HCT 40.5 39.0 39.4 43.0  MCV 91.4 91.3 93.1 94.1  PLT 253 226 227 262    BNP (last 3 results)  Recent Labs  03/28/14 0840 07/17/14 0411  PROBNP 3172.0* 2168.0*    Recent Results (from the past 240 hour(s))  MRSA PCR Screening     Status: None   Collection Time: 08/06/14  4:22 PM  Result Value Ref Range Status   MRSA by PCR NEGATIVE NEGATIVE Final    Comment:        The GeneXpert MRSA Assay (FDA approved for NASAL specimens only), is one component of a comprehensive MRSA colonization surveillance program. It is not intended to diagnose MRSA infection nor to guide or monitor treatment for MRSA infections.      Studies:  Recent x-ray studies have been reviewed in detail by the Attending Physician  Scheduled Meds:  Scheduled Meds: . antiseptic oral rinse  7 mL Mouth Rinse BID  . carvedilol  3.125 mg Oral BID WC  . citalopram  10 mg Oral Daily  . letrozole  2.5 mg Oral Daily  . levothyroxine  125 mcg Oral QAC breakfast  . QUEtiapine  12.5 mg Oral QHS  . sodium chloride  3 mL Intravenous Q12H  . warfarin  4 mg Oral ONCE-1800  . Warfarin - Pharmacist Dosing Inpatient   Does not apply q1800    Time spent on care of this patient: 24 mins   Astryd Pearcy , MD   [facility] Office  [phone] Pager - Text Page per Shea Evans as per below:  On-Call/Text Page:      [email or web address]      password [email or web address]  If 7PM-7AM, please contact  night-coverage [email or web address] Password [email or web address] 08/09/2014, 12:12 PM   LOS: 3 days

## 2014-08-09 NOTE — Clinical Social Work Note (Signed)
PT has recommended SNF, patient and daughter prefer [facility]. Referral sent to facility and authorization for [facility] will be submitted for today.    Liz Beach MSW, [place], [place], [phone]

## 2014-08-09 NOTE — Progress Notes (Addendum)
ANTICOAGULATION CONSULT NOTE - Follow Up Consult  Pharmacy Consult for Heparin and Coumadin Indication: pulmonary embolus  No Known Allergies  Patient Measurements: Height: 5\' 4"  (162.6 cm) Weight: 136 lb 14.5 oz (62.1 kg) IBW/kg (Calculated) : 54.7  Dosing weight: 62.1 kg  Vital Signs: Temp: 98.5 F (36.9 C) (01/12 0542) Temp Source: Oral (01/12 0542) BP: 140/90 mmHg (01/12 0832) Pulse Rate: 95 (01/12 0832)  Labs:  Recent Labs  08/07/14 0315 08/07/14 1756 08/08/14 0228 08/09/14 0640  HGB 12.4  --  12.5 13.5  HCT 39.0  --  39.4 43.0  PLT 226  --  227 262  LABPROT  --   --   --  15.0  INR  --   --   --  1.16  HEPARINUNFRC 0.73* 0.55 0.65 0.25*  CREATININE 1.08  --   --  0.93    Estimated Creatinine Clearance: 36.8 mL/min (by C-G formula based on Cr of 0.93).  Assessment: 81 yr old female with extensive PE.  Heparin levels had been therapeutic on 900 units/hr, but just below goal this am at 0.25.  RN reports that IV was leaking ~6:20 am, so heparin was stopped.  Drip resumed after new IV site obtained ~7:30am, but level was drawn at 6:40am, when drip was off.  Coumadin initiated with 4 mg on 08/08/13. INR 1.16 today. Previously on Coumadin for atrial fibrillation: 2 mg MWF and 4 mg on TTSS.  Coumadin stopped in December 2015 due to fall and continued fall risk.  Hx breast cancer with mets, and on Letrozole, both of which can contribute to clotting risk.    Goal of Therapy:  INR 2-3 Heparin level 0.3-0.7 units/ml Monitor platelets by anticoagulation protocol: Yes   Plan:    Continue heparin drip at 900 units/hr.   Will recheck heparin level ~ 8 hrs after drip resumed.   Repeat Coumadin 4 mg today.   Daily heparin level, PT/INR and CBC.  Arty Baumgartner, [place] Pager: [phone] 08/09/2014,11:55 AM  Addendum:  Heparin level this afternoon is 0.3 on 900 units/hr.  Low therapeutic, but will increase to 1000 units/hr to try to keep level in goal range.  Next labs  in am.  Consuello Masse, RPh. 5:05 PM 08/09/2014

## 2014-08-09 NOTE — Evaluation (Signed)
Physical Therapy Evaluation Patient Details Name: Leah Guerra MRN: [id number] DOB: 05/14/1925 Today's Date: 08/09/2014   History of Present Illness  81 y.o. female  presented to the emergency room as a transfer from her skilled nursing facility having complaints of shortness of breath. She was noted by staff members to have nonexertional dyspnea. Initial lab work performed in the emergency department revealed an elevated d-dimer of 17.41.  CTA of the chest subsequently revealed extensive bilateral pulmonary emboli with CT evidence of right heart strain  Clinical Impression  Pt admitted with the above complications. Pt currently with functional limitations due to the deficits listed below (see PT Problem List). Requires frequent re-direction and cues due to poor cognition. Ambulates up to 15 feet with frequent min assist, and is a high fall risk due to loss of balance towards posterior. SpO2 down to 84-86% on 2L supplemental O2 after ambulation, improved to 94% with 3L. Will require SNF for safety. Pt will benefit from skilled PT to increase their independence and safety with mobility to allow discharge to the venue listed below.       Follow Up Recommendations SNF;Supervision/Assistance - 24 hour    Equipment Recommendations  None recommended by PT    Recommendations for Other Services       Precautions / Restrictions Precautions Precautions: Fall Restrictions Weight Bearing Restrictions: No      Mobility  Bed Mobility Overal bed mobility: Needs Assistance Bed Mobility: Sit to Supine     Supine to sit: Min assist Sit to supine: Min assist   General bed mobility comments: min assist for LE support into bed. Frequent cues for technique  Transfers Overall transfer level: Needs assistance Equipment used: Rolling walker (2 wheeled) Transfers: Sit to/from [facility] Sit to Stand: Min assist Stand pivot transfers: Min assist       General transfer  comment: Min assist for boost to stand with tactile cue to facilitate movement. Performed from recliner, chair without arm rests, and BSC. Leans posteriorly and needs min assist for balance and with transfer to [facility] with assist for walker placement.  Ambulation/Gait Ambulation/Gait assistance: Min assist Ambulation Distance (Feet): 15 Feet Assistive device: Rolling walker (2 wheeled) Gait Pattern/deviations: Step-through pattern;Decreased stride length;Shuffle;Trunk flexed;Drifts right/left;Leaning posteriorly Gait velocity: Decreased   General Gait Details: Max verbal cues for technique. Min assist at all times for walker placement and balance for loss towards posterior. Educated on safe DME use with RW. Needs frequent redirection due to easily distracted.  Stairs            Wheelchair Mobility    Modified Rankin (Stroke Patients Only)       Balance Overall balance assessment: Needs assistance Sitting-balance support: No upper extremity supported;Feet supported Sitting balance-[name]: Fair     Standing balance support: Bilateral upper extremity supported Standing balance-[name]: Poor                               Pertinent Vitals/Pain Pain Assessment: No/denies pain Pain Intervention(s): Monitored during session  SpO2 92% on 1.5L at rest SpO2 84-86% on 2L ambulating SpO2 94% on 3L, tapered back to 2L with pursed lip breathing technique at 94% end of therapy.  BP 141/87 HR 79    Home Living Family/patient expects to be discharged to:: Skilled nursing facility Living Arrangements:  (SNF) Available Help at Discharge: [place] Type of Home: House Home Access: Stairs to enter  Entrance Stairs-Number of Steps: 3   Home Equipment: Walker - 2 wheels      Prior Function Level of Independence: Needs assistance   Gait / Transfers Assistance Needed: States she uses RW at baseline  ADL's / Homemaking Assistance Needed: Lives at  SNF  Comments: Pt poor historian, some information provided from previous notes.     Hand Dominance        Extremity/Trunk Assessment   Upper Extremity Assessment: Defer to OT evaluation           Lower Extremity Assessment: Generalized weakness;Difficult to assess due to impaired cognition         Communication   Communication: HOH  Cognition Arousal/Alertness: Awake/alert Behavior During Therapy: Agitated Overall Cognitive Status: History of cognitive impairments - at baseline Area of Impairment: Orientation;Memory;Following commands;Safety/judgement;Problem solving;Attention Orientation Level: Disoriented to;Place;Time;Situation Current Attention Level: Selective Memory: Decreased short-term memory Following Commands: Follows one step commands inconsistently Safety/Judgement: Decreased awareness of safety;Decreased awareness of deficits   Problem Solving: Slow processing;Decreased initiation;Difficulty sequencing;Requires verbal cues;Requires tactile cues General Comments: Requires repeated cues, became increasingly agitated.    General Comments General comments (skin integrity, edema, etc.): Needs frequent cues and redirection. Became increasingly agitated during therapy session but non-combative. SpO2 dropped to 84-86% on 2L supplemental O2, improved to 94% with 3L, and tapered to 2L at end of session with pt in low-mid 90s.    Exercises        Assessment/Plan    PT Assessment Patient needs continued PT services  PT Diagnosis Difficulty walking;Abnormality of gait;Generalized weakness   PT Problem List Decreased strength;Decreased activity tolerance;Decreased balance;Decreased mobility;Decreased cognition;Decreased knowledge of use of DME;Decreased safety awareness;Decreased knowledge of precautions;Cardiopulmonary status limiting activity  PT Treatment Interventions DME instruction;Gait training;Functional mobility training;Therapeutic activities;Therapeutic  exercise;Balance training;Neuromuscular re-education;Cognitive remediation;Patient/family education   PT Goals (Current goals can be found in the Care Plan section) Acute Rehab PT Goals Patient Stated Goal: Go outside PT Goal Formulation: With patient Time For Goal Achievement: 08/23/14 Potential to Achieve Goals: Fair    Frequency Min 2X/week   Barriers to discharge        Co-evaluation               End of Session Equipment Utilized During Treatment: Gait belt;Oxygen Activity Tolerance: Patient limited by fatigue;Treatment limited secondary to agitation Patient left: in bed;with call bell/phone within reach;with bed alarm set;with family/visitor present Nurse Communication: Mobility status         Time: 1131-1206 PT Time Calculation (min) (ACUTE ONLY): 35 min   Charges:   PT Evaluation $Initial PT Evaluation Tier I: 1 Procedure PT Treatments $Therapeutic Activity: 23-37 mins   PT G CodesEllouise Newer 08/09/2014, 1:08 PM  [facility], [place]

## 2014-08-09 NOTE — Evaluation (Signed)
Occupational Therapy Evaluation Patient Details Name: Leah Guerra MRN: [id number] DOB: 07/05/1925 Today's Date: 08/09/2014    History of Present Illness 81 y.o. female  presented to the emergency room as a transfer from her skilled nursing facility having complaints of shortness of breath. She was noted by staff members to have nonexertional dyspnea. Initial lab work performed in the emergency department revealed an elevated d-dimer of 17.41.  CTA of the chest subsequently revealed extensive bilateral pulmonary emboli with CT evidence of right heart strain   Clinical Impression   Pt admitted for the above diagnosis and has the deficits listed below. Pt would benefit from cont OT to reach her previous baseline status at [facility] when she was ready to dc to ALF.  Pt would do very well at an ALF and after a short "tune up" at SNF,she should be successful there.    Follow Up Recommendations  SNF;Supervision/Assistance - 24 hour    Equipment Recommendations  None recommended by OT    Recommendations for Other Services       Precautions / Restrictions Precautions Precautions: Fall Restrictions Weight Bearing Restrictions: No      Mobility Bed Mobility Overal bed mobility: Needs Assistance Bed Mobility: Sit to Supine;Supine to Sit     Supine to sit: Min assist Sit to supine: Min assist   General bed mobility comments: min assist for LE support into bed. Frequent cues for technique  Transfers Overall transfer level: Needs assistance Equipment used: Rolling walker (2 wheeled) Transfers: Sit to/from [facility] Sit to Stand: Min assist Stand pivot transfers: Min assist       General transfer comment: Min assist for boost to stand with tactile cue to facilitate movement. Performed from recliner, chair without arm rests, and BSC. Leans posteriorly and needs min assist for balance and with transfer to [facility] with assist for walker placement.    Balance  Overall balance assessment: Needs assistance Sitting-balance support: Feet supported Sitting balance-[name]: Fair     Standing balance support: Bilateral upper extremity supported;During functional activity Standing balance-[name]: Poor Standing balance comment: pt with posterior lean                            ADL Overall ADL's : Needs assistance/impaired Eating/Feeding: Set up;Cueing for safety;Cueing for sequencing;Sitting Eating/Feeding Details (indicate cue type and reason): cuing for initiation Grooming: Wash/dry hands;Wash/dry face;Minimal assistance;Sitting Grooming Details (indicate cue type and reason): cues to recall what she had and had not done. Upper Body Bathing: Minimal assitance;Sitting Upper Body Bathing Details (indicate cue type and reason): assist to be thorough Lower Body Bathing: Maximal assistance;Sit to/from stand Lower Body Bathing Details (indicate cue type and reason): SNF has been bathing pts LE for some timenow. Upper Body Dressing : Moderate assistance   Lower Body Dressing: Total assistance   Toilet Transfer: Minimal assistance Toilet Transfer Details (indicate cue type and reason): pt with posterior lean Toileting- Clothing Manipulation and Hygiene: Total assistance Toileting - Clothing Manipulation Details (indicate cue type and reason): SNF has been doing this for pt.     Functional mobility during ADLs: Moderate assistance General ADL Comments: Pt. requires extensive cues     Vision                     Perception     Praxis      Pertinent Vitals/Pain Pain Assessment: No/denies pain Pain Intervention(s): Monitored during session  Hand Dominance Right   Extremity/Trunk Assessment Upper Extremity Assessment Upper Extremity Assessment: Generalized weakness   Lower Extremity Assessment Lower Extremity Assessment: Defer to PT evaluation   Cervical / Trunk Assessment Cervical / Trunk Assessment:  Kyphotic   Communication Communication Communication: HOH   Cognition Arousal/Alertness: Awake/alert Behavior During Therapy: WFL for tasks assessed/performed Overall Cognitive Status: History of cognitive impairments - at baseline Area of Impairment: Orientation;Memory;Following commands;Safety/judgement;Awareness;Problem solving Orientation Level: Disoriented to;Place;Time;Situation Current Attention Level: Selective Memory: Decreased short-term memory Following Commands: Follows one step commands inconsistently Safety/Judgement: Decreased awareness of safety;Decreased awareness of deficits Awareness: Intellectual Problem Solving: Slow processing;Decreased initiation;Difficulty sequencing;Requires verbal cues;Requires tactile cues General Comments: Requires lots of cues.  Much of what she said was not accurate but daughter did assist in correcting her.  Pt did become very SOB with any activity which made pt very uncomforable.   General Comments       Exercises       Shoulder Instructions      Home Living Family/patient expects to be discharged to:: Skilled nursing facility Living Arrangements: Non-relatives/Friends Available Help at Discharge: [place] Type of Home: House Home Access: Stairs to enter Entrance Stairs-Number of Steps: 3                   Home Equipment: Walker - 2 wheels   Additional Comments: Pt was supposed to go to ALF today from [facility] before being admitted for SOB.  Now,pt in acute care and will need [place] to get back to baseline to be able to go to ALF.      Prior Functioning/Environment Level of Independence: Needs assistance  Gait / Transfers Assistance Needed: Pt uses RW at baseline and must have someone walking with her b/c she is a fall risk. ADL's / Homemaking Assistance Needed: Pt had assist at SNF with these tasks and will continue to have assist with these tasks when she goes to ALF.   Comments: Pt poor  historian, some information provided from previous notes.    OT Diagnosis: Generalized weakness;Cognitive deficits   OT Problem List: Decreased strength;Decreased activity tolerance;Decreased cognition;Decreased safety awareness;Decreased knowledge of use of DME or AE   OT Treatment/Interventions: Self-care/ADL training;Therapeutic exercise;Neuromuscular education;DME and/or AE instruction    OT Goals(Current goals can be found in the care plan section) Acute Rehab OT Goals Patient Stated Goal: to be able to breathe better OT Goal Formulation: Patient unable to participate in goal setting Time For Goal Achievement: 08/23/14 Potential to Achieve Goals: Fair ADL Goals Pt Will Perform Eating: with set-up;sitting Pt Will Perform Grooming: with set-up;sitting (w min cues for redirection) Pt Will Transfer to Toilet: with min guard assist;ambulating;bedside commode Pt Will Perform Tub/Shower Transfer: Shower transfer;shower seat;ambulating;rolling walker;with min guard assist  OT Frequency: Min 2X/week   Barriers to D/C: Decreased caregiver support  pt to go to new ALF after leaving [place]       Co-evaluation              End of Session Equipment Utilized During Treatment: Rolling walker;Oxygen Nurse Communication: Mobility status  Activity Tolerance: Patient tolerated treatment well Patient left: in bed;with call bell/phone within reach;with bed alarm set;with family/visitor present   Time: 7341-9379 OT Time Calculation (min): 30 min Charges:  OT General Charges $OT Visit: 1 Procedure OT Evaluation $Initial OT Evaluation Tier I: 1 Procedure OT Treatments $Self Care/Home Management : 23-37 mins G-Codes:    Glenford Peers 2014/08/25, 1:27 PM [phone]

## 2014-08-09 NOTE — Progress Notes (Signed)
Pt had 5 beat run of V tach. Baltazar Najjar, NP paged. Will continue to monitor pt.

## 2014-08-10 ENCOUNTER — Encounter (HOSPITAL_COMMUNITY): Payer: Self-pay | Admitting: General Practice

## 2014-08-10 DIAGNOSIS — J9600 Acute respiratory failure, unspecified whether with hypoxia or hypercapnia: Secondary | ICD-10-CM

## 2014-08-10 DIAGNOSIS — I482 Chronic atrial fibrillation: Secondary | ICD-10-CM

## 2014-08-10 LAB — CBC
HCT: 36.8 % (ref 36.0–46.0)
Hemoglobin: 11.7 g/dL — ABNORMAL LOW (ref 12.0–15.0)
MCH: 29.2 pg (ref 26.0–34.0)
MCHC: 31.8 g/dL (ref 30.0–36.0)
MCV: 91.8 fL (ref 78.0–100.0)
PLATELETS: 255 10*3/uL (ref 150–400)
RBC: 4.01 MIL/uL (ref 3.87–5.11)
RDW: 14.6 % (ref 11.5–15.5)
WBC: 6.1 10*3/uL (ref 4.0–10.5)

## 2014-08-10 LAB — PROTIME-INR
INR: 1.25 (ref 0.00–1.49)
Prothrombin Time: 15.8 seconds — ABNORMAL HIGH (ref 11.6–15.2)

## 2014-08-10 LAB — HEPARIN LEVEL (UNFRACTIONATED): HEPARIN UNFRACTIONATED: 0.5 [IU]/mL (ref 0.30–0.70)

## 2014-08-10 MED ORDER — ENOXAPARIN SODIUM 60 MG/0.6ML ~~LOC~~ SOLN
60.0000 mg | Freq: Two times a day (BID) | SUBCUTANEOUS | Status: DC
  Administered 2014-08-10: 60 mg via SUBCUTANEOUS
  Filled 2014-08-10 (×2): qty 0.6

## 2014-08-10 MED ORDER — ENOXAPARIN SODIUM 60 MG/0.6ML ~~LOC~~ SOLN: 60.0000 mg | Freq: Two times a day (BID) | SUBCUTANEOUS | Status: AC

## 2014-08-10 MED ORDER — WARFARIN SODIUM 4 MG PO TABS
4.0000 mg | ORAL_TABLET | Freq: Once | ORAL | Status: DC
  Filled 2014-08-10: qty 1

## 2014-08-10 MED ORDER — QUETIAPINE FUMARATE 25 MG PO TABS: 12.5000 mg | ORAL_TABLET | Freq: Every day | ORAL | Status: AC

## 2014-08-10 MED ORDER — CITALOPRAM HYDROBROMIDE 10 MG PO TABS: 10.0000 mg | ORAL_TABLET | Freq: Every day | ORAL | Status: AC

## 2014-08-10 MED ORDER — WARFARIN SODIUM 4 MG PO TABS: 6.0000 mg | ORAL_TABLET | Freq: Every day | ORAL | Status: AC

## 2014-08-10 NOTE — Clinical Social Work Note (Signed)
Per MD patient ready to DC back to [facility]. RN, patient/family (daughter [name]), and facility notified of patient's DC. RN given number for report. DC packet on patient's chart. Ambulance transport requested for patient for 1:30pm. [employer] Medicare auth received [phone], next review date: 1/15, RUG level - RVB. CSW signing off at this time.   Liz Beach MSW, [place], [place], [phone]

## 2014-08-10 NOTE — Progress Notes (Signed)
ANTICOAGULATION CONSULT NOTE - Follow Up Consult  Pharmacy Consult for Heparin -> Lovenox and Coumadin Indication: pulmonary embolus  No Known Allergies  Patient Measurements: Height: 5\' 4"  (162.6 cm) Weight: 136 lb 14.5 oz (62.1 kg) IBW/kg (Calculated) : 54.7  Dosing weight: 62.1 kg  Vital Signs: Temp: 97.4 F (36.3 C) (01/13 0555) Temp Source: Oral (01/13 0555) BP: 127/83 mmHg (01/13 1001) Pulse Rate: 80 (01/13 1001)  Labs:  Recent Labs  08/08/14 0228 08/09/14 0640 08/09/14 1603 08/10/14 0458  HGB 12.5 13.5  --  11.7*  HCT 39.4 43.0  --  36.8  PLT 227 262  --  255  LABPROT  --  15.0  --  15.8*  INR  --  1.16  --  1.25  HEPARINUNFRC 0.65 0.25* 0.30 0.50  CREATININE  --  0.93  --   --     Estimated Creatinine Clearance: 36.8 mL/min (by C-G formula based on Cr of 0.93).  Assessment: 81 yr old female with extensive PE.  Heparin level therapeutic (0.50) today on 1000 units/hr. Transitioned to Lovenox this morning, may go to facility today.  Overlap day # 3 of 5 minimum with Coumadin. INR is 1.25 after Coumadin 4 mg daily x 2 days.   Previously on Coumadin for atrial fibrillation: 2 mg MWF and 4 mg on TTSS.  Coumadin stopped in December 2015 due to fall and continued fall risk.  Hx breast cancer with mets, and on Letrozole, both of which can contribute to clotting risk.    Goal of Therapy:  INR 2-3 Heparin level 0.3-0.7 units/ml Anti-Xa level 0.6-1 units/ml 4hrs after LMWH dose given Monitor platelets by anticoagulation protocol: Yes   Plan:    Heparin drip stopped at 9am.   Lovenox 60 mg sq q12hrs begun 1 hr later at 10am.   Repeat Coumadin 4 mg today.   Daily PT/INR and CBC while in the hospital.   Continue Lovenox for at least 3 days, and until INR > 2 x 24 hrs.   Would continue Coumadin 4 mg daily at discharge, then adjust as needed based on PT/INR.  Arty Baumgartner, [place] Pager: [phone] 08/10/2014,10:16 AM

## 2014-08-10 NOTE — Plan of Care (Signed)
Problem: Phase II Progression Outcomes Goal: O2 sats > equal to 90% on RA or at baseline Outcome: Adequate for Discharge Pt dep on [facility]   Problem: Discharge Progression Outcomes Goal: Able to self administer respiratory meds Outcome: Adequate for Discharge Pt going to SNF

## 2014-08-10 NOTE — Discharge Summary (Signed)
Physician Discharge Summary  Leah Guerra [facility] OQH:[id number] DOB: 1925/07/01 DOA: 08/06/2014  PCP: [name], MD  Admit date: 08/06/2014 Discharge date: 08/10/2014  Time spent: 50 minutes  Recommendations for Outpatient Follow-up:  1. Daily INR and adjustment of Coumadin dosing as needed based upon INR 2. Discontinue Lovenox once INR is therapeutic and check biweekly INR after this 3. Daily weights to ensure the Guerra does not develop significant heart failure  Discharge Condition: Stable Diet recommendation: Heart healthy  Discharge Diagnoses:  Principal Problem:   PE (pulmonary embolism) Active Problems:   Respiratory failure, acute   Chronic atrial fibrillation   Hypertension   Lung nodule with a history of breast cancer- followed by Dr [name]   Chronic diastolic CHF (congestive heart failure)   Pleural effusion   Breast cancer   History of present illness:  81 y.o. female with advanced dementia, chronic diastolic congestive heart failure, chronic atrial fibrillation previously on warfarin, recurrent falls and metastatic breast cancer who was discharged on 07/19/2014 after she was admitted for possible syncope after she was found by family members on the kitchen floor. During that hospitalization anticoagulation was discontinued given Guerra's high fall risk.   This admit she presented to the emergency room as a transfer from her skilled nursing facility having complaints of shortness of breath. She was noted by staff members to have nonexertional dyspnea. Initial lab work performed in the emergency department revealed an elevated d-dimer of 17.41. CTA of the chest subsequently revealed extensive bilateral pulmonary emboli with CT evidence of right heart strain.   Hospital Course:  Extensive B PE  - She was initially started on IV heparin and has been transitioned to subcutaneous Lovenox  -Guerra and her family have opted to start Coumadin for long-term  anticoagulation  -long term anticoag will be challenging as she will be high risk for bleeding complications-she does have a history of falls however at this time does have a dual indication for Coumadin and at least needs it temporarily until pulmonary emboli resolved  -Follow INR and discontinue Lovenox when INR therapeutic  Acute hypoxic resp failure Essentially resolved - sequelae of above  Wean oxygen as tolerated  Advanced dmenentia No agitation at present -She has been started on Celexa and Seroquel during this hospital admission  Afib w/ bradycardia  Rate currently stable -On low dose coreg. -Continue Lovenox/Coumadin as mentioned above  Chronic diastolic CHF Well compensated at this time  Continue Lasix and monitor daily weights to prevent over under diuresis  Hypothyroidism TSH normal - no change in tx   Breast CA metastatic to lung CT-guided biopsy of right side pleural nodule on 03/24/2014 confirmed metastatic breast cancer (ER positive PR positive HER-2 negative) - started Femara therapy 04/12/2014 - followed by Dr. [name]    Procedures:  None  Consultations:  None  Discharge Exam: Filed Weights   08/06/14 1001 08/06/14 1611 08/08/14 0425  Weight: 59.421 kg (131 lb) 61 kg (134 lb 7.7 oz) 62.1 kg (136 lb 14.5 oz)   Filed Vitals:   08/10/14 1001  BP: 127/83  Pulse: 80  Temp:   Resp:     General: AA but confused to place and situation, no distress Cardiovascular: RRR, no murmurs  Respiratory: clear to auscultation bilaterally GI: soft, non-tender, non-distended, bowel sound positive  Discharge Instructions You were cared for by a hospitalist during your hospital stay. If you have any questions about your discharge medications or the care you received while you were in the hospital  after you are discharged, you can call the unit and asked to speak with the hospitalist on call if the hospitalist that took care of you is not available. Once you are  discharged, your primary care physician will handle any further medical issues. Please note that NO REFILLS for any discharge medications will be authorized once you are discharged, as it is imperative that you return to your primary care physician (or establish a relationship with a primary care physician if you do not have one) for your aftercare needs so that they can reassess your need for medications and monitor your lab values.      Discharge Instructions    Diet - low sodium heart healthy    Complete by:  As directed      Increase activity slowly    Complete by:  As directed             Medication List    STOP taking these medications        zolpidem 5 MG tablet  Commonly known as:  AMBIEN      TAKE these medications        atorvastatin 40 MG tablet  Commonly known as:  LIPITOR  Take 1 tablet (40 mg total) by mouth daily.     carvedilol 3.125 MG tablet  Commonly known as:  COREG  Take 1 tablet (3.125 mg total) by mouth 2 (two) times daily with a meal.     citalopram 10 MG tablet  Commonly known as:  CELEXA  Take 1 tablet (10 mg total) by mouth daily.     enoxaparin 60 MG/0.6ML injection  Commonly known as:  LOVENOX  Inject 0.6 mLs (60 mg total) into the skin every 12 (twelve) hours.     furosemide 40 MG tablet  Commonly known as:  LASIX  Take 40 mg by mouth daily.     letrozole 2.5 MG tablet  Commonly known as:  FEMARA  Take 1 tablet (2.5 mg total) by mouth daily.     levothyroxine 125 MCG tablet  Commonly known as:  SYNTHROID, LEVOTHROID  Take 1 tablet (125 mcg total) by mouth daily before breakfast.     potassium chloride SA 20 MEQ tablet  Commonly known as:  K-DUR,KLOR-CON  Take 20 mEq by mouth daily.     QUEtiapine 25 MG tablet  Commonly known as:  SEROQUEL  Take 0.5 tablets (12.5 mg total) by mouth at bedtime.     warfarin 4 MG tablet  Commonly known as:  COUMADIN  Take 1.5 tablets (6 mg total) by mouth daily.       No Known  Allergies    The results of significant diagnostics from this hospitalization (including imaging, microbiology, ancillary and laboratory) are listed below for reference.    Significant Diagnostic Studies: Dg Chest 1 View  07/16/2014   CLINICAL DATA:  Fall, congestive heart failure  EXAM: CHEST - 1 VIEW  COMPARISON:  05/30/2014, CT 8 3,015, PET-CT 03/11/2014  FINDINGS: Stable enlarged cardiac silhouette. There are bilateral pleural effusions not changed. There is chronic interstitial lung disease. There is increase in linear opacities the left right lung. There is nodularity in the right lower lobe which is not changed. These correspond to nodule described on comparison CT of 02/28/2014 and PET-CT . Stable consolidation at the left lung apex.  IMPRESSION: 1. Interval increased interstitial edema. 2. Stable cardiomegaly and bilateral pleural effusions. 3. Stable nodularity in the right lower lobe and consolidation in the left  apex   Electronically Signed   By: Suzy Bouchard M.D.   On: 07/16/2014 14:44   Dg Chest 2 View  08/06/2014   CLINICAL DATA:  SOB but the Guerra denies any chest pains. Guerra has a hx of CHF, Pleural effusion, HTN, atrial fibrillation, breast cancer and lung nodule  EXAM: CHEST - 2 VIEW  COMPARISON:  07/16/2014  FINDINGS: Moderate left pleural effusion, increased since previous exam. Small right pleural effusion. Progressive consolidation/ atelectasis at the left lung base. Persistent left apical pleural parenchymal opacities. Surgical clips left axilla. Heart size difficult to assess due to adjacent opacity. Atheromatous aortic arch. 3.9 cm spiculated nodule in the right mid lung without significant change. Interstitial and patchy airspace opacities at the right lung base are stable.  IMPRESSION: 1. Worsening moderate left pleural effusion and left lower lung consolidation/atelectasis. 2. Little change in right mid lung nodule and right basilar patchy interstitial and airspace  opacities.   Electronically Signed   By: Arne Cleveland M.D.   On: 08/06/2014 10:58   Dg Pelvis 1-2 Views  07/16/2014   CLINICAL DATA:  Fall; Pain appears to be in hips and pelvis  EXAM: PELVIS - 1-2 VIEW  COMPARISON:  Radiograph 03/29/2014  FINDINGS: Left hip prosthetic noted. No evidence of fracture or dislocation of left right hip. No pelvic or sacral fracture.  IMPRESSION: No fracture or dislocation.  No pelvic fracture.   Electronically Signed   By: Suzy Bouchard M.D.   On: 07/16/2014 14:41   Ct Head Wo Contrast  07/16/2014   CLINICAL DATA:  Fall, history of breast cancer  EXAM: CT HEAD WITHOUT CONTRAST  CT CERVICAL SPINE WITHOUT CONTRAST  TECHNIQUE: Multidetector CT imaging of the head and cervical spine was performed following the standard protocol without intravenous contrast. Multiplanar CT image reconstructions of the cervical spine were also generated.  COMPARISON:  None.  FINDINGS: CT HEAD FINDINGS  No evidence of parenchymal hemorrhage or extra-axial fluid collection. No mass lesion, mass effect, or midline shift.  No CT evidence of acute infarction.  Subcortical white matter and periventricular small vessel ischemic changes. Mild intracranial atherosclerosis.  Global cortical atrophy with secondary ventricular prominence.  The visualized paranasal sinuses are essentially clear. The mastoid air cells are unopacified.  No evidence of calvarial fracture.  CT CERVICAL SPINE FINDINGS  Normal cervical lordosis.  No evidence of fracture or dislocation. Vertebral body heights are maintained. Dens appears intact.  No prevertebral soft tissue swelling.  Moderate degenerative changes of the mid cervical spine.  Visualized thyroid is unremarkable.  Stable radiation changes versus pleural parenchymal scarring in the left lung apex. Right apical pleural parenchymal scarring.  IMPRESSION: No evidence of acute intracranial abnormality. Atrophy with small vessel ischemic changes and intracranial  atherosclerosis.  No evidence of traumatic injury to the cervical spine. Moderate multilevel degenerative changes.   Electronically Signed   By: Julian Hy M.D.   On: 07/16/2014 14:15   Ct Angio Chest Pe W/cm &/or Wo Cm  08/06/2014   CLINICAL DATA:  SOB EPISODE THIS AM WHILE EATING BREAKFAST, HX PLEURAL EFFUSION, HTN, CHF, AFIB, BREAST CA, LUNG NODULE  EXAM: CT ANGIOGRAPHY CHEST WITH CONTRAST  TECHNIQUE: Multidetector CT imaging of the chest was performed using the standard protocol during bolus administration of intravenous contrast. Multiplanar CT image reconstructions and MIPs were obtained to evaluate the vascular anatomy.  CONTRAST:  50m OMNIPAQUE IOHEXOL 350 MG/ML SOLN  COMPARISON:  03/21/2014  FINDINGS: Left arm injection. SVC is patent. There  is right atrial enlargement with reflux of contrast into hepatic veins. Right ventricle is dilated compared to the left. Dilated central pulmonary arteries. There is good contrast opacification of pulmonary artery branches. Multiple Segmental filling defects noted in both right and left lower lobe pulmonary artery branches, worse left than right. There is a central more occlusive emboli in the right upper lobe pulmonary artery branch. Incomplete opacification of pulmonary veins. Left atrial enlargement. Scattered coronary calcifications. Poor opacification of the thoracic aorta, with scattered atheromatous plaque in the arch and descending segment, no aneurysm.  Moderately large left and small right pleural effusions. There is fluid in the left major fissure. Sub cm anterior mediastinal, pretracheal and precarinal lymph nodes. Coarse airspace consolidation with air bronchograms in the apices, left greater than right. Spiculated 15 x 16 mm nodule in the right lower lobe slightly less dense than on prior study of 02/28/2014. Many of the peripheral nodular opacities seen in the right middle and lower lobes also less conspicuous. There is extensive  atelectasis/consolidation throughout much of the left lower lobe. Minimal spondylitic changes in the mid thoracic spine. Sternum intact. Surgical clips left axilla.  Review of the MIP images confirms the above findings.  IMPRESSION: 1. Extensive bilateral pulmonary emboli with CT evidence of right heartstrain (RV/LV Ratio greater than 1) consistent with at least submassive (intermediate risk) PE. The presence of right heart strain has been associated with anincreased risk of morbidity and mortality. Consultation with Pulmonary and Critical Care Medicine is recommended. Critical Value/emergent results were called by telephone at the time of interpretation on 08/06/2014 at 1:52 pm to Dr. Dorie Rank , who verbally acknowledged these results. 2. Worsening bilateral pleural effusions left greater than right. 3. Some partial improvement in the nodular opacities in the right middle and lower lobes.   Electronically Signed   By: Arne Cleveland M.D.   On: 08/06/2014 13:53   Ct Cervical Spine Wo Contrast  07/16/2014   CLINICAL DATA:  Fall, history of breast cancer  EXAM: CT HEAD WITHOUT CONTRAST  CT CERVICAL SPINE WITHOUT CONTRAST  TECHNIQUE: Multidetector CT imaging of the head and cervical spine was performed following the standard protocol without intravenous contrast. Multiplanar CT image reconstructions of the cervical spine were also generated.  COMPARISON:  None.  FINDINGS: CT HEAD FINDINGS  No evidence of parenchymal hemorrhage or extra-axial fluid collection. No mass lesion, mass effect, or midline shift.  No CT evidence of acute infarction.  Subcortical white matter and periventricular small vessel ischemic changes. Mild intracranial atherosclerosis.  Global cortical atrophy with secondary ventricular prominence.  The visualized paranasal sinuses are essentially clear. The mastoid air cells are unopacified.  No evidence of calvarial fracture.  CT CERVICAL SPINE FINDINGS  Normal cervical lordosis.  No evidence of  fracture or dislocation. Vertebral body heights are maintained. Dens appears intact.  No prevertebral soft tissue swelling.  Moderate degenerative changes of the mid cervical spine.  Visualized thyroid is unremarkable.  Stable radiation changes versus pleural parenchymal scarring in the left lung apex. Right apical pleural parenchymal scarring.  IMPRESSION: No evidence of acute intracranial abnormality. Atrophy with small vessel ischemic changes and intracranial atherosclerosis.  No evidence of traumatic injury to the cervical spine. Moderate multilevel degenerative changes.   Electronically Signed   By: Julian Hy M.D.   On: 07/16/2014 14:15    Microbiology: Recent Results (from the past 240 hour(s))  MRSA PCR Screening     Status: None   Collection Time: 08/06/14  4:22  PM  Result Value Ref Range Status   MRSA by PCR NEGATIVE NEGATIVE Final    Comment:        The GeneXpert MRSA Assay (FDA approved for NASAL specimens only), is one component of a comprehensive MRSA colonization surveillance program. It is not intended to diagnose MRSA infection nor to guide or monitor treatment for MRSA infections.      Labs: Basic Metabolic Panel:  Recent Labs Lab 08/06/14 0956 08/07/14 0315 08/09/14 0640 08/09/14 1352  NA 139 141 136  --   K 4.5 4.1 3.8  --   CL 103 105 106  --   CO2 27 27 26   --   GLUCOSE 111* 117* 83  --   BUN 9 13 14   --   CREATININE 0.93 1.08 0.93  --   CALCIUM 9.3 9.0 8.5  --   MG  --   --   --  2.1   Liver Function Tests:  Recent Labs Lab 08/09/14 0640  AST 19  ALT 15  ALKPHOS 106  BILITOT 0.8  PROT 7.5  ALBUMIN 2.9*   No results for input(s): LIPASE, AMYLASE in the last 168 hours. No results for input(s): AMMONIA in the last 168 hours. CBC:  Recent Labs Lab 08/06/14 0956 08/07/14 0315 08/08/14 0228 08/09/14 0640 08/10/14 0458  WBC 7.2 6.8 7.5 7.3 6.1  HGB 12.8 12.4 12.5 13.5 11.7*  HCT 40.5 39.0 39.4 43.0 36.8  MCV 91.4 91.3 93.1 94.1  91.8  PLT 253 226 227 262 255   Cardiac Enzymes: No results for input(s): CKTOTAL, CKMB, CKMBINDEX, TROPONINI in the last 168 hours. BNP: BNP (last 3 results)  Recent Labs  03/28/14 0840 07/17/14 0411  PROBNP 3172.0* 2168.0*   CBG: No results for input(s): GLUCAP in the last 168 hours.     SignedDebbe Odea, MD [facility] 08/10/2014, 11:17 AM

## 2014-08-10 NOTE — Discharge Summary (Signed)
Leah Guerra to be D/C'd Skilled nursing facility per MD order.  Discussed with the patient and all questions fully answered.    Medication List    STOP taking these medications        zolpidem 5 MG tablet  Commonly known as:  AMBIEN      TAKE these medications        atorvastatin 40 MG tablet  Commonly known as:  LIPITOR  Take 1 tablet (40 mg total) by mouth daily.     carvedilol 3.125 MG tablet  Commonly known as:  COREG  Take 1 tablet (3.125 mg total) by mouth 2 (two) times daily with a meal.     citalopram 10 MG tablet  Commonly known as:  CELEXA  Take 1 tablet (10 mg total) by mouth daily.     enoxaparin 60 MG/0.6ML injection  Commonly known as:  LOVENOX  Inject 0.6 mLs (60 mg total) into the skin every 12 (twelve) hours.     furosemide 40 MG tablet  Commonly known as:  LASIX  Take 40 mg by mouth daily.     letrozole 2.5 MG tablet  Commonly known as:  FEMARA  Take 1 tablet (2.5 mg total) by mouth daily.     levothyroxine 125 MCG tablet  Commonly known as:  SYNTHROID, LEVOTHROID  Take 1 tablet (125 mcg total) by mouth daily before breakfast.     potassium chloride SA 20 MEQ tablet  Commonly known as:  K-DUR,KLOR-CON  Take 20 mEq by mouth daily.     QUEtiapine 25 MG tablet  Commonly known as:  SEROQUEL  Take 0.5 tablets (12.5 mg total) by mouth at bedtime.     warfarin 4 MG tablet  Commonly known as:  COUMADIN  Take 1.5 tablets (6 mg total) by mouth daily.       Yellow packet given to EMS.    Patient escorted via stretcher, and D/C SNF via EMS.  Audria Nine F 08/10/2014 2:00 PM

## 2014-08-10 NOTE — Progress Notes (Signed)
Report called to Gildardo Pounds LPN at [facility].

## 2014-08-11 ENCOUNTER — Non-Acute Institutional Stay (SKILLED_NURSING_FACILITY): Payer: Medicare Other | Admitting: Adult Health

## 2014-08-11 ENCOUNTER — Encounter: Payer: Self-pay | Admitting: Adult Health

## 2014-08-11 DIAGNOSIS — C50919 Malignant neoplasm of unspecified site of unspecified female breast: Secondary | ICD-10-CM

## 2014-08-11 DIAGNOSIS — I5032 Chronic diastolic (congestive) heart failure: Secondary | ICD-10-CM

## 2014-08-11 DIAGNOSIS — F0390 Unspecified dementia without behavioral disturbance: Secondary | ICD-10-CM

## 2014-08-11 DIAGNOSIS — F329 Major depressive disorder, single episode, unspecified: Secondary | ICD-10-CM

## 2014-08-11 DIAGNOSIS — R5381 Other malaise: Secondary | ICD-10-CM

## 2014-08-11 DIAGNOSIS — E43 Unspecified severe protein-calorie malnutrition: Secondary | ICD-10-CM

## 2014-08-11 DIAGNOSIS — I482 Chronic atrial fibrillation, unspecified: Secondary | ICD-10-CM

## 2014-08-11 DIAGNOSIS — E039 Hypothyroidism, unspecified: Secondary | ICD-10-CM

## 2014-08-11 DIAGNOSIS — E876 Hypokalemia: Secondary | ICD-10-CM

## 2014-08-11 DIAGNOSIS — E785 Hyperlipidemia, unspecified: Secondary | ICD-10-CM

## 2014-08-11 DIAGNOSIS — I2699 Other pulmonary embolism without acute cor pulmonale: Secondary | ICD-10-CM

## 2014-08-11 DIAGNOSIS — F32A Depression, unspecified: Secondary | ICD-10-CM

## 2014-08-11 NOTE — Progress Notes (Signed)
Patient ID: Leah Guerra, female   DOB: [birth year], 81 y.o.   MRN: [id number]    08/11/2014  Facility:  [facility]ursing Home Location:  [place] Room Number: [phone] LEVEL OF CARE:  SNF (31)   Chief Complaint  Patient presents with  . Hospitalization Follow-up    Physical deconditioning, bilateral pulmonary emboli, chronic atrial fibrillation, chronic diastolic heart failure, hypothyroidism, hypokalemia, breast cancer, hyperlipidemia, depression and dementia    HISTORY OF PRESENT ILLNESS:  This is an 81 year old who was for discharge home but had shortness of breath at rest. He has PMH of chronic atrial fibrillation, chronic diastolic heart failure, breast cancer and hyperlipidemia. She was sent to the hospital and was diagnosed with bilateral pulmonary emboli. Coumadin was recently discontinued due to falls. She has been readmitted to [facility] on 08/10/14 from [facility]. She was readmitted for a short-term rehabilitation.  PAST MEDICAL HISTORY:  Past Medical History  Diagnosis Date  . Pleural effusion   . Hypertension   . CHF (congestive heart failure)   . Atrial fibrillation   . Hypothyroid   . Breast cancer   . Lung nodule   . Hyperlipidemia     CURRENT MEDICATIONS: Reviewed per MAR/see medication list  No Known Allergies   REVIEW OF SYSTEMS: unobtainable; patient is not a good historian due to dementia  PHYSICAL EXAMINATION  GENERAL: no acute distress, normal body habitus EYES: conjunctivae normal, sclerae normal, normal eye lids NECK: supple, trachea midline, no neck masses, no thyroid tenderness, no thyromegaly LYMPHATICS: no LAN in the neck, no supraclavicular LAN RESPIRATORY: breathing is even & unlabored, BS CTAB CARDIAC: Irregularly irregular, no murmur,no extra heart sounds, no edema GI: abdomen soft, normal BS, no masses, no tenderness, no hepatomegaly, no splenomegaly EXTREMITIES: Able to move all 4  extremities PSYCHIATRIC: the patient is alert & oriented to person, affect & behavior appropriate  LABS/RADIOLOGY: Labs reviewed: Basic Metabolic Panel:  Recent Labs  03/30/14 1133  07/17/14 0411  08/06/14 0956 08/07/14 0315 08/09/14 0640 08/09/14 1352  NA  --   < > 140  < > 139 141 136  --   K  --   < > 3.4*  < > 4.5 4.1 3.8  --   CL  --   < > 100  < > 103 105 106  --   CO2  --   < > 29  < > 27 27 26   --   GLUCOSE  --   < > 97  < > 111* 117* 83  --   BUN  --   < > 11  < > 9 13 14   --   CREATININE  --   < > 0.91  < > 0.93 1.08 0.93  --   CALCIUM  --   < > 8.9  < > 9.3 9.0 8.5  --   MG 2.0  --  2.0  --   --   --   --  2.1  < > = values in this interval not displayed. Liver Function Tests:  Recent Labs  03/29/14 0415 07/16/14 1219 08/09/14 0640  AST 36 21 19  ALT 21 9 15   ALKPHOS 105 114 106  BILITOT 0.6 0.8 0.8  PROT 7.0 8.1 7.5  ALBUMIN 2.6* 3.3* 2.9*    Recent Labs  03/28/14 0840  LIPASE 42   CBC:  Recent Labs  03/28/14 0840  07/16/14 1219  08/08/14 0228 08/09/14 0640 08/10/14 0458  WBC 12.2*  < > 6.1  < > 7.5 7.3 6.1  NEUTROABS 9.8*  --  4.2  --   --   --   --   HGB 13.3  < > 13.3  < > 12.5 13.5 11.7*  HCT 40.9  < > 42.2  < > 39.4 43.0 36.8  MCV 88.5  < > 91.3  < > 93.1 94.1 91.8  PLT 301  < > 196  < > 227 262 255  < > = values in this interval not displayed.  Cardiac Enzymes:  Recent Labs  03/28/14 0840 03/30/14 1133  TROPONINI <0.30 <0.30   CBG:  Recent Labs  03/11/14 1353 07/16/14 1158 07/19/14 0609  GLUCAP 96 71 97    Dg Chest 1 View  07/16/2014   CLINICAL DATA:  Fall, congestive heart failure  EXAM: CHEST - 1 VIEW  COMPARISON:  05/30/2014, CT 8 3,015, PET-CT 03/11/2014  FINDINGS: Stable enlarged cardiac silhouette. There are bilateral pleural effusions not changed. There is chronic interstitial lung disease. There is increase in linear opacities the left right lung. There is nodularity in the right lower lobe which is not  changed. These correspond to nodule described on comparison CT of 02/28/2014 and PET-CT . Stable consolidation at the left lung apex.  IMPRESSION: 1. Interval increased interstitial edema. 2. Stable cardiomegaly and bilateral pleural effusions. 3. Stable nodularity in the right lower lobe and consolidation in the left apex   Electronically Signed   By: Suzy Bouchard M.D.   On: 07/16/2014 14:44   Dg Chest 2 View  08/06/2014   CLINICAL DATA:  SOB but the patient denies any chest pains. Patient has a hx of CHF, Pleural effusion, HTN, atrial fibrillation, breast cancer and lung nodule  EXAM: CHEST - 2 VIEW  COMPARISON:  07/16/2014  FINDINGS: Moderate left pleural effusion, increased since previous exam. Small right pleural effusion. Progressive consolidation/ atelectasis at the left lung base. Persistent left apical pleural parenchymal opacities. Surgical clips left axilla. Heart size difficult to assess due to adjacent opacity. Atheromatous aortic arch. 3.9 cm spiculated nodule in the right mid lung without significant change. Interstitial and patchy airspace opacities at the right lung base are stable.  IMPRESSION: 1. Worsening moderate left pleural effusion and left lower lung consolidation/atelectasis. 2. Little change in right mid lung nodule and right basilar patchy interstitial and airspace opacities.   Electronically Signed   By: Arne Cleveland M.D.   On: 08/06/2014 10:58   Dg Pelvis 1-2 Views  07/16/2014   CLINICAL DATA:  Fall; Pain appears to be in hips and pelvis  EXAM: PELVIS - 1-2 VIEW  COMPARISON:  Radiograph 03/29/2014  FINDINGS: Left hip prosthetic noted. No evidence of fracture or dislocation of left right hip. No pelvic or sacral fracture.  IMPRESSION: No fracture or dislocation.  No pelvic fracture.   Electronically Signed   By: Suzy Bouchard M.D.   On: 07/16/2014 14:41   Ct Head Wo Contrast  07/16/2014   CLINICAL DATA:  Fall, history of breast cancer  EXAM: CT HEAD WITHOUT CONTRAST   CT CERVICAL SPINE WITHOUT CONTRAST  TECHNIQUE: Multidetector CT imaging of the head and cervical spine was performed following the standard protocol without intravenous contrast. Multiplanar CT image reconstructions of the cervical spine were also generated.  COMPARISON:  None.  FINDINGS: CT HEAD FINDINGS  No evidence of parenchymal hemorrhage or extra-axial fluid collection. No mass lesion, mass effect, or midline shift.  No CT evidence of acute  infarction.  Subcortical white matter and periventricular small vessel ischemic changes. Mild intracranial atherosclerosis.  Global cortical atrophy with secondary ventricular prominence.  The visualized paranasal sinuses are essentially clear. The mastoid air cells are unopacified.  No evidence of calvarial fracture.  CT CERVICAL SPINE FINDINGS  Normal cervical lordosis.  No evidence of fracture or dislocation. Vertebral body heights are maintained. Dens appears intact.  No prevertebral soft tissue swelling.  Moderate degenerative changes of the mid cervical spine.  Visualized thyroid is unremarkable.  Stable radiation changes versus pleural parenchymal scarring in the left lung apex. Right apical pleural parenchymal scarring.  IMPRESSION: No evidence of acute intracranial abnormality. Atrophy with small vessel ischemic changes and intracranial atherosclerosis.  No evidence of traumatic injury to the cervical spine. Moderate multilevel degenerative changes.   Electronically Signed   By: Julian Hy M.D.   On: 07/16/2014 14:15   Ct Angio Chest Pe W/cm &/or Wo Cm  08/06/2014   CLINICAL DATA:  SOB EPISODE THIS AM WHILE EATING BREAKFAST, HX PLEURAL EFFUSION, HTN, CHF, AFIB, BREAST CA, LUNG NODULE  EXAM: CT ANGIOGRAPHY CHEST WITH CONTRAST  TECHNIQUE: Multidetector CT imaging of the chest was performed using the standard protocol during bolus administration of intravenous contrast. Multiplanar CT image reconstructions and MIPs were obtained to evaluate the vascular  anatomy.  CONTRAST:  66mL OMNIPAQUE IOHEXOL 350 MG/ML SOLN  COMPARISON:  03/21/2014  FINDINGS: Left arm injection. SVC is patent. There is right atrial enlargement with reflux of contrast into hepatic veins. Right ventricle is dilated compared to the left. Dilated central pulmonary arteries. There is good contrast opacification of pulmonary artery branches. Multiple Segmental filling defects noted in both right and left lower lobe pulmonary artery branches, worse left than right. There is a central more occlusive emboli in the right upper lobe pulmonary artery branch. Incomplete opacification of pulmonary veins. Left atrial enlargement. Scattered coronary calcifications. Poor opacification of the thoracic aorta, with scattered atheromatous plaque in the arch and descending segment, no aneurysm.  Moderately large left and small right pleural effusions. There is fluid in the left major fissure. Sub cm anterior mediastinal, pretracheal and precarinal lymph nodes. Coarse airspace consolidation with air bronchograms in the apices, left greater than right. Spiculated 15 x 16 mm nodule in the right lower lobe slightly less dense than on prior study of 02/28/2014. Many of the peripheral nodular opacities seen in the right middle and lower lobes also less conspicuous. There is extensive atelectasis/consolidation throughout much of the left lower lobe. Minimal spondylitic changes in the mid thoracic spine. Sternum intact. Surgical clips left axilla.  Review of the MIP images confirms the above findings.  IMPRESSION: 1. Extensive bilateral pulmonary emboli with CT evidence of right heartstrain (RV/LV Ratio greater than 1) consistent with at least submassive (intermediate risk) PE. The presence of right heart strain has been associated with anincreased risk of morbidity and mortality. Consultation with Pulmonary and Critical Care Medicine is recommended. Critical Value/emergent results were called by telephone at the time of  interpretation on 08/06/2014 at 1:52 pm to Dr. Dorie Rank , who verbally acknowledged these results. 2. Worsening bilateral pleural effusions left greater than right. 3. Some partial improvement in the nodular opacities in the right middle and lower lobes.   Electronically Signed   By: Arne Cleveland M.D.   On: 08/06/2014 13:53   Ct Cervical Spine Wo Contrast  07/16/2014   CLINICAL DATA:  Fall, history of breast cancer  EXAM: CT HEAD WITHOUT CONTRAST  CT  CERVICAL SPINE WITHOUT CONTRAST  TECHNIQUE: Multidetector CT imaging of the head and cervical spine was performed following the standard protocol without intravenous contrast. Multiplanar CT image reconstructions of the cervical spine were also generated.  COMPARISON:  None.  FINDINGS: CT HEAD FINDINGS  No evidence of parenchymal hemorrhage or extra-axial fluid collection. No mass lesion, mass effect, or midline shift.  No CT evidence of acute infarction.  Subcortical white matter and periventricular small vessel ischemic changes. Mild intracranial atherosclerosis.  Global cortical atrophy with secondary ventricular prominence.  The visualized paranasal sinuses are essentially clear. The mastoid air cells are unopacified.  No evidence of calvarial fracture.  CT CERVICAL SPINE FINDINGS  Normal cervical lordosis.  No evidence of fracture or dislocation. Vertebral body heights are maintained. Dens appears intact.  No prevertebral soft tissue swelling.  Moderate degenerative changes of the mid cervical spine.  Visualized thyroid is unremarkable.  Stable radiation changes versus pleural parenchymal scarring in the left lung apex. Right apical pleural parenchymal scarring.  IMPRESSION: No evidence of acute intracranial abnormality. Atrophy with small vessel ischemic changes and intracranial atherosclerosis.  No evidence of traumatic injury to the cervical spine. Moderate multilevel degenerative changes.   Electronically Signed   By: Julian Hy M.D.   On:  07/16/2014 14:15    ASSESSMENT/PLAN:  Physical deconditioning - for rehabilitation Bilateral pulmonary emboli - continue Lovenox extremity milligrams subcutaneous twice a day there INR >2 and Coumadin Chronic atrial fibrillation - rate controlled; continue Coreg 3.125 mg by mouth twice a day Chronic diastolic heart failure - continue Lasix 40 mg by mouth daily Hypothyroidism - continue Synthroid 125 g by mouth daily Hypokalemia -  Continue K-Dur 20 meq daily Breast cancer - continue Femara 2.5 mg Hyperlipidemia -  Continue Lipitor 40 mg by mouth daily  Depression - continue Celexa 10 mg by mouth daily and Seroquel 12.5 mg by mouth daily at bedtime Dementia - stable Protein-Calorie malnutrition, severe - albumin 2.9; RD consultation  Goals of care:  Short-term rehabilitation   Labs/test ordered: none   Spent 50 minutes in patient care.     [facility], NP [employer] [phone]

## 2014-08-12 ENCOUNTER — Telehealth: Payer: Self-pay | Admitting: Oncology

## 2014-08-12 ENCOUNTER — Non-Acute Institutional Stay (SKILLED_NURSING_FACILITY): Payer: Medicare Other | Admitting: Internal Medicine

## 2014-08-12 DIAGNOSIS — F329 Major depressive disorder, single episode, unspecified: Secondary | ICD-10-CM

## 2014-08-12 DIAGNOSIS — F0391 Unspecified dementia with behavioral disturbance: Secondary | ICD-10-CM

## 2014-08-12 DIAGNOSIS — I482 Chronic atrial fibrillation, unspecified: Secondary | ICD-10-CM

## 2014-08-12 DIAGNOSIS — F0393 Unspecified dementia, unspecified severity, with mood disturbance: Secondary | ICD-10-CM

## 2014-08-12 DIAGNOSIS — C50919 Malignant neoplasm of unspecified site of unspecified female breast: Secondary | ICD-10-CM

## 2014-08-12 DIAGNOSIS — F0280 Dementia in other diseases classified elsewhere without behavioral disturbance: Secondary | ICD-10-CM

## 2014-08-12 DIAGNOSIS — E039 Hypothyroidism, unspecified: Secondary | ICD-10-CM

## 2014-08-12 DIAGNOSIS — J90 Pleural effusion, not elsewhere classified: Secondary | ICD-10-CM

## 2014-08-12 DIAGNOSIS — E46 Unspecified protein-calorie malnutrition: Secondary | ICD-10-CM

## 2014-08-12 DIAGNOSIS — F03918 Unspecified dementia, unspecified severity, with other behavioral disturbance: Secondary | ICD-10-CM

## 2014-08-12 DIAGNOSIS — E785 Hyperlipidemia, unspecified: Secondary | ICD-10-CM

## 2014-08-12 DIAGNOSIS — R5381 Other malaise: Secondary | ICD-10-CM

## 2014-08-12 DIAGNOSIS — I5032 Chronic diastolic (congestive) heart failure: Secondary | ICD-10-CM

## 2014-08-12 DIAGNOSIS — I2699 Other pulmonary embolism without acute cor pulmonale: Secondary | ICD-10-CM

## 2014-08-12 NOTE — Telephone Encounter (Signed)
R/S APPT FROM 1:30PM 1/21 TO 2/5 - S/W DTR SHE IS AWARE. DTR OK W/2/5.

## 2014-08-12 NOTE — Progress Notes (Signed)
Patient ID: Leah Guerra, female   DOB: Jun 09, 1925, 81 y.o.   MRN: [id number]      [facility]  PCP: [name], MD  Code Status: DNR  No Known Allergies  Chief Complaint  Patient presents with  . New Admit To SNF     HPI:  81 year old patient is here for short term rehabilitation post hospital admission from 08/06/14-08/10/14 with acute pulmonary embolism. she was started on lovenox and coumadin. She has history of dementia, congestive heart failure, hypertension, pleural effusions, breast cancer, lung nodule  and was undergoing STR post syncope and fall in the facility.  Of note, she was in the hospital from 07/16/14-07/19/14 with a syncope post fall and was taken off her coumadin.  She is seen in her room today, she is sitting on her wheelchair, pleasantly confused and in no distress. She denies any complaints. No concern from staff  Review of Systems:  Constitutional: Negative for fever, chills HENT: Negative for congestion Respiratory: Negative for cough, shortness of breath and wheezing.  on o2 Cardiovascular: Negative for chest pain, palpitations, leg swelling.  Gastrointestinal: Negative for heartburn, nausea, vomiting, abdominal pain Genitourinary: Negative for dysuria Neurological: Negative for headaches.  Psychiatric/Behavioral: has memory loss   Past Medical History  Diagnosis Date  . Pleural effusion   . Hypertension   . CHF (congestive heart failure)   . Atrial fibrillation   . Hypothyroid   . Breast cancer   . Lung nodule   . Hyperlipidemia    Past Surgical History  Procedure Laterality Date  . Joint replacement    . Masectomy Left   . Fracture surgery     Social History:   reports that she has never smoked. She has never used smokeless tobacco. She reports that she does not drink alcohol or use illicit drugs.  No family history on file.  Medications: Patient's Medications  New Prescriptions   No  medications on file  Previous Medications   ATORVASTATIN (LIPITOR) 40 MG TABLET    Take 1 tablet (40 mg total) by mouth daily.   CARVEDILOL (COREG) 3.125 MG TABLET    Take 1 tablet (3.125 mg total) by mouth 2 (two) times daily with a meal.   CITALOPRAM (CELEXA) 10 MG TABLET    Take 1 tablet (10 mg total) by mouth daily.   ENOXAPARIN (LOVENOX) 60 MG/0.6ML INJECTION    Inject 0.6 mLs (60 mg total) into the skin every 12 (twelve) hours.   FUROSEMIDE (LASIX) 40 MG TABLET    Take 40 mg by mouth daily.   LETROZOLE (FEMARA) 2.5 MG TABLET    Take 1 tablet (2.5 mg total) by mouth daily.   LEVOTHYROXINE (SYNTHROID, LEVOTHROID) 125 MCG TABLET    Take 1 tablet (125 mcg total) by mouth daily before breakfast.   POTASSIUM CHLORIDE SA (K-DUR,KLOR-CON) 20 MEQ TABLET    Take 20 mEq by mouth daily.   QUETIAPINE (SEROQUEL) 25 MG TABLET    Take 0.5 tablets (12.5 mg total) by mouth at bedtime.   WARFARIN (COUMADIN) 4 MG TABLET    Take 1.5 tablets (6 mg total) by mouth daily.  Modified Medications   No medications on file  Discontinued Medications   No medications on file     Physical Exam: Filed Vitals:   08/12/14 1701  BP: 123/74  Pulse: 81  Temp: 96.8 F (36 C)  Resp: 18  SpO2: 92%   General- elderly female in no acute distress,  thin built Head- atraumatic, normocephalic Eyes- PERRLA, EOMI, no pallor, no icterus, no discharge Neck- no cervical lymphadenopathy Throat- moist mucus membrane Cardiovascular- normal s1,s2, no murmurs Respiratory- bilateral clear to auscultation, no wheeze, no rhonchi, no crackles, no use of accessory muscles, on o2 by [place] Abdomen- bowel sounds present, soft, non tender Musculoskeletal- able to move all 4 extremities, no leg edema, generalized weakness Neurological- no focal deficit Skin- warm and dry, easy bruising, skin tear  Psychiatry- pleasantly confused but calm this visit   Labs reviewed: Basic Metabolic Panel:  Recent Labs  03/30/14 1133  07/17/14 0411   08/06/14 0956 08/07/14 0315 08/09/14 0640 08/09/14 1352  NA  --   < > 140  < > 139 141 136  --   K  --   < > 3.4*  < > 4.5 4.1 3.8  --   CL  --   < > 100  < > 103 105 106  --   CO2  --   < > 29  < > 27 27 26   --   GLUCOSE  --   < > 97  < > 111* 117* 83  --   BUN  --   < > 11  < > 9 13 14   --   CREATININE  --   < > 0.91  < > 0.93 1.08 0.93  --   CALCIUM  --   < > 8.9  < > 9.3 9.0 8.5  --   MG 2.0  --  2.0  --   --   --   --  2.1  < > = values in this interval not displayed. Liver Function Tests:  Recent Labs  03/29/14 0415 07/16/14 1219 08/09/14 0640  AST 36 21 19  ALT 21 9 15   ALKPHOS 105 114 106  BILITOT 0.6 0.8 0.8  PROT 7.0 8.1 7.5  ALBUMIN 2.6* 3.3* 2.9*    Recent Labs  03/28/14 0840  LIPASE 42   No results for input(s): AMMONIA in the last 8760 hours. CBC:  Recent Labs  03/28/14 0840  07/16/14 1219  08/08/14 0228 08/09/14 0640 08/10/14 0458  WBC 12.2*  < > 6.1  < > 7.5 7.3 6.1  NEUTROABS 9.8*  --  4.2  --   --   --   --   HGB 13.3  < > 13.3  < > 12.5 13.5 11.7*  HCT 40.9  < > 42.2  < > 39.4 43.0 36.8  MCV 88.5  < > 91.3  < > 93.1 94.1 91.8  PLT 301  < > 196  < > 227 262 255  < > = values in this interval not displayed. Cardiac Enzymes:  Recent Labs  03/28/14 0840 03/30/14 1133  TROPONINI <0.30 <0.30   BNP: Invalid input(s): POCBNP CBG:  Recent Labs  03/11/14 1353 07/16/14 1158 07/19/14 0609  GLUCAP 96 71 97    Assessment/Plan  Physical deconditioning Acute PE, progressive dementia and her co-morbidities are contributing to this. Will have her work with physical therapy and occupational therapy team to help with gait training and muscle strengthening exercises.fall precautions. Skin care. Encourage to be out of bed.   Pulmonary embolism On coumadin with bridging lovenox. inr today 2.6 and inr yesterday was 1.6. Discontinue lovenox, change coumadin to 5 mg daily. Check inr 08/13/14. Continue o2 by nasal canula continuously for  now  Dementia with behavioral disturbance Persists, worsened since last visit, pleasantly confused. Monitor for skin tear, pressure  sores, fall and provide assistance with her ADLs. Monitor and encourage po intake. Calm this visit. Continue seroquel 12.5 mg daily.  Protein calorie malnutrition Poor po intake and thin built. Dietician consult for protein supplements pending. Fall precuations, encourage po intake, pressure ulcer prophylaxis.  Atrial fibrillation Continue coreg 12.5 mg bid for rate control, continue coumadin with goal inr 2-3  Hypothyroidism Continue Synthroid, stable  Depression With her dementia. continue celexa 10 mg daily for now  Chronic diastolic heart failure euvolemic on exam. Continue lasix and coreg current dosing. Monitor bmp and weight. Continue kcl.  Chronic Pleural Effusions Stable, monitor clinically  Breast cancer/lung nodule Continue Femara and f/u with dr Learta Codding  Hyperlipidemia Continue statin  Goals of care: short term rehabilitation   Labs/tests ordered: cbc, bmp, inr  Family/ staff Communication: reviewed care plan with patient and nursing supervisor    [name], MD  [facility] [phone] (Monday-Friday 8 am - 5 pm) [phone] (afterhours)

## 2014-08-18 ENCOUNTER — Ambulatory Visit: Payer: Medicare Other | Admitting: Oncology

## 2014-08-29 ENCOUNTER — Telehealth: Payer: Self-pay | Admitting: Oncology

## 2014-08-29 NOTE — Telephone Encounter (Signed)
Pt's daughter [name] called to r/s due to pt is in rehab and needs to r/s till after completion. [name] confirmed MD visit... KJ

## 2014-09-02 ENCOUNTER — Ambulatory Visit: Payer: Medicare Other | Admitting: Oncology

## 2014-09-06 ENCOUNTER — Encounter: Payer: Self-pay | Admitting: Adult Health

## 2014-09-06 ENCOUNTER — Non-Acute Institutional Stay (SKILLED_NURSING_FACILITY): Payer: Medicare Other | Admitting: Adult Health

## 2014-09-06 DIAGNOSIS — F03918 Unspecified dementia, unspecified severity, with other behavioral disturbance: Secondary | ICD-10-CM

## 2014-09-06 DIAGNOSIS — Z7901 Long term (current) use of anticoagulants: Secondary | ICD-10-CM

## 2014-09-06 DIAGNOSIS — E46 Unspecified protein-calorie malnutrition: Secondary | ICD-10-CM

## 2014-09-06 DIAGNOSIS — C50919 Malignant neoplasm of unspecified site of unspecified female breast: Secondary | ICD-10-CM

## 2014-09-06 DIAGNOSIS — I482 Chronic atrial fibrillation, unspecified: Secondary | ICD-10-CM

## 2014-09-06 DIAGNOSIS — E785 Hyperlipidemia, unspecified: Secondary | ICD-10-CM

## 2014-09-06 DIAGNOSIS — E039 Hypothyroidism, unspecified: Secondary | ICD-10-CM

## 2014-09-06 DIAGNOSIS — I5032 Chronic diastolic (congestive) heart failure: Secondary | ICD-10-CM

## 2014-09-06 DIAGNOSIS — F0391 Unspecified dementia with behavioral disturbance: Secondary | ICD-10-CM

## 2014-09-06 DIAGNOSIS — R5381 Other malaise: Secondary | ICD-10-CM

## 2014-09-06 DIAGNOSIS — E876 Hypokalemia: Secondary | ICD-10-CM

## 2014-09-06 DIAGNOSIS — I2699 Other pulmonary embolism without acute cor pulmonale: Secondary | ICD-10-CM

## 2014-09-06 DIAGNOSIS — F32A Depression, unspecified: Secondary | ICD-10-CM

## 2014-09-06 DIAGNOSIS — F329 Major depressive disorder, single episode, unspecified: Secondary | ICD-10-CM

## 2014-09-06 NOTE — Progress Notes (Signed)
Patient ID: Leah Guerra, female   DOB: 01/18/1925, 81 y.o.   MRN: [id number]   09/06/2014  Facility:  Nursing Home Location:  [place] Room Number: [phone] LEVEL OF CARE:  SNF (31)   Chief Complaint  Patient presents with  . Discharge Note    Physical deconditioning, pulmonary embolism, chronic atrial fibrillation, chronic diastolic heart failure, hypothyroidism, hypokalemia, rest cancer, hyperlipidemia, depression, dementia and protein calorie malnutrition    HISTORY OF PRESENT ILLNESS:  This is an 81 year old who is for discharge to ALF with Home health PT for endurance and OT for ADLs. She was about to be discharged  home but had shortness of breath at rest and was sent to the hospital. She has PMH of chronic atrial fibrillation, chronic diastolic heart failure, breast cancer and hyperlipidemia. Coumadin was recently discontinued due to falls. She was sent to the hospital and was diagnosed with bilateral pulmonary emboli. She has been readmitted to [facility] on 08/10/14 from [facility]. Patient was admitted to this facility for short-term rehabilitation after the patient's recent hospitalization.  Patient has completed SNF rehabilitation and therapy has cleared the patient for discharge.  PAST MEDICAL HISTORY:  Past Medical History  Diagnosis Date  . Pleural effusion   . Hypertension   . CHF (congestive heart failure)   . Atrial fibrillation   . Hypothyroid   . Breast cancer   . Lung nodule   . Hyperlipidemia     CURRENT MEDICATIONS: Reviewed per MAR/see medication list  No Known Allergies   REVIEW OF SYSTEMS: unobtainable; patient is not a good historian due to dementia  PHYSICAL EXAMINATION  GENERAL: no acute distress, normal body habitus NECK: supple, trachea midline, no neck masses, no thyroid tenderness, no thyromegaly LYMPHATICS: no LAN in the neck, no supraclavicular LAN RESPIRATORY: breathing is even & unlabored,  BS CTAB CARDIAC: Irregularly irregular, no murmur,no extra heart sounds, no edema GI: abdomen soft, normal BS, no masses, no tenderness, no hepatomegaly, no splenomegaly EXTREMITIES: Able to move all 4 extremities PSYCHIATRIC: the patient is alert & oriented to person, affect & behavior appropriate  LABS/RADIOLOGY: 08/22/14  WBC 6.8 hemoglobin 13.7 hematocrit 44.5 MCV 94.1 sodium 140 potassium 4.1 glucose 155 BUN 29 creatinine 1.0 calcium 8.9 GFR 59.02 08/04/13  NA 139 K 4.1  Glucose 94  BUN 13  Creatinine 0.8  CA 9.0  GFR >60 Labs reviewed: Basic Metabolic Panel:  Recent Labs  03/30/14 1133  07/17/14 0411  08/06/14 0956 08/07/14 0315 08/09/14 0640 08/09/14 1352  NA  --   < > 140  < > 139 141 136  --   K  --   < > 3.4*  < > 4.5 4.1 3.8  --   CL  --   < > 100  < > 103 105 106  --   CO2  --   < > 29  < > 27 27 26   --   GLUCOSE  --   < > 97  < > 111* 117* 83  --   BUN  --   < > 11  < > 9 13 14   --   CREATININE  --   < > 0.91  < > 0.93 1.08 0.93  --   CALCIUM  --   < > 8.9  < > 9.3 9.0 8.5  --   MG 2.0  --  2.0  --   --   --   --  2.1  < > = values in this interval not displayed. Liver Function Tests:  Recent Labs  03/29/14 0415 07/16/14 1219 08/09/14 0640  AST 36 21 19  ALT 21 9 15   ALKPHOS 105 114 106  BILITOT 0.6 0.8 0.8  PROT 7.0 8.1 7.5  ALBUMIN 2.6* 3.3* 2.9*    Recent Labs  03/28/14 0840  LIPASE 42   CBC:  Recent Labs  03/28/14 0840  07/16/14 1219  08/08/14 0228 08/09/14 0640 08/10/14 0458  WBC 12.2*  < > 6.1  < > 7.5 7.3 6.1  NEUTROABS 9.8*  --  4.2  --   --   --   --   HGB 13.3  < > 13.3  < > 12.5 13.5 11.7*  HCT 40.9  < > 42.2  < > 39.4 43.0 36.8  MCV 88.5  < > 91.3  < > 93.1 94.1 91.8  PLT 301  < > 196  < > 227 262 255  < > = values in this interval not displayed.  Cardiac Enzymes:  Recent Labs  03/28/14 0840 03/30/14 1133  TROPONINI <0.30 <0.30   CBG:  Recent Labs  03/11/14 1353 07/16/14 1158 07/19/14 0609  GLUCAP 96 71 97     No results found.  ASSESSMENT/PLAN:  Physical deconditioning - for home health PT and OT Bilateral pulmonary emboli - continue Coumadin 4.5 mg PO Q D Long-term use of anticoagulant - INR 2.8, therapeutic; continue Coumadin 4.5 mg PO Q D; check INR 09/13/14 Chronic atrial fibrillation - rate controlled; continue Coreg 3.125 mg by mouth twice a day and Coumadin Chronic diastolic heart failure - continue Lasix 40 mg by mouth daily Hypothyroidism - continue Synthroid 125 g by mouth daily Hypokalemia -  Continue K-Dur 20 meq daily Breast cancer - continue Femara 2.5 mg PO Q D Hyperlipidemia -  Continue Lipitor 40 mg by mouth daily  Depression - continue Celexa 10 mg by mouth daily and Seroquel 12.5 mg by mouth daily at bedtime Dementia with behavioral disturbance - stable; continue Seroquel 12.5 mg PO Q HS Protein-Calorie malnutrition, severe - albumin 2.9; continue supplementation   I have filled out patient's discharge paperwork and written prescriptions.  Patient will receive home health PT and OT.  Total discharge time: Greater than 30 minutes  Discharge time involved coordination of the discharge process with social worker, nursing staff and therapy department. Medical justification for home health services verified.    [facility], NP [employer] [phone]

## 2014-09-27 ENCOUNTER — Ambulatory Visit: Payer: Self-pay | Admitting: Pharmacist Clinician (PhC)/ Clinical Pharmacy Specialist

## 2014-09-27 DIAGNOSIS — I482 Chronic atrial fibrillation, unspecified: Secondary | ICD-10-CM

## 2014-09-27 DIAGNOSIS — Z7901 Long term (current) use of anticoagulants: Secondary | ICD-10-CM

## 2014-10-03 ENCOUNTER — Ambulatory Visit (HOSPITAL_BASED_OUTPATIENT_CLINIC_OR_DEPARTMENT_OTHER): Payer: Medicare Other | Admitting: Oncology

## 2014-10-03 ENCOUNTER — Telehealth: Payer: Self-pay | Admitting: Oncology

## 2014-10-03 VITALS — BP 140/83 | HR 69 | Temp 97.6°F | Resp 17 | Ht 64.0 in | Wt 127.6 lb

## 2014-10-03 DIAGNOSIS — R911 Solitary pulmonary nodule: Secondary | ICD-10-CM

## 2014-10-03 DIAGNOSIS — C50919 Malignant neoplasm of unspecified site of unspecified female breast: Secondary | ICD-10-CM

## 2014-10-03 DIAGNOSIS — I4891 Unspecified atrial fibrillation: Secondary | ICD-10-CM

## 2014-10-03 NOTE — Telephone Encounter (Signed)
Pt confirmed MD visit per 03/07 POF, gave pt AVS... KJ

## 2014-10-03 NOTE — Progress Notes (Signed)
[place] OFFICE PROGRESS NOTE   Diagnosis: Breast cancer  INTERVAL HISTORY:   [name] returns as scheduled. She is accompanied by her daughter. She is now living in a nursing facility. She continues Femara. [name] was admitted in January with dyspnea and found to have bilateral pulmonary embolism. She had been taken off of Coumadin secondary to a fall in December. Coumadin has been resumed.  The daughter reports [name] has a good appetite and is ambulating. No specific complaint today.  Objective:  Vital signs in last 24 hours:  Blood pressure 140/83, pulse 69, temperature 97.6 F (36.4 C), temperature source Oral, resp. rate 17, height 5' 4"  (1.626 m), weight 127 lb 9.6 oz (57.879 kg), SpO2 95 %.    HEENT: Neck without mass Lymphatics: No cervical or supraclavicular nodes. 1/2 cm mobile right axillary node. Resp: Decreased breath sounds at the lower chest bilaterally, inspiratory rub at the left upper posterior chest, no respiratory distress Cardio: Irregular GI: No hepatosplenomegaly Vascular: No leg edema Neuro: Alert, not oriented    Breast: Status post post left mastectomy, no evidence for chest wall tumor recurrence   Portacath/PICC-without erythema  Lab Results:   Medications: I have reviewed the patient's current medications.  Assessment/Plan: 1. Stage III-A left-sided breast cancer diagnosed in February of 2000. She was maintained on Femara from February 2005 through November 2008. 2. History of chronic parenchymal lung densities and a borderline elevated CA27.29. Multiple CT scans were negative for evidence of disease progression.  CT of the chest on 02/11/2013 showed a stable right lung nodule.   CT chest 10/28/2013 showed an enlarging spiculated right lower lobe pulmonary nodule; stable mild mediastinal lymphadenopathy; new mild patchy subpleural opacities in the right middle and lower lobes felt likely to be  infectious/inflammatory; small bilateral pleural effusions which were overall stable.   CT chest 02/28/2014 with increase in the size of the right lower lobe nodule; interval increase in size and number of perilymphatic micro-and macro nodules throughout the right lung; worsening of mediastinal adenopathy; slight decrease in small left pleural effusion; small right pleural effusion unchanged.  PET scan 03/11/2014 with malignant level activity involving right-sided pulmonary nodules, right pleural nodularity, mediastinal, and right hilar adenopathy there was also evidence of metastatic gastric neck ligament and periaortic adenopathy  CT guided biopsy of a right subpleural nodule 03/24/2014 with pathology confirming metastatic carcinoma-ER positive, PR positive, HER-2/neu negative.  Initiation of Femara 04/12/2014.  Chest x-ray 05/30/2014 with a persistent pulmonary nodule on the right and unchanged small bilateral pleural effusions.  Chest CT 08/06/2014 with less conspicuous nodular opacities in the right middle and lower lobes, less dense spiculated nodule in the right lower lobe, progressive bilateral effusions  3. Left pleural effusion-pleural fluid cytology negative while in [place] in June 2014, status post a diagnostic/therapeutic thoracentesis 02/09/2013-negative for malignancy. 4. Atrial fibrillation. 5. Status post a fall with a left hip fracture June 2014. 6. Memory loss-progressive 7. Hospital admission with an ileus 03/28/2014 8. admission with bilateral pulmonary embolism 08/06/2014    Disposition:   [name] has metastatic breast cancer. She is maintained on Femara. There is no clinical evidence of disease progression. A chest CT in January suggested improvement in nodular opacities in the right lung. She will return for an office visit and chest x-ray in 4 months. Her chief clinical problem is dementia. She is now living in a nursing facility.  Betsy Coder,  MD  10/03/2014  12:43 PM

## 2014-12-09 ENCOUNTER — Telehealth: Payer: Self-pay | Admitting: Nurse Practitioner

## 2014-12-09 NOTE — Telephone Encounter (Signed)
Pt's daughter [name] called to r/s from 07/07 due pt will be out of town, confirmed ML visit for the following week.Marland Kitchen... KJ

## 2015-01-27 DEATH — deceased

## 2015-02-02 ENCOUNTER — Ambulatory Visit: Payer: Medicare Other | Admitting: Nurse Practitioner

## 2015-02-06 ENCOUNTER — Telehealth: Payer: Self-pay | Admitting: Oncology

## 2015-02-06 NOTE — Telephone Encounter (Signed)
Pt's daughter called to advise [place] that pt has passed away, cancelled all visits and sent msg to MD/ML... KJ

## 2015-02-08 ENCOUNTER — Ambulatory Visit: Payer: Medicare Other | Admitting: Nurse Practitioner

## 2015-07-31 IMAGING — CR DG CHEST 2V
2 series · 2 of 2 positions shown · non-contrast
Comparison: 03/24/2014

CLINICAL DATA: Generalized weakness, fatigue, loss of appetite,
abdominal pain, lung nodule, history hypertension, CHF, breast
cancer

EXAM:
CHEST  2 VIEW

[w chest pa]
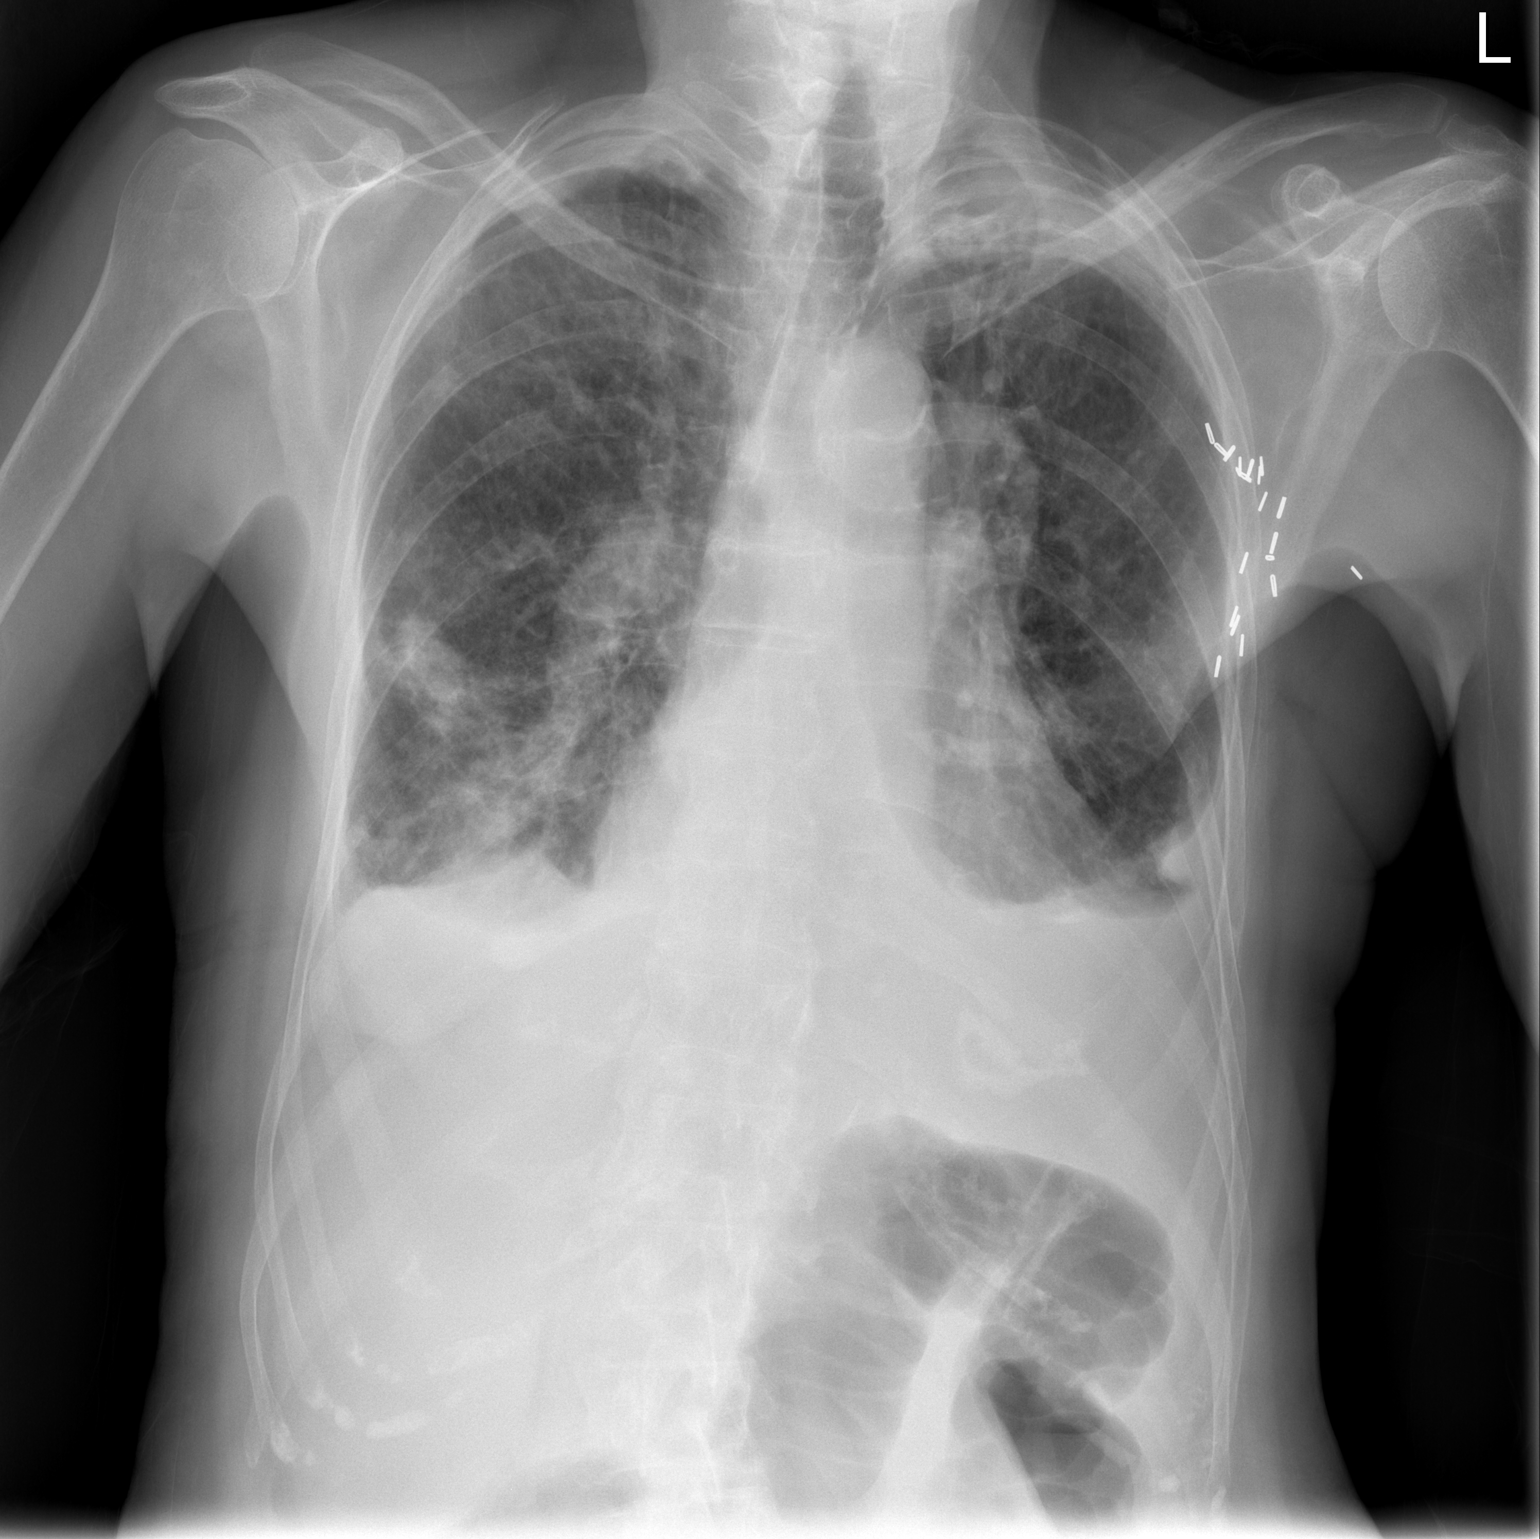

[w chest lat]
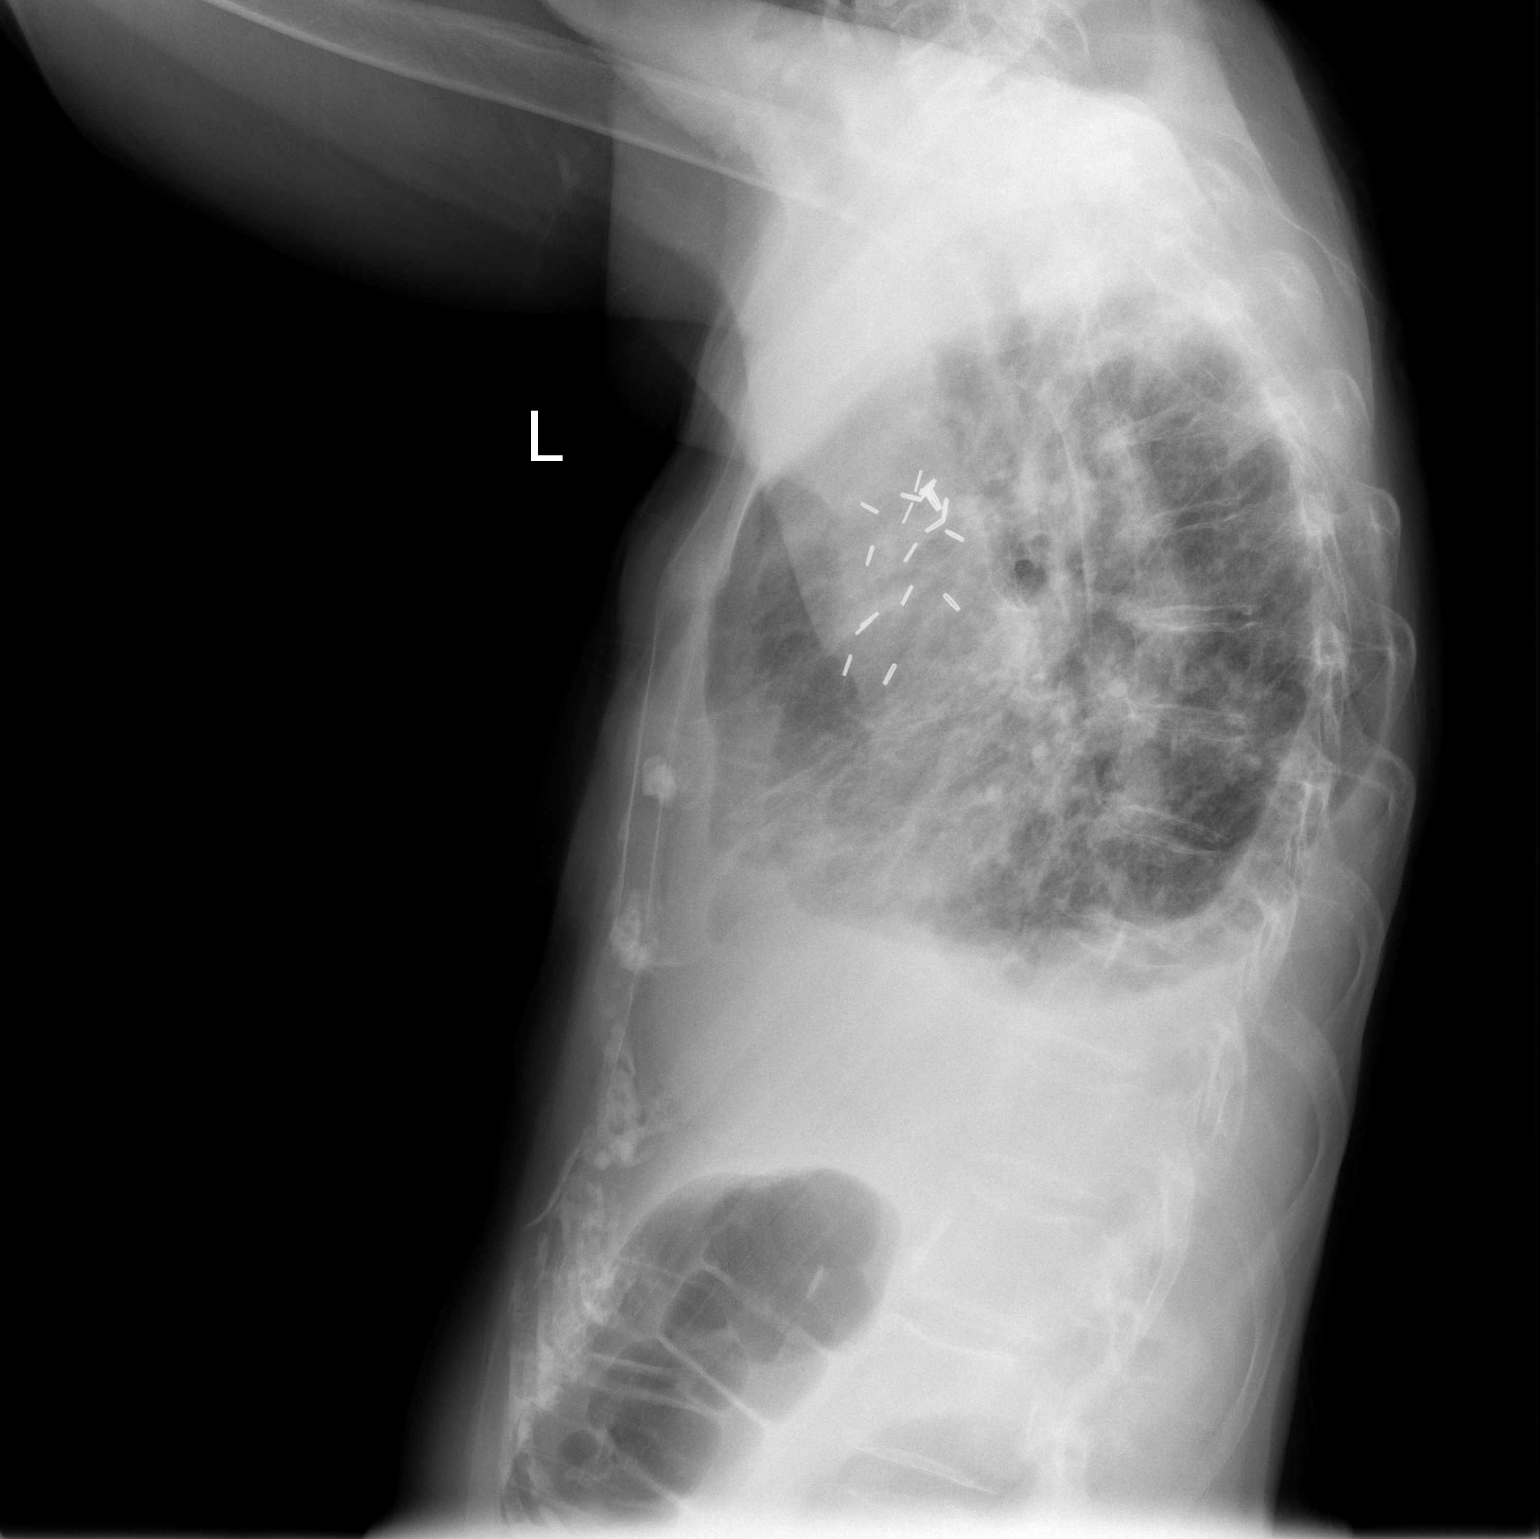

[2 of 2 positions shown; findings below may reference images not displayed]

FINDINGS: Enlargement of cardiac silhouette.

Atherosclerotic calcification aorta.

BILATERAL upper lobe volume loss and scarring with superior
retraction of the hila.

Slight prominence of central pulmonary arteries question pulmonary
hypertension.

Severe COPD changes with bibasilar effusions atelectasis.

Nodular focus in lateral lower RIGHT chest proximally 2.8 cm in
greatest size unchanged.

No definite acute infiltrate or pneumothorax.

Bones demineralized.
IMPRESSION: Severe COPD changes with BILATERAL upper lobe scarring/volume loss,
bibasilar pleural effusions and bibasilar atelectasis.

Persistent nodular density lower lateral RIGHT chest.

Enlargement of cardiac silhouette.

No new abnormalities.

## 2015-11-18 IMAGING — CR DG CHEST 1V
1 series · 1 of 1 positions shown · non-contrast
Comparison: 05/30/2014, CT 8 [DATE], PET-CT 03/11/2014

CLINICAL DATA: Fall, congestive heart failure

EXAM:
CHEST - 1 VIEW

[chest ap]
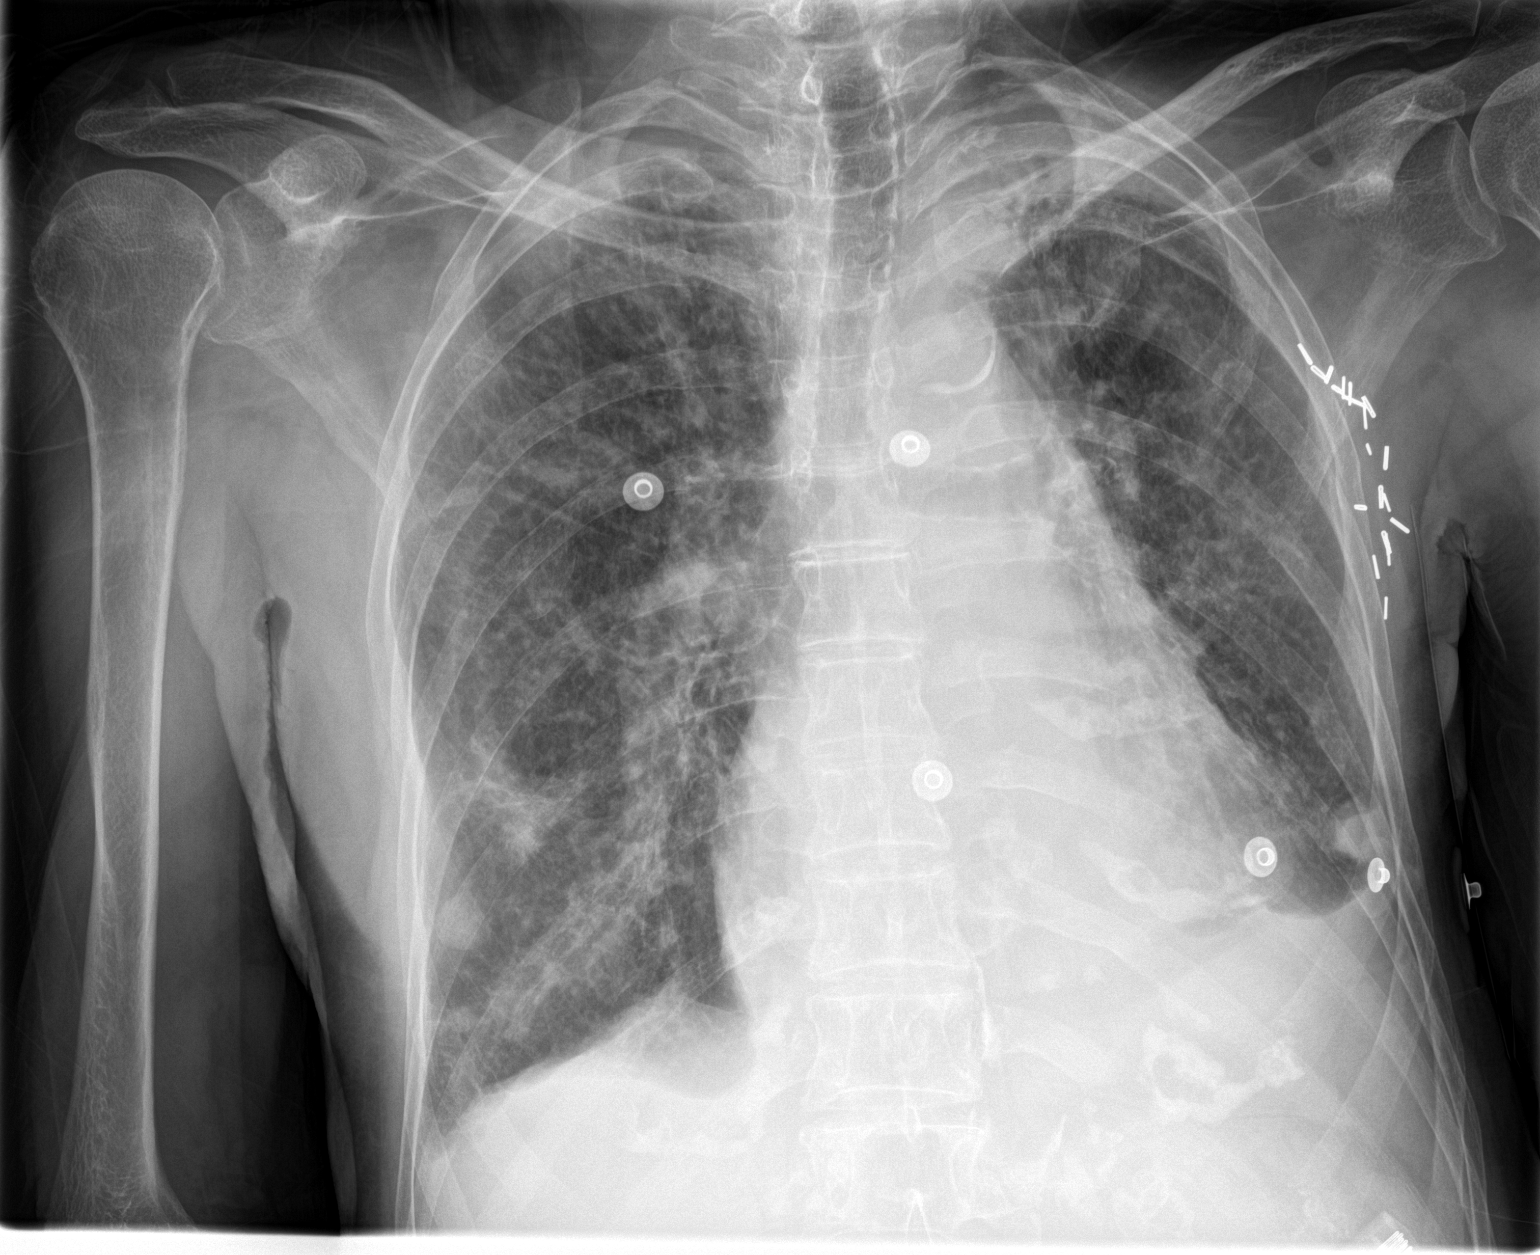

[1 of 1 positions shown; findings below may reference images not displayed]

FINDINGS: Stable enlarged cardiac silhouette. There are bilateral pleural
effusions not changed. There is chronic interstitial lung disease.
There is increase in linear opacities the left right lung. There is
nodularity in the right lower lobe which is not changed. These
correspond to nodule described on comparison CT of 02/28/2014 and
PET-CT . Stable consolidation at the left lung apex.
IMPRESSION: 1. Interval increased interstitial edema.
2. Stable cardiomegaly and bilateral pleural effusions.
3. Stable nodularity in the right lower lobe and consolidation in
the left apex

## 2018-03-11 ENCOUNTER — Encounter: Payer: Self-pay | Admitting: Internal Medicine
